# Patient Record
Sex: Female | Born: 1955 | Race: White | Hispanic: No | Smoking: Former smoker
Health system: Southern US, Community
[De-identification: ages and names within clinical notes are randomized; demographics above are authoritative.]

## PROBLEM LIST (undated history)

## (undated) ENCOUNTER — Ambulatory Visit: Admission: EM | Payer: Medicare PPO

## (undated) DIAGNOSIS — T8859XA Other complications of anesthesia, initial encounter: Secondary | ICD-10-CM

## (undated) DIAGNOSIS — Z9889 Other specified postprocedural states: Secondary | ICD-10-CM

## (undated) DIAGNOSIS — E785 Hyperlipidemia, unspecified: Secondary | ICD-10-CM

## (undated) DIAGNOSIS — I10 Essential (primary) hypertension: Secondary | ICD-10-CM

## (undated) DIAGNOSIS — R519 Headache, unspecified: Secondary | ICD-10-CM

## (undated) DIAGNOSIS — K219 Gastro-esophageal reflux disease without esophagitis: Secondary | ICD-10-CM

## (undated) DIAGNOSIS — K589 Irritable bowel syndrome without diarrhea: Secondary | ICD-10-CM

## (undated) DIAGNOSIS — C439 Malignant melanoma of skin, unspecified: Secondary | ICD-10-CM

## (undated) DIAGNOSIS — T4145XA Adverse effect of unspecified anesthetic, initial encounter: Secondary | ICD-10-CM

## (undated) DIAGNOSIS — F419 Anxiety disorder, unspecified: Secondary | ICD-10-CM

## (undated) DIAGNOSIS — C50919 Malignant neoplasm of unspecified site of unspecified female breast: Secondary | ICD-10-CM

## (undated) DIAGNOSIS — E559 Vitamin D deficiency, unspecified: Secondary | ICD-10-CM

## (undated) DIAGNOSIS — R112 Nausea with vomiting, unspecified: Secondary | ICD-10-CM

## (undated) DIAGNOSIS — R51 Headache: Secondary | ICD-10-CM

## (undated) HISTORY — PX: COLONOSCOPY: SHX174

## (undated) HISTORY — DX: Hyperlipidemia, unspecified: E78.5

## (undated) HISTORY — PX: WISDOM TOOTH EXTRACTION: SHX21

## (undated) HISTORY — PX: POLYPECTOMY: SHX149

## (undated) HISTORY — DX: Gastro-esophageal reflux disease without esophagitis: K21.9

## (undated) HISTORY — DX: Malignant melanoma of skin, unspecified: C43.9

## (undated) HISTORY — DX: Irritable bowel syndrome, unspecified: K58.9

## (undated) HISTORY — PX: BREAST SURGERY: SHX581

## (undated) HISTORY — DX: Vitamin D deficiency, unspecified: E55.9

## (undated) HISTORY — DX: Essential (primary) hypertension: I10

## (undated) HISTORY — DX: Anxiety disorder, unspecified: F41.9

---

## 1983-02-10 HISTORY — PX: TUBAL LIGATION: SHX77

## 1989-02-09 HISTORY — PX: OVARIAN CYST REMOVAL: SHX89

## 1990-02-09 HISTORY — PX: ABDOMINAL HYSTERECTOMY: SHX81

## 1993-02-09 HISTORY — PX: OTHER SURGICAL HISTORY: SHX169

## 2004-04-28 ENCOUNTER — Ambulatory Visit: Payer: Self-pay | Admitting: Internal Medicine

## 2007-02-10 HISTORY — PX: SEPTOPLASTY: SHX2393

## 2010-02-09 HISTORY — PX: MOHS SURGERY: SUR867

## 2010-07-31 ENCOUNTER — Ambulatory Visit: Payer: Self-pay

## 2011-08-04 ENCOUNTER — Ambulatory Visit: Payer: Self-pay

## 2012-06-08 ENCOUNTER — Ambulatory Visit (INDEPENDENT_AMBULATORY_CARE_PROVIDER_SITE_OTHER): Payer: BC Managed Care – PPO | Admitting: Internal Medicine

## 2012-06-08 ENCOUNTER — Encounter: Payer: Self-pay | Admitting: Internal Medicine

## 2012-06-08 VITALS — BP 120/80 | HR 76 | Temp 98.3°F | Ht 68.0 in | Wt 199.5 lb

## 2012-06-08 DIAGNOSIS — F411 Generalized anxiety disorder: Secondary | ICD-10-CM

## 2012-06-08 DIAGNOSIS — E7800 Pure hypercholesterolemia, unspecified: Secondary | ICD-10-CM

## 2012-06-08 DIAGNOSIS — E559 Vitamin D deficiency, unspecified: Secondary | ICD-10-CM

## 2012-06-08 DIAGNOSIS — F419 Anxiety disorder, unspecified: Secondary | ICD-10-CM

## 2012-06-08 DIAGNOSIS — I10 Essential (primary) hypertension: Secondary | ICD-10-CM

## 2012-06-08 DIAGNOSIS — C439 Malignant melanoma of skin, unspecified: Secondary | ICD-10-CM

## 2012-06-08 DIAGNOSIS — K219 Gastro-esophageal reflux disease without esophagitis: Secondary | ICD-10-CM

## 2012-06-08 DIAGNOSIS — Z1239 Encounter for other screening for malignant neoplasm of breast: Secondary | ICD-10-CM

## 2012-06-08 DIAGNOSIS — K589 Irritable bowel syndrome without diarrhea: Secondary | ICD-10-CM

## 2012-06-08 DIAGNOSIS — G4733 Obstructive sleep apnea (adult) (pediatric): Secondary | ICD-10-CM

## 2012-06-09 ENCOUNTER — Encounter: Payer: Self-pay | Admitting: Internal Medicine

## 2012-06-09 DIAGNOSIS — G4733 Obstructive sleep apnea (adult) (pediatric): Secondary | ICD-10-CM | POA: Insufficient documentation

## 2012-06-09 DIAGNOSIS — F419 Anxiety disorder, unspecified: Secondary | ICD-10-CM | POA: Insufficient documentation

## 2012-06-09 DIAGNOSIS — E559 Vitamin D deficiency, unspecified: Secondary | ICD-10-CM | POA: Insufficient documentation

## 2012-06-09 DIAGNOSIS — C439 Malignant melanoma of skin, unspecified: Secondary | ICD-10-CM | POA: Insufficient documentation

## 2012-06-09 DIAGNOSIS — K219 Gastro-esophageal reflux disease without esophagitis: Secondary | ICD-10-CM | POA: Insufficient documentation

## 2012-06-09 DIAGNOSIS — K589 Irritable bowel syndrome without diarrhea: Secondary | ICD-10-CM | POA: Insufficient documentation

## 2012-06-09 DIAGNOSIS — I10 Essential (primary) hypertension: Secondary | ICD-10-CM | POA: Insufficient documentation

## 2012-06-09 DIAGNOSIS — E7800 Pure hypercholesterolemia, unspecified: Secondary | ICD-10-CM | POA: Insufficient documentation

## 2012-06-09 NOTE — Assessment & Plan Note (Signed)
Unable to tolerate the mask.  Has tried multiple masks.  Discussed the need to avoid sleeping supine and avoid sedating medication.

## 2012-06-09 NOTE — Assessment & Plan Note (Signed)
Intermittent flares.  Colonoscopy due.  Follow. Can try align daily.

## 2012-06-09 NOTE — Assessment & Plan Note (Signed)
Low cholesterol diet and exercise.  Follow.   

## 2012-06-09 NOTE — Progress Notes (Signed)
Subjective:    Patient ID: Jordan Benson, female    DOB: 04-Sep-1955, 57 y.o.   MRN: [id number]  HPI 57 year old female with past history of hypertension, GERD, anxiety and sleep apnea.  She comes in today to follow up on these issues as well as to establish care.  Works at [employer].  Overall she feel she is doing well.  Tries to stay active.  No cardiac symptoms with increased activity or exertion.  Goes to water aerobics 2-3x/week.  Breathing stable.  Has IBS.  Intermittent flares.  No change.  Her flares - loose stool.  No problems with constipation.  Has sleep apnea.  Not able to tolerate the mask.  Has tried multiple mask.  States has been on Prozac for a long time.  Has done well on this medication.     Past Medical History  Diagnosis Date  . Melanoma     followed by Dr Adolphus Birchwood  . Hypertension   . Sleep apnea   . GERD (gastroesophageal reflux disease)   . IBS (irritable bowel syndrome)   . Anxiety     Outpatient Encounter Prescriptions as of 06/08/2012  Medication Sig Dispense Refill  . beta carotene w/minerals (OCUVITE) tablet Take 1 tablet by mouth daily.      . Calcium Carb-Cholecalciferol (CALCIUM + D3 PO) Take by mouth daily.      . enalapril (VASOTEC) 5 MG tablet Take 5 mg by mouth daily.      Marland Kitchen FLUoxetine (PROZAC) 20 MG capsule Take 20 mg by mouth daily.      Marland Kitchen triamterene-hydrochlorothiazide (MAXZIDE-25) 37.5-25 MG per tablet Take 1 tablet by mouth daily.      . valACYclovir (VALTREX) 1000 MG tablet Take 1,000 mg by mouth as needed.       No facility-administered encounter medications on file as of 06/08/2012.    Review of Systems Patient denies any headache, lightheadedness or dizziness.  No sinus or allergy symptoms.  No chest pain, tightness or palpitations.  No increased shortness of breath, cough or congestion.  Breathing stable.  No nausea or vomiting.  Acid reflux controlled.  No abdominal pain or cramping.  No bowel change.  Has intermittent flares of IBS as outlined.   No BRBPR or melana.  No urine change. Anxiety controlled with Prozac.  Has sleep apnea.  Not able to tolerate the mask.  Has tried multiple masks.  Has had surgery for deviated septum.         Objective:   Physical Exam Filed Vitals:   06/08/12 1024  BP: 120/80  Pulse: 76  Temp: 98.3 F (66.86 C)   57 year old female in no acute distress.   HEENT:  Nares- clear.  Oropharynx - without lesions. NECK:  Supple.  Nontender.  No audible bruit.  HEART:  Appears to be regular. LUNGS:  No crackles or wheezing audible.  Respirations even and unlabored.  RADIAL PULSE:  Equal bilaterally.    ABDOMEN:  Soft, nontender.  Bowel sounds present and normal.  No audible abdominal bruit.   EXTREMITIES:  No increased edema present.  DP pulses palpable and equal bilaterally.      SKIN:  No rash.       Assessment & Plan:  HEALTH MAINTENANCE.  Schedule a physical for next visit.  Had a colonoscopy at age 33.  Discussed need for f/u colonoscopy since her sister had pre cancerous polyps.  Should get a colonoscopy every five years.  Last mammogram 07/2011.  Schedule  follow up mammogram.    I spent 45 minutes with this patient and more than 50% of the time was spent in consultation regarding the above.

## 2012-06-09 NOTE — Assessment & Plan Note (Signed)
Blood pressure under good control.  On Enalapril.  Check metabolic panel.

## 2012-06-09 NOTE — Assessment & Plan Note (Signed)
Reflux controlled.  Follow.   

## 2012-06-09 NOTE — Assessment & Plan Note (Signed)
Continue vitamin D supplements.  Follow.   

## 2012-06-09 NOTE — Assessment & Plan Note (Signed)
Followed by Dr Dasher.  Doing well.  Follow.   

## 2012-06-09 NOTE — Assessment & Plan Note (Signed)
On prozac.  Doing well.  Follow.   

## 2012-07-15 ENCOUNTER — Telehealth: Payer: Self-pay | Admitting: Internal Medicine

## 2012-07-15 MED ORDER — FLUOXETINE HCL 20 MG PO CAPS: 20.0000 mg | ORAL_CAPSULE | Freq: Every day | ORAL | Status: DC

## 2012-07-15 MED ORDER — TRIAMTERENE-HCTZ 37.5-25 MG PO TABS: 1.0000 | ORAL_TABLET | Freq: Every day | ORAL | Status: DC

## 2012-07-15 MED ORDER — ENALAPRIL MALEATE 5 MG PO TABS: 5.0000 mg | ORAL_TABLET | Freq: Every day | ORAL | Status: DC

## 2012-07-15 NOTE — Telephone Encounter (Signed)
Pt aware that meds have been refilled & to call the pharmacy in the future to request refills in advance

## 2012-07-15 NOTE — Telephone Encounter (Signed)
Patient wants all 3 of her prescriptions sent to [facility] pharmacy . She stated that the Dr. Ronnald Nian which prescriptions needed to be refilled.

## 2012-09-07 ENCOUNTER — Ambulatory Visit: Payer: Self-pay | Admitting: Internal Medicine

## 2012-09-09 ENCOUNTER — Encounter: Payer: BC Managed Care – PPO | Admitting: Internal Medicine

## 2012-09-11 ENCOUNTER — Encounter: Payer: Self-pay | Admitting: Internal Medicine

## 2012-09-19 ENCOUNTER — Encounter: Payer: Self-pay | Admitting: Internal Medicine

## 2012-09-20 ENCOUNTER — Encounter: Payer: BC Managed Care – PPO | Admitting: Internal Medicine

## 2012-09-27 ENCOUNTER — Encounter: Payer: Self-pay | Admitting: Internal Medicine

## 2012-09-27 ENCOUNTER — Ambulatory Visit (INDEPENDENT_AMBULATORY_CARE_PROVIDER_SITE_OTHER): Payer: BC Managed Care – PPO | Admitting: Internal Medicine

## 2012-09-27 VITALS — BP 118/80 | HR 75 | Temp 98.0°F | Ht 68.1 in | Wt 197.2 lb

## 2012-09-27 DIAGNOSIS — E559 Vitamin D deficiency, unspecified: Secondary | ICD-10-CM

## 2012-09-27 DIAGNOSIS — Z8371 Family history of colonic polyps: Secondary | ICD-10-CM

## 2012-09-27 DIAGNOSIS — Z1211 Encounter for screening for malignant neoplasm of colon: Secondary | ICD-10-CM

## 2012-09-27 DIAGNOSIS — K219 Gastro-esophageal reflux disease without esophagitis: Secondary | ICD-10-CM

## 2012-09-27 DIAGNOSIS — F411 Generalized anxiety disorder: Secondary | ICD-10-CM

## 2012-09-27 DIAGNOSIS — K589 Irritable bowel syndrome without diarrhea: Secondary | ICD-10-CM

## 2012-09-27 DIAGNOSIS — F419 Anxiety disorder, unspecified: Secondary | ICD-10-CM

## 2012-09-27 DIAGNOSIS — C439 Malignant melanoma of skin, unspecified: Secondary | ICD-10-CM

## 2012-09-27 DIAGNOSIS — I10 Essential (primary) hypertension: Secondary | ICD-10-CM

## 2012-09-27 DIAGNOSIS — G4733 Obstructive sleep apnea (adult) (pediatric): Secondary | ICD-10-CM

## 2012-09-27 DIAGNOSIS — E7800 Pure hypercholesterolemia, unspecified: Secondary | ICD-10-CM

## 2012-09-27 NOTE — Assessment & Plan Note (Signed)
Low cholesterol diet and exercise.  Follow.   

## 2012-09-27 NOTE — Assessment & Plan Note (Signed)
Followed by Dr Dasher.  Doing well.  Follow.   

## 2012-09-27 NOTE — Assessment & Plan Note (Signed)
Unable to tolerate the mask.  Has tried multiple masks.  Discussed the need to avoid sleeping supine and avoid sedating medication.

## 2012-09-27 NOTE — Progress Notes (Signed)
Subjective:    Patient ID: Jordan Benson, female    DOB: Oct 06, 1955, 57 y.o.   MRN: [id number]  HPI 57 year old female with past history of hypertension, GERD, anxiety and sleep apnea.  She comes in today to follow up on these issues as well as for a complete physical exam.  Works at [employer].  Overall she feel she is doing well.  Tries to stay active.  No cardiac symptoms with increased activity or exertion.  Goes to water aerobics 2-3x/week.  Breathing stable.  Has IBS.  Intermittent flares.  No change.  Her flares - loose stool.  No problems with constipation.  Has sleep apnea.  Not able to tolerate the mask.  Has tried multiple masks.  States has been on Prozac for a long time.  Has done well on this medication.  Overall she feels she is doing well.     Past Medical History  Diagnosis Date  . Melanoma     followed by Dr Adolphus Birchwood  . Hypertension   . Sleep apnea   . GERD (gastroesophageal reflux disease)   . IBS (irritable bowel syndrome)   . Anxiety   . Vitamin D deficiency     Outpatient Encounter Prescriptions as of 09/27/2012  Medication Sig Dispense Refill  . beta carotene w/minerals (OCUVITE) tablet Take 1 tablet by mouth daily.      . Calcium Carb-Cholecalciferol (CALCIUM + D3 PO) Take by mouth daily.      . enalapril (VASOTEC) 5 MG tablet Take 1 tablet (5 mg total) by mouth daily.  30 tablet  5  . FLUoxetine (PROZAC) 20 MG capsule Take 1 capsule (20 mg total) by mouth daily.  30 capsule  2  . triamterene-hydrochlorothiazide (MAXZIDE-25) 37.5-25 MG per tablet Take 1 tablet by mouth daily.  30 tablet  5  . valACYclovir (VALTREX) 1000 MG tablet Take 1,000 mg by mouth as needed.       No facility-administered encounter medications on file as of 09/27/2012.    Review of Systems Patient denies any headache, lightheadedness or dizziness.  No sinus or allergy symptoms.  No chest pain, tightness or palpitations.  No increased shortness of breath, cough or congestion.  Breathing stable.   No nausea or vomiting.  Acid reflux controlled.  No abdominal pain or cramping.  No bowel change.  Has intermittent flares of IBS as outlined.  Overall feels bowels stable.  Discussed the need for colonoscopy.  No BRBPR or melana.  No urine change. Anxiety controlled with Prozac.  Has sleep apnea.  Not able to tolerate the mask.  Has tried multiple masks.  Has had surgery for deviated septum.         Objective:   Physical Exam  Filed Vitals:   09/27/12 0805  BP: 118/80  Pulse: 75  Temp: 98 F (36.7 C)   Blood pressure recheck:  60/38  57 year old female in no acute distress.   HEENT:  Nares- clear.  Oropharynx - without lesions. NECK:  Supple.  Nontender.  No audible bruit.  HEART:  Appears to be regular. LUNGS:  No crackles or wheezing audible.  Respirations even and unlabored.  RADIAL PULSE:  Equal bilaterally.    BREASTS:  No nipple discharge or nipple retraction present.  Could not appreciate any distinct nodules or axillary adenopathy.  ABDOMEN:  Soft, nontender.  Bowel sounds present and normal.  No audible abdominal bruit.  GU:  Is s/p hysterectomy.  Not performed.   EXTREMITIES:  No  increased edema present.  DP pulses palpable and equal bilaterally.          Assessment & Plan:  HEALTH MAINTENANCE.  Physical today.   Had a colonoscopy at age 19.  Discussed need for f/u colonoscopy since her sister had pre cancerous polyps.  Should get a colonoscopy every five years.  Discussed with her today.  She will notify me when agreeable.  Last mammogram 09/07/12 - Birads II.

## 2012-09-27 NOTE — Assessment & Plan Note (Signed)
Blood pressure under good control.  On Enalapril.  Check metabolic panel.

## 2012-09-27 NOTE — Assessment & Plan Note (Signed)
Reflux controlled.  Follow.   

## 2012-09-27 NOTE — Assessment & Plan Note (Signed)
Continue vitamin D supplements.  Follow.   

## 2012-09-29 ENCOUNTER — Encounter: Payer: Self-pay | Admitting: Internal Medicine

## 2012-09-29 DIAGNOSIS — Z8371 Family history of colonic polyps: Secondary | ICD-10-CM | POA: Insufficient documentation

## 2012-09-29 MED ORDER — TRIAMTERENE-HCTZ 37.5-25 MG PO TABS: 1.0000 | ORAL_TABLET | Freq: Every day | ORAL | Status: DC

## 2012-09-29 MED ORDER — FLUOXETINE HCL 20 MG PO CAPS: 20.0000 mg | ORAL_CAPSULE | Freq: Every day | ORAL | Status: DC

## 2012-09-29 MED ORDER — ENALAPRIL MALEATE 5 MG PO TABS: 5.0000 mg | ORAL_TABLET | Freq: Every day | ORAL | Status: DC

## 2012-09-29 NOTE — Assessment & Plan Note (Addendum)
Had a colonoscopy at age 57.  Discussed the need for colonoscopy q five years - given family history.  Will notify me when agreeable.  IFOB given today.

## 2012-09-29 NOTE — Assessment & Plan Note (Signed)
Controlled.  Stable.  On Prozac.

## 2012-09-29 NOTE — Assessment & Plan Note (Signed)
Bowels stable.  

## 2012-10-06 ENCOUNTER — Other Ambulatory Visit (INDEPENDENT_AMBULATORY_CARE_PROVIDER_SITE_OTHER): Payer: BC Managed Care – PPO

## 2012-10-06 DIAGNOSIS — Z1211 Encounter for screening for malignant neoplasm of colon: Secondary | ICD-10-CM

## 2012-12-15 ENCOUNTER — Other Ambulatory Visit: Payer: Self-pay

## 2013-03-31 ENCOUNTER — Ambulatory Visit: Payer: BC Managed Care – PPO | Admitting: Internal Medicine

## 2013-04-05 ENCOUNTER — Ambulatory Visit: Payer: BC Managed Care – PPO | Admitting: Internal Medicine

## 2013-04-26 ENCOUNTER — Encounter: Payer: Self-pay | Admitting: Internal Medicine

## 2013-04-26 ENCOUNTER — Ambulatory Visit (INDEPENDENT_AMBULATORY_CARE_PROVIDER_SITE_OTHER): Payer: BC Managed Care – PPO | Admitting: Internal Medicine

## 2013-04-26 VITALS — BP 120/80 | HR 75 | Temp 98.3°F | Ht 68.1 in | Wt 198.2 lb

## 2013-04-26 DIAGNOSIS — F411 Generalized anxiety disorder: Secondary | ICD-10-CM

## 2013-04-26 DIAGNOSIS — E559 Vitamin D deficiency, unspecified: Secondary | ICD-10-CM

## 2013-04-26 DIAGNOSIS — K219 Gastro-esophageal reflux disease without esophagitis: Secondary | ICD-10-CM

## 2013-04-26 DIAGNOSIS — F419 Anxiety disorder, unspecified: Secondary | ICD-10-CM

## 2013-04-26 DIAGNOSIS — C439 Malignant melanoma of skin, unspecified: Secondary | ICD-10-CM

## 2013-04-26 DIAGNOSIS — I10 Essential (primary) hypertension: Secondary | ICD-10-CM

## 2013-04-26 DIAGNOSIS — E7800 Pure hypercholesterolemia, unspecified: Secondary | ICD-10-CM

## 2013-04-26 DIAGNOSIS — Z8371 Family history of colonic polyps: Secondary | ICD-10-CM

## 2013-04-26 DIAGNOSIS — G4733 Obstructive sleep apnea (adult) (pediatric): Secondary | ICD-10-CM

## 2013-04-26 MED ORDER — ALPRAZOLAM 0.25 MG PO TABS: 0.2500 mg | ORAL_TABLET | Freq: Every day | ORAL | Status: DC | PRN

## 2013-04-26 NOTE — Assessment & Plan Note (Addendum)
Blood pressure under good control.  On Enalapril.  Follow metabolic panel.   

## 2013-04-26 NOTE — Progress Notes (Signed)
Subjective:    Patient ID: Jordan Benson, female    DOB: [birth year], 58 y.o.   MRN: [id number]  HPI 58 year old female with past history of hypertension, GERD, anxiety and sleep apnea.  She comes in today for a scheduled follow up.  Works at [employer].  Overall she feel she is doing well.  Tries to stay active.  No cardiac symptoms with increased activity or exertion.  Goes to water aerobics 2-3x/week.  Breathing stable.  Has IBS.  Intermittent flares.  No change.  No problems with constipation.  Has sleep apnea.  Not able to tolerate the mask previously.   Has tried multiple masks.  Is planning for another sleep study next month.  She is more motivated to try the mask again.  Under increased stress with her husbands health issues.  States has been on Prozac for a long time.  Has done well on this medication.  Overall she feels she is handling things relatively well.  Feels she needs something to help intermittently calm herself down.   Has taken alprazolam in the past.     Past Medical History  Diagnosis Date  . Melanoma     followed by Dr Evorn Gong  . Hypertension   . Sleep apnea   . GERD (gastroesophageal reflux disease)   . IBS (irritable bowel syndrome)   . Anxiety   . Vitamin D deficiency     Outpatient Encounter Prescriptions as of 04/26/2013  Medication Sig  . beta carotene w/minerals (OCUVITE) tablet Take 1 tablet by mouth daily.  . Calcium Carb-Cholecalciferol (CALCIUM + D3 PO) Take by mouth daily.  . enalapril (VASOTEC) 5 MG tablet Take 1 tablet (5 mg total) by mouth daily.  Marland Kitchen FLUoxetine (PROZAC) 20 MG capsule Take 1 capsule (20 mg total) by mouth daily.  Marland Kitchen triamterene-hydrochlorothiazide (MAXZIDE-25) 37.5-25 MG per tablet Take 1 tablet by mouth daily.  . valACYclovir (VALTREX) 1000 MG tablet Take 1,000 mg by mouth as needed.    Review of Systems Patient denies any headache, lightheadedness or dizziness.  No sinus or allergy symptoms.  No chest pain, tightness or palpitations.  No  increased shortness of breath, cough or congestion.  Breathing stable.  No nausea or vomiting.  Acid reflux controlled.  No abdominal pain or cramping.  No bowel change.  Has intermittent flares of IBS as outlined.  Overall feels bowels stable.  Discussed the need for colonoscopy.  No BRBPR or melana.  No urine change.  Has sleep apnea.  Not able to tolerate the mask.  Has tried multiple masks.  Has had surgery for deviated septum.  Planning for another sleep study next month.  States she is more motivated to try the mask again.  Increased stress as outlined.  Doing relatively well with the prozac.  Feels she needs something to take prn.        Objective:   Physical Exam  Filed Vitals:   04/26/13 0821  BP: 120/80  Pulse: 75  Temp: 98.3 F (36.8 C)   Blood pressure recheck:  47/47  58 year old female in no acute distress.   HEENT:  Nares- clear.  Oropharynx - without lesions. NECK:  Supple.  Nontender.  No audible bruit.  HEART:  Appears to be regular. LUNGS:  No crackles or wheezing audible.  Respirations even and unlabored.  RADIAL PULSE:  Equal bilaterally.  ABDOMEN:  Soft, nontender.  Bowel sounds present and normal.  No audible abdominal bruit.  EXTREMITIES:  No increased edema  present.  DP pulses palpable and equal bilaterally.          Assessment & Plan:  HEALTH MAINTENANCE.  Physical 09/27/12.   Had a colonoscopy at age 49.  Discussed need for f/u colonoscopy since her sister had pre cancerous polyps.  Should get a colonoscopy every five years.  Have discussed with her regarding the need for f/u colonoscopy.   She will notify me when agreeable.  IFOB negative 8/14.  Last mammogram 09/07/12 - Birads II.

## 2013-04-26 NOTE — Progress Notes (Signed)
Pre-visit discussion using our clinic review tool. No additional management support is needed unless otherwise documented below in the visit note.  

## 2013-04-26 NOTE — Assessment & Plan Note (Addendum)
Low cholesterol diet and exercise.  Follow.   

## 2013-04-26 NOTE — Assessment & Plan Note (Signed)
Had a colonoscopy at age 58.  Discussed the need for colonoscopy q five years - given family history.  Will notify me when agreeable.  IFOB 8/14 negative.

## 2013-04-26 NOTE — Assessment & Plan Note (Signed)
Reflux controlled.  Follow.   

## 2013-04-26 NOTE — Assessment & Plan Note (Signed)
Followed by Dr Evorn Gong.  Doing well.  Follow.

## 2013-04-26 NOTE — Assessment & Plan Note (Addendum)
Unable to tolerate the mask.  Has tried multiple masks.  Discussed the need to avoid sleeping supine and avoid sedating medication.  She is going to have a f/u sleep study next month.

## 2013-04-26 NOTE — Assessment & Plan Note (Addendum)
Continue vitamin D supplements.  Follow.   

## 2013-04-26 NOTE — Assessment & Plan Note (Addendum)
Controlled.  Stable.  On Prozac.  Feels needs something intermittently to help control.  Will prescribe xanax .25mg  prn.  Instructed not to take these just prior to bed - until she gets treatment for her sleep apnea.

## 2013-04-28 ENCOUNTER — Other Ambulatory Visit: Payer: Self-pay | Admitting: Internal Medicine

## 2013-04-30 ENCOUNTER — Encounter: Payer: Self-pay | Admitting: Internal Medicine

## 2013-06-06 ENCOUNTER — Encounter: Payer: Self-pay | Admitting: Internal Medicine

## 2013-06-20 NOTE — Telephone Encounter (Signed)
Unread mychart message mailed to patient 

## 2013-07-18 ENCOUNTER — Other Ambulatory Visit: Payer: Self-pay | Admitting: Internal Medicine

## 2013-07-18 MED ORDER — FLUOXETINE HCL 20 MG PO CAPS: ORAL_CAPSULE | ORAL | Status: DC

## 2013-07-18 MED ORDER — TRIAMTERENE-HCTZ 37.5-25 MG PO TABS: 1.0000 | ORAL_TABLET | Freq: Every day | ORAL | Status: DC

## 2013-07-18 MED ORDER — ALPRAZOLAM 0.25 MG PO TABS: 0.2500 mg | ORAL_TABLET | Freq: Every day | ORAL | Status: DC | PRN

## 2013-07-18 NOTE — Telephone Encounter (Signed)
Refilled xanax #30 with no refills.  Signed and placed on your desk.

## 2013-07-18 NOTE — Telephone Encounter (Signed)
Ok refill? 

## 2013-07-20 ENCOUNTER — Other Ambulatory Visit: Payer: Self-pay | Admitting: Internal Medicine

## 2013-09-27 ENCOUNTER — Ambulatory Visit: Payer: Self-pay | Admitting: Internal Medicine

## 2013-09-29 ENCOUNTER — Encounter: Payer: BC Managed Care – PPO | Admitting: Internal Medicine

## 2013-09-29 ENCOUNTER — Encounter: Payer: Self-pay | Admitting: Internal Medicine

## 2013-10-01 ENCOUNTER — Encounter: Payer: Self-pay | Admitting: Internal Medicine

## 2013-10-03 ENCOUNTER — Encounter: Payer: Self-pay | Admitting: Internal Medicine

## 2013-10-03 ENCOUNTER — Ambulatory Visit (INDEPENDENT_AMBULATORY_CARE_PROVIDER_SITE_OTHER): Payer: BC Managed Care – PPO | Admitting: Internal Medicine

## 2013-10-03 VITALS — BP 130/80 | HR 68 | Temp 98.4°F | Ht 68.0 in | Wt 196.8 lb

## 2013-10-03 DIAGNOSIS — C439 Malignant melanoma of skin, unspecified: Secondary | ICD-10-CM

## 2013-10-03 DIAGNOSIS — E7800 Pure hypercholesterolemia, unspecified: Secondary | ICD-10-CM

## 2013-10-03 DIAGNOSIS — K219 Gastro-esophageal reflux disease without esophagitis: Secondary | ICD-10-CM

## 2013-10-03 DIAGNOSIS — Z83719 Family history of colon polyps, unspecified: Secondary | ICD-10-CM

## 2013-10-03 DIAGNOSIS — Z8371 Family history of colonic polyps: Secondary | ICD-10-CM

## 2013-10-03 DIAGNOSIS — G4733 Obstructive sleep apnea (adult) (pediatric): Secondary | ICD-10-CM

## 2013-10-03 DIAGNOSIS — F419 Anxiety disorder, unspecified: Secondary | ICD-10-CM

## 2013-10-03 DIAGNOSIS — K589 Irritable bowel syndrome without diarrhea: Secondary | ICD-10-CM

## 2013-10-03 DIAGNOSIS — I10 Essential (primary) hypertension: Secondary | ICD-10-CM

## 2013-10-03 DIAGNOSIS — F411 Generalized anxiety disorder: Secondary | ICD-10-CM

## 2013-10-03 DIAGNOSIS — E559 Vitamin D deficiency, unspecified: Secondary | ICD-10-CM

## 2013-10-03 MED ORDER — TRIAMTERENE-HCTZ 37.5-25 MG PO TABS
1.0000 | ORAL_TABLET | Freq: Every day | ORAL | Status: DC
Start: 2013-10-03 — End: 2014-04-05

## 2013-10-03 MED ORDER — VALACYCLOVIR HCL 1 G PO TABS: 1000.0000 mg | ORAL_TABLET | ORAL | Status: DC | PRN

## 2013-10-03 MED ORDER — FLUOXETINE HCL 20 MG PO CAPS: ORAL_CAPSULE | ORAL | Status: DC

## 2013-10-03 MED ORDER — ALPRAZOLAM 0.25 MG PO TABS: 0.2500 mg | ORAL_TABLET | Freq: Every day | ORAL | Status: DC | PRN

## 2013-10-03 MED ORDER — ENALAPRIL MALEATE 5 MG PO TABS: 5.0000 mg | ORAL_TABLET | Freq: Every day | ORAL | Status: DC

## 2013-10-03 NOTE — Progress Notes (Signed)
Pre visit review using our clinic review tool, if applicable. No additional management support is needed unless otherwise documented below in the visit note. 

## 2013-10-03 NOTE — Progress Notes (Signed)
Subjective:    Patient ID: Jordan Benson, female    DOB: September 30, 1955, 58 y.o.   MRN: [id number]  HPI 58 year old female with past history of hypertension, GERD, anxiety and sleep apnea.  She comes in today to follow up on these issues as well as for a complete physical exam.   Works at [employer].  Overall she feel she is doing well.  Tries to stay active.  No cardiac symptoms with increased activity or exertion.  Goes to water aerobics 2-3x/week. Exercises regularly.  Watching her diet.  Has lost a couple of more pounds.  Breathing stable.  Has IBS.  Intermittent flares.  No change.  No problems with constipation.  Overall feels stable.  Had been diagnosed with sleep apnea and initially was not able to tolerate the mask.  Had a f/u sleep study and this did not reveal sleep apnea.   She states she feels rested when she awakens.   Under increased stress with her father's health issues.  States has been on Prozac for a long time.  Has done well on this medication.  Overall she feels she is handling things relatively well.  Planning to go to family counseling with her sister.  This is already scheduled.      Past Medical History  Diagnosis Date  . Melanoma     followed by Dr Evorn Gong  . Hypertension   . Sleep apnea   . GERD (gastroesophageal reflux disease)   . IBS (irritable bowel syndrome)   . Anxiety   . Vitamin D deficiency     Outpatient Encounter Prescriptions as of 10/03/2013  Medication Sig  . ALPRAZolam (XANAX) 0.25 MG tablet Take 1 tablet (0.25 mg total) by mouth daily as needed for anxiety.  . beta carotene w/minerals (OCUVITE) tablet Take 1 tablet by mouth daily.  . Calcium Carb-Cholecalciferol (CALCIUM + D3 PO) Take by mouth daily.  . enalapril (VASOTEC) 5 MG tablet Take 1 tablet (5 mg total) by mouth daily.  Marland Kitchen FLUoxetine (PROZAC) 20 MG capsule TAKE 1 CAPSULE (20 MG TOTAL) BY MOUTH DAILY.  Marland Kitchen triamterene-hydrochlorothiazide (MAXZIDE-25) 37.5-25 MG per tablet Take 1 tablet by mouth  daily.  . valACYclovir (VALTREX) 1000 MG tablet Take 1,000 mg by mouth as needed.  . [DISCONTINUED] enalapril (VASOTEC) 5 MG tablet TAKE 1 TABLET (5 MG TOTAL) BY MOUTH DAILY.    Review of Systems Patient denies any headache, lightheadedness or dizziness.  No sinus or allergy symptoms.  No chest pain, tightness or palpitations.  No increased shortness of breath, cough or congestion.  Breathing stable.  No nausea or vomiting.  Acid reflux controlled.  No abdominal pain or cramping.  No bowel change.  Has intermittent flares of IBS as outlined.  Overall feels bowels stable.  No BRBPR or melana.  She is scheduled for colonoscopy in 11/15.   Increased stress as outlined.  Doing relatively well with the prozac.  Planning to attend family counseling.  F/u sleep study - no sleep apnea.         Objective:   Physical Exam  Filed Vitals:   10/03/13 0835  BP: 130/80  Pulse: 68  Temp: 98.4 F (36.9 C)   Blood pressure recheck:  21/85  58 year old female in no acute distress.   HEENT:  Nares- clear.  Oropharynx - without lesions. NECK:  Supple.  Nontender.  No audible bruit.  HEART:  Appears to be regular. LUNGS:  No crackles or wheezing audible.  Respirations even  and unlabored.  RADIAL PULSE:  Equal bilaterally.    BREASTS:  No nipple discharge or nipple retraction present.  Could not appreciate any distinct nodules or axillary adenopathy.  ABDOMEN:  Soft, nontender.  Bowel sounds present and normal.  No audible abdominal bruit.  GU:  Not performed.     EXTREMITIES:  No increased edema present.  DP pulses palpable and equal bilaterally.         Assessment & Plan:  HEALTH MAINTENANCE.  Physical today.   Is s/p hysterectomy and does not require pap smear.  Mammogram 09/27/13 - Birads I.  Planning for colonoscopy in 11/15.    I spent 25 minutes with the patient and more than 50% of the time was spent in consultation regarding the above.

## 2013-10-06 ENCOUNTER — Encounter: Payer: Self-pay | Admitting: Internal Medicine

## 2013-10-06 NOTE — Assessment & Plan Note (Signed)
Followed by Dr Evorn Gong.  Doing well.  Follow.  Have her discuss with him regarding question of need for cxr's.

## 2013-10-06 NOTE — Assessment & Plan Note (Signed)
Low cholesterol diet and exercise.  Follow.   

## 2013-10-06 NOTE — Assessment & Plan Note (Signed)
Blood pressure under good control.  On Enalapril.  Follow metabolic panel.

## 2013-10-06 NOTE — Assessment & Plan Note (Signed)
Bowels stable.  

## 2013-10-06 NOTE — Assessment & Plan Note (Signed)
Follow up sleep study recently revealed no sleep apnea.  Follow.

## 2013-10-06 NOTE — Assessment & Plan Note (Signed)
Increased stress with her father's issues.  On Prozac.  Planning to attend family counseling with her sister.  Follow.  Does not feel she needs anything more at this time.

## 2013-10-06 NOTE — Assessment & Plan Note (Signed)
Continue vitamin D supplements.  Follow.   

## 2013-10-06 NOTE — Assessment & Plan Note (Signed)
Reflux controlled.  Follow.

## 2013-10-06 NOTE — Assessment & Plan Note (Signed)
Had a colonoscopy at age 58.  Has f/u colonoscopy scheduled for 11/15.

## 2013-12-29 LAB — BASIC METABOLIC PANEL
BUN: 22 mg/dL — AB (ref 4–21)
Creatinine: 0.9 mg/dL (ref 0.5–1.1)
Glucose: 103 mg/dL
Potassium: 4.6 mmol/L (ref 3.4–5.3)
Sodium: 139 mmol/L (ref 137–147)

## 2013-12-29 LAB — HEPATIC FUNCTION PANEL
ALK PHOS: 66 U/L (ref 25–125)
ALT: 24 U/L (ref 7–35)
AST: 22 U/L (ref 13–35)
Bilirubin, Total: 1.1 mg/dL

## 2013-12-29 LAB — LIPID PANEL
CHOLESTEROL: 217 mg/dL — AB (ref 0–200)
HDL: 57 mg/dL (ref 35–70)
LDL Cholesterol: 132 mg/dL
Triglycerides: 140 mg/dL (ref 40–160)

## 2013-12-29 LAB — CBC AND DIFFERENTIAL
HEMATOCRIT: 41 % (ref 36–46)
HEMOGLOBIN: 14.1 g/dL (ref 12.0–16.0)
Neutrophils Absolute: 3 /uL
PLATELETS: 334 10*3/uL (ref 150–399)
WBC: 5.5 10*3/mL

## 2013-12-29 LAB — TSH: TSH: 2.36 u[IU]/mL (ref 0.41–5.90)

## 2014-01-02 ENCOUNTER — Encounter: Payer: Self-pay | Admitting: Internal Medicine

## 2014-01-03 ENCOUNTER — Telehealth: Payer: Self-pay | Admitting: Internal Medicine

## 2014-01-03 NOTE — Telephone Encounter (Signed)
Pt notified of lab results via mychart. 

## 2014-01-09 ENCOUNTER — Other Ambulatory Visit: Payer: Self-pay | Admitting: Internal Medicine

## 2014-01-09 NOTE — Telephone Encounter (Signed)
Refilled xanax #30 with no refills.

## 2014-01-09 NOTE — Telephone Encounter (Signed)
Electronic Rx request for Xanax r

## 2014-01-09 NOTE — Telephone Encounter (Signed)
Electronic Rx request for Xanax received. Medication last filled 10/03/13 and patient's last office visit was 10/03/13. Please advise.

## 2014-01-09 NOTE — Telephone Encounter (Signed)
Unread mychart message mailed to patient 

## 2014-01-10 NOTE — Telephone Encounter (Signed)
Rx faxed to pharmacy per triage.

## 2014-01-17 ENCOUNTER — Encounter: Payer: Self-pay | Admitting: Internal Medicine

## 2014-03-17 LAB — HM COLONOSCOPY

## 2014-04-05 ENCOUNTER — Ambulatory Visit (INDEPENDENT_AMBULATORY_CARE_PROVIDER_SITE_OTHER): Payer: BC Managed Care – PPO | Admitting: Internal Medicine

## 2014-04-05 ENCOUNTER — Encounter: Payer: Self-pay | Admitting: Internal Medicine

## 2014-04-05 VITALS — BP 120/80 | HR 72 | Temp 98.1°F | Ht 68.0 in | Wt 197.5 lb

## 2014-04-05 DIAGNOSIS — Z0000 Encounter for general adult medical examination without abnormal findings: Secondary | ICD-10-CM

## 2014-04-05 DIAGNOSIS — Z8371 Family history of colonic polyps: Secondary | ICD-10-CM

## 2014-04-05 DIAGNOSIS — C439 Malignant melanoma of skin, unspecified: Secondary | ICD-10-CM

## 2014-04-05 DIAGNOSIS — K219 Gastro-esophageal reflux disease without esophagitis: Secondary | ICD-10-CM

## 2014-04-05 DIAGNOSIS — I10 Essential (primary) hypertension: Secondary | ICD-10-CM

## 2014-04-05 DIAGNOSIS — E7800 Pure hypercholesterolemia, unspecified: Secondary | ICD-10-CM

## 2014-04-05 DIAGNOSIS — F419 Anxiety disorder, unspecified: Secondary | ICD-10-CM

## 2014-04-05 DIAGNOSIS — K589 Irritable bowel syndrome without diarrhea: Secondary | ICD-10-CM

## 2014-04-05 DIAGNOSIS — E559 Vitamin D deficiency, unspecified: Secondary | ICD-10-CM

## 2014-04-05 MED ORDER — FLUOXETINE HCL 20 MG PO CAPS: ORAL_CAPSULE | ORAL | Status: DC

## 2014-04-05 MED ORDER — ENALAPRIL MALEATE 5 MG PO TABS: 5.0000 mg | ORAL_TABLET | Freq: Every day | ORAL | Status: DC

## 2014-04-05 MED ORDER — ALPRAZOLAM 0.25 MG PO TABS: 0.2500 mg | ORAL_TABLET | Freq: Every day | ORAL | Status: DC | PRN

## 2014-04-05 MED ORDER — TRIAMTERENE-HCTZ 37.5-25 MG PO TABS
1.0000 | ORAL_TABLET | Freq: Every day | ORAL | Status: DC
Start: 2014-04-05 — End: 2015-04-22

## 2014-04-05 NOTE — Assessment & Plan Note (Signed)
Vitamin D level 12/28/13 - wnl (39).  Follow.

## 2014-04-05 NOTE — Assessment & Plan Note (Signed)
Increased stress with her father's medical issues and behavior toward her.  On prozac and doing well.  Takes xanax prn.  Follow.  Does not feel she needs anything more at this point.  Has good support.

## 2014-04-05 NOTE — Assessment & Plan Note (Signed)
Low cholesterol diet and exercise.  Check lipid panel with next fasting labs.    

## 2014-04-05 NOTE — Assessment & Plan Note (Signed)
Blood pressure doing well.  Continue same medication regimen.  Follow met b.

## 2014-04-05 NOTE — Progress Notes (Signed)
Pre visit review using our clinic review tool, if applicable. No additional management support is needed unless otherwise documented below in the visit note. 

## 2014-04-05 NOTE — Progress Notes (Signed)
Patient ID: Jordan Benson, female   DOB: [birth year], 59 y.o.   MRN: [id number]   Subjective:    Patient ID: Jordan Benson, female    DOB: 19-Jan-1956, 59 y.o.   MRN: [id number]  HPI  Patient here for a scheduled follow up.  She has a history of hypertension, OSA, GERD and hypercholesterolemia.  Still under increased stress with her father's medical issues and his behavior to her.  She feels she is handling things relatively well.  Stays active. No cardiac symptoms with increased activity and exertion.  Breathing stable.  Went to [place] recently.  Developed a cold.  Better now. Minimal residual cough that she attributes to allergies.     Past Medical History  Diagnosis Date  . Melanoma     followed by Dr Jordan Benson  . Hypertension   . Sleep apnea   . GERD (gastroesophageal reflux disease)   . IBS (irritable bowel syndrome)   . Anxiety   . Vitamin D deficiency     Current Outpatient Prescriptions on File Prior to Visit  Medication Sig Dispense Refill  . beta carotene w/minerals (OCUVITE) tablet Take 1 tablet by mouth daily.    . Calcium Carb-Cholecalciferol (CALCIUM + D3 PO) Take by mouth daily.    . valACYclovir (VALTREX) 1000 MG tablet Take 1 tablet (1,000 mg total) by mouth as needed. 30 tablet 1   No current facility-administered medications on file prior to visit.    Review of Systems  Constitutional: Negative for appetite change and unexpected weight change.  HENT: Negative for congestion, sinus pressure and sore throat.   Respiratory: Positive for cough (minimal residual cough.  ). Negative for chest tightness and shortness of breath.   Cardiovascular: Negative for chest pain, palpitations and leg swelling.  Gastrointestinal: Negative for nausea, vomiting, abdominal pain and diarrhea.  Neurological: Negative for dizziness, light-headedness and headaches.       Objective:     Blood pressure recheck:  124/82  Physical Exam  Constitutional: She appears  well-developed and well-nourished. No distress.  HENT:  Nose: Nose normal.  Mouth/Throat: Oropharynx is clear and moist.  Neck: Neck supple. No thyromegaly present.  Cardiovascular: Normal rate and regular rhythm.   Pulmonary/Chest: Breath sounds normal. No respiratory distress. She has no wheezes.  Abdominal: Soft. Bowel sounds are normal. There is no tenderness.  Musculoskeletal: She exhibits no edema or tenderness.  Lymphadenopathy:    She has no cervical adenopathy.    BP 120/80 mmHg  Pulse 72  Temp(Src) 98.1 F (36.7 C) (Oral)  Ht _0  (1.727 m)  Wt 197 lb 8 oz (89.585 kg)  BMI 30.04 kg/m2  SpO2 96% Wt Readings from Last 3 Encounters:  04/05/14 197 lb 8 oz (89.585 kg)  10/03/13 196 lb 12 oz (89.245 kg)  04/26/13 198 lb 4 oz (89.926 kg)     Lab Results  Component Value Date   WBC 5.5 12/29/2013   HGB 14.1 12/29/2013   HCT 41 12/29/2013   PLT 334 12/29/2013   CHOL 217* 12/29/2013   TRIG 140 12/29/2013   HDL 57 12/29/2013   LDLCALC 132 12/29/2013   ALT 24 12/29/2013   AST 22 12/29/2013   NA 139 12/29/2013   K 4.6 12/29/2013   CREATININE 0.9 12/29/2013   BUN 22* 12/29/2013   TSH 2.36 12/29/2013       Assessment & Plan:   Problem List Items Addressed This Visit    Anxiety    Increased stress with her  father's medical issues and behavior toward her.  On prozac and doing well.  Takes xanax prn.  Follow.  Does not feel she needs anything more at this point.  Has good support.        Relevant Medications   ALPRAZolam  (XANAX) tablet   FLUoxetine (PROZAC) capsule   Essential hypertension, benign - Primary    Blood pressure doing well.  Continue same medication regimen.  Follow met b.        Relevant Medications   triamterene-hydrochlorothiazide (MAXZIDE-25) 37.5-25 MG per tablet   enalapril (VASOTEC) tablet   Other Relevant Orders   Comprehensive metabolic panel   Family history of colonic polyps    Had a f/u colonoscopy in 12/2013.  She signed form for  [place] to get a copy of results.  Had one polyp.  Recommended f/u in five years.        GERD (gastroesophageal reflux disease)   Health care maintenance    Physical after 10/04/14.  S/p hysterectomy.  Mammogram 09/27/13 - Birads I.  Colonoscopy in 12/2013.  One polyp.  Recommended f/u colonoscopy in five years.        Hypercholesterolemia    Low cholesterol diet and exercise.  Check lipid panel with next fasting labs.        Relevant Medications   triamterene-hydrochlorothiazide (MAXZIDE-25) 37.5-25 MG per tablet   enalapril (VASOTEC) tablet   Other Relevant Orders   Lipid panel   IBS (irritable bowel syndrome)    Bowels stable.        Melanoma    Sees Dr Jordan Benson.        Relevant Medications   ALPRAZolam  Duanne Moron) tablet   Vitamin D deficiency    Vitamin D level 12/28/13 - wnl (39).  Follow.            Einar Pheasant, MD

## 2014-04-05 NOTE — Assessment & Plan Note (Signed)
Had a f/u colonoscopy in 12/2013.  She signed form for [place] to get a copy of results.  Had one polyp.  Recommended f/u in five years.

## 2014-04-05 NOTE — Assessment & Plan Note (Signed)
Sees Dr Evorn Gong.

## 2014-04-05 NOTE — Assessment & Plan Note (Signed)
Physical after 10/04/14.  S/p hysterectomy.  Mammogram 09/27/13 - Birads I.  Colonoscopy in 12/2013.  One polyp.  Recommended f/u colonoscopy in five years.

## 2014-04-05 NOTE — Assessment & Plan Note (Signed)
Bowels stable.  

## 2014-04-10 ENCOUNTER — Encounter: Payer: Self-pay | Admitting: Internal Medicine

## 2014-05-18 ENCOUNTER — Other Ambulatory Visit: Payer: BC Managed Care – PPO

## 2014-06-20 ENCOUNTER — Telehealth: Payer: Self-pay | Admitting: Internal Medicine

## 2014-06-20 MED ORDER — ALPRAZOLAM 0.25 MG PO TABS: 0.2500 mg | ORAL_TABLET | Freq: Every day | ORAL | Status: DC | PRN

## 2014-06-20 NOTE — Telephone Encounter (Signed)
Last OV and refill 2.25.16.  Please advise refill.

## 2014-06-20 NOTE — Telephone Encounter (Signed)
rx faxed

## 2014-06-20 NOTE — Telephone Encounter (Signed)
Refilled xanax #30 with no refills.

## 2014-07-10 ENCOUNTER — Other Ambulatory Visit: Payer: Self-pay | Admitting: Internal Medicine

## 2014-07-18 ENCOUNTER — Telehealth: Payer: Self-pay | Admitting: Internal Medicine

## 2014-07-18 DIAGNOSIS — S82899D Other fracture of unspecified lower leg, subsequent encounter for closed fracture with routine healing: Secondary | ICD-10-CM

## 2014-07-18 NOTE — Telephone Encounter (Signed)
Pt called and stated that she fall and needs a referral to get an xray done. Pt is currently in [place].  Pt is requesting xray at the end of the month (6/29, 6/30, 7/1) when yes comes home. Pt is currently in a boot that she received from the ER. Please advise pt/msn

## 2014-07-18 NOTE — Telephone Encounter (Signed)
Please advise 

## 2014-07-18 NOTE — Telephone Encounter (Signed)
I don't fully understand this message.  It sounds like she fell and was evaluated - if in a boot.  Did she have xray?  Did she see someone there and does she have a fracture.  If so, then will need to f/u with ortho here.

## 2014-07-19 ENCOUNTER — Encounter: Payer: Self-pay | Admitting: *Deleted

## 2014-07-19 ENCOUNTER — Encounter: Payer: Self-pay | Admitting: Internal Medicine

## 2014-07-19 NOTE — Telephone Encounter (Signed)
Pt was seen & dx with a fracture in [place] (pt still in [place])-she is currently in a boot & will need to see Ortho the end of this month. She needs a referral to Ortho on 6/29 or 6/30 if possible before heading to [facility]. Pt was also advised to stop by the office in [facility] and get a copy of her records, reports, & images to bring home.

## 2014-07-19 NOTE — Telephone Encounter (Signed)
LMTCB & sent mychart message 

## 2014-07-19 NOTE — Telephone Encounter (Signed)
Sorry.  Please clarify one more thing with her.  If this is an actual foot fracture and not an ankle or higher fracture, she will probably need to see podiatry.  Ortho does not see if is foot.  Please clarify what exactly she fractured.

## 2014-07-19 NOTE — Telephone Encounter (Signed)
Pt responded via my chart

## 2014-07-20 NOTE — Telephone Encounter (Signed)
Order placed for ortho referral.   

## 2014-08-15 ENCOUNTER — Encounter: Payer: Self-pay | Admitting: *Deleted

## 2014-08-15 LAB — HEMOGLOBIN A1C: HEMOGLOBIN A1C: 5.6 % (ref 4.0–6.0)

## 2014-08-15 LAB — HEPATIC FUNCTION PANEL
ALT: 38 U/L — AB (ref 7–35)
AST: 25 U/L (ref 13–35)
Alkaline Phosphatase: 63 U/L (ref 25–125)
Bilirubin, Total: 1.1 mg/dL

## 2014-08-15 LAB — BASIC METABOLIC PANEL
BUN: 22 mg/dL — AB (ref 4–21)
CREATININE: 0.8 mg/dL (ref 0.5–1.1)
GLUCOSE: 105 mg/dL
Potassium: 4.3 mmol/L (ref 3.4–5.3)
SODIUM: 140 mmol/L (ref 137–147)

## 2014-08-15 LAB — CBC AND DIFFERENTIAL
HEMATOCRIT: 42 % (ref 36–46)
HEMOGLOBIN: 14.2 g/dL (ref 12.0–16.0)
Neutrophils Absolute: 3 /uL
Platelets: 309 10*3/uL (ref 150–399)
WBC: 5.4 10*3/mL

## 2014-08-15 LAB — LIPID PANEL
Cholesterol: 244 mg/dL — AB (ref 0–200)
HDL: 65 mg/dL (ref 35–70)
LDL CALC: 156 mg/dL
TRIGLYCERIDES: 113 mg/dL (ref 40–160)

## 2014-08-15 LAB — TSH: TSH: 2.57 u[IU]/mL (ref 0.41–5.90)

## 2014-08-17 ENCOUNTER — Telehealth: Payer: Self-pay | Admitting: Internal Medicine

## 2014-08-17 NOTE — Telephone Encounter (Signed)
Pt notified of [facility] labs via my chart.  Start cholesterol medication.  Recheck liver panel.

## 2014-09-19 ENCOUNTER — Encounter: Payer: Self-pay | Admitting: Internal Medicine

## 2014-09-19 ENCOUNTER — Other Ambulatory Visit: Payer: Self-pay | Admitting: Internal Medicine

## 2014-09-19 MED ORDER — ALPRAZOLAM 0.25 MG PO TABS
0.2500 mg | ORAL_TABLET | Freq: Every day | ORAL | Status: DC | PRN
Start: 2014-09-19 — End: 2015-01-17

## 2014-09-19 NOTE — Telephone Encounter (Signed)
rx ok'd for xanax #30 with no refills.   

## 2014-09-19 NOTE — Addendum Note (Signed)
Addended by: Alisa Graff on: 09/19/2014 04:19 PM   Modules accepted: Orders

## 2014-10-06 ENCOUNTER — Other Ambulatory Visit: Payer: Self-pay | Admitting: Internal Medicine

## 2014-10-08 ENCOUNTER — Encounter: Payer: BC Managed Care – PPO | Admitting: Internal Medicine

## 2014-10-08 NOTE — Telephone Encounter (Signed)
Last OV 2.25.16.  Please advise refill

## 2014-10-08 NOTE — Telephone Encounter (Signed)
Refilled fluoxetine #90 with no refills.

## 2014-10-17 ENCOUNTER — Encounter: Payer: Self-pay | Admitting: Internal Medicine

## 2014-10-17 ENCOUNTER — Ambulatory Visit (INDEPENDENT_AMBULATORY_CARE_PROVIDER_SITE_OTHER): Payer: BC Managed Care – PPO | Admitting: Internal Medicine

## 2014-10-17 VITALS — BP 120/80 | HR 73 | Temp 98.3°F | Ht 68.0 in | Wt 203.1 lb

## 2014-10-17 DIAGNOSIS — R002 Palpitations: Secondary | ICD-10-CM

## 2014-10-17 DIAGNOSIS — R7989 Other specified abnormal findings of blood chemistry: Secondary | ICD-10-CM | POA: Diagnosis not present

## 2014-10-17 DIAGNOSIS — E7800 Pure hypercholesterolemia, unspecified: Secondary | ICD-10-CM

## 2014-10-17 DIAGNOSIS — Z8371 Family history of colonic polyps: Secondary | ICD-10-CM

## 2014-10-17 DIAGNOSIS — I10 Essential (primary) hypertension: Secondary | ICD-10-CM | POA: Diagnosis not present

## 2014-10-17 DIAGNOSIS — Z23 Encounter for immunization: Secondary | ICD-10-CM | POA: Diagnosis not present

## 2014-10-17 DIAGNOSIS — R945 Abnormal results of liver function studies: Secondary | ICD-10-CM

## 2014-10-17 DIAGNOSIS — C439 Malignant melanoma of skin, unspecified: Secondary | ICD-10-CM

## 2014-10-17 DIAGNOSIS — K589 Irritable bowel syndrome without diarrhea: Secondary | ICD-10-CM

## 2014-10-17 DIAGNOSIS — Z1239 Encounter for other screening for malignant neoplasm of breast: Secondary | ICD-10-CM

## 2014-10-17 DIAGNOSIS — F419 Anxiety disorder, unspecified: Secondary | ICD-10-CM

## 2014-10-17 DIAGNOSIS — Z0000 Encounter for general adult medical examination without abnormal findings: Secondary | ICD-10-CM

## 2014-10-17 DIAGNOSIS — Z83719 Family history of colon polyps, unspecified: Secondary | ICD-10-CM

## 2014-10-17 LAB — HEPATIC FUNCTION PANEL
ALT: 47 U/L — AB (ref 0–35)
AST: 35 U/L (ref 0–37)
Albumin: 4.4 g/dL (ref 3.5–5.2)
Alkaline Phosphatase: 71 U/L (ref 39–117)
Bilirubin, Direct: 0.2 mg/dL (ref 0.0–0.3)
Total Bilirubin: 1.5 mg/dL — ABNORMAL HIGH (ref 0.2–1.2)
Total Protein: 7.1 g/dL (ref 6.0–8.3)

## 2014-10-17 NOTE — Progress Notes (Signed)
Pre-visit discussion using our clinic review tool. No additional management support is needed unless otherwise documented below in the visit note.  

## 2014-10-17 NOTE — Progress Notes (Signed)
Patient ID: Jordan Benson, female   DOB: 1955/03/12, 59 y.o.   MRN: [id number]   Subjective:    Patient ID: Jordan Benson, female    DOB: 01-29-1956, 59 y.o.   MRN: [id number]  HPI  Patient here to follow up on her current medical issues as well as for a complete physical exam.   She reports that she has had a good summer.  Her father is in assisted living.  Fixed up his house this summer.  Some traveling.  Has increased her alcohol intake.  We discussed the need to decrease/stop her increased alcohol intake.  One liver test slightly increased.  Cholesterol increased.  Discussed treatment.  Discussed diet and exercise.  Tries to stay active.  No cardiac symptoms with increased activity or exertion.  No sob.  Bowels stable.  Does report some palpitations.  She has been drinking an increased amount of caffeine and alcohol.  Discussed cutting back on both.     Past Medical History  Diagnosis Date  . Melanoma     followed by Dr Evorn Gong  . Hypertension   . Sleep apnea   . GERD (gastroesophageal reflux disease)   . IBS (irritable bowel syndrome)   . Anxiety   . Vitamin D deficiency    Past Surgical History  Procedure Laterality Date  . Abdominal hysterectomy  1992  . Tubal ligation  1985  . Tummy tuck  1995  . Ovarian cyst removal  1991  . Septoplasty  2009  . Mohs surgery  2012  . Wisdom tooth extraction     Family History  Problem Relation Age of Onset  . Cancer Mother 29    breast cancer  . Heart disease Mother   . Hypertension Mother   . Diabetes Mother   . Heart disease Father    Social History   Social History  . Marital Status: Divorced    Spouse Name: N/A  . Number of Children: 2  . Years of Education: N/A   Occupational History  .     Social History Main Topics  . Smoking status: Former Research scientist (life sciences)  . Smokeless tobacco: Never Used  . Alcohol Use: 0.0 oz/week    0 Standard drinks or equivalent per week  . Drug Use: No  . Sexual Activity: Not Asked    Other Topics Concern  . None   Social History Narrative    Outpatient Encounter Prescriptions as of 10/17/2014  Medication Sig  . ALPRAZolam (XANAX) 0.25 MG tablet Take 1 tablet (0.25 mg total) by mouth daily as needed for anxiety.  . beta carotene w/minerals (OCUVITE) tablet Take 1 tablet by mouth daily.  . Calcium Carb-Cholecalciferol (CALCIUM + D3 PO) Take by mouth daily.  . enalapril (VASOTEC) 5 MG tablet Take 1 tablet (5 mg total) by mouth daily.  Marland Kitchen FLUoxetine (PROZAC) 20 MG capsule TAKE 1 CAPSULE (20 MG TOTAL) BY MOUTH DAILY.  Marland Kitchen triamterene-hydrochlorothiazide (MAXZIDE-25) 37.5-25 MG per tablet Take 1 tablet by mouth daily.  . valACYclovir (VALTREX) 1000 MG tablet Take 1 tablet (1,000 mg total) by mouth as needed.   No facility-administered encounter medications on file as of 10/17/2014.    Review of Systems  Constitutional: Negative for appetite change and unexpected weight change.  HENT: Negative for congestion and sinus pressure.   Eyes: Negative for pain and visual disturbance.  Respiratory: Negative for cough, chest tightness and shortness of breath.   Cardiovascular: Positive for palpitations. Negative for chest pain and leg swelling.  Gastrointestinal: Negative for nausea, vomiting, abdominal pain and diarrhea.  Genitourinary: Negative for dysuria and difficulty urinating.  Musculoskeletal: Negative for back pain and joint swelling.  Skin: Negative for color change and rash.  Neurological: Negative for dizziness, light-headedness and headaches.  Hematological: Negative for adenopathy. Does not bruise/bleed easily.  Psychiatric/Behavioral: Negative for dysphoric mood and agitation.       Objective:     Blood pressure rechecked by me:  126/82  Physical Exam  Constitutional: She is oriented to person, place, and time. She appears well-developed and well-nourished. No distress.  HENT:  Nose: Nose normal.  Mouth/Throat: Oropharynx is clear and moist.  Eyes: Right  eye exhibits no discharge. Left eye exhibits no discharge. No scleral icterus.  Neck: Neck supple. No thyromegaly present.  Cardiovascular: Normal rate and regular rhythm.   Pulmonary/Chest: Breath sounds normal. No accessory muscle usage. No tachypnea. No respiratory distress. She has no decreased breath sounds. She has no wheezes. She has no rhonchi. Right breast exhibits no inverted nipple, no mass, no nipple discharge and no tenderness (no axillary adenopathy). Left breast exhibits no inverted nipple, no mass, no nipple discharge and no tenderness (no axilarry adenopathy).  Abdominal: Soft. Bowel sounds are normal. There is no tenderness.  Musculoskeletal: She exhibits no edema or tenderness.  Lymphadenopathy:    She has no cervical adenopathy.  Neurological: She is alert and oriented to person, place, and time.  Skin: Skin is warm. No rash noted. No erythema.  Psychiatric: She has a normal mood and affect. Her behavior is normal.    BP 120/80 mmHg  Pulse 73  Temp(Src) 98.3 F (36.8 C) (Oral)  Ht 5\' 8"  (1.727 m)  Wt 203 lb 2 oz (92.137 kg)  BMI 30.89 kg/m2  SpO2 97% Wt Readings from Last 3 Encounters:  10/17/14 203 lb 2 oz (92.137 kg)  04/05/14 197 lb 8 oz (89.585 kg)  10/03/13 196 lb 12 oz (89.245 kg)     Lab Results  Component Value Date   WBC 5.4 08/15/2014   HGB 14.2 08/15/2014   HCT 42 08/15/2014   PLT 309 08/15/2014   CHOL 244* 08/15/2014   TRIG 113 08/15/2014   HDL 65 08/15/2014   LDLCALC 156 08/15/2014   ALT 47* 10/17/2014   AST 35 10/17/2014   NA 140 08/15/2014   K 4.3 08/15/2014   CREATININE 0.8 08/15/2014   BUN 22* 08/15/2014   TSH 2.57 08/15/2014   HGBA1C 5.6 08/15/2014       Assessment & Plan:   Problem List Items Addressed This Visit    Abnormal liver function test - Primary    Found on recent lab tests.  Decrease/stop alcohol.  Recheck liver panel today.  Further w/up pending results.       Relevant Orders   Hepatic function panel  (Completed)   Anxiety    Doing better.  On prozac.  Follow.        Essential hypertension, benign    Blood pressure under good control.  Continue same medication regimen.  Follow pressures.  Follow metabolic panel.        Family history of colonic polyps    Colonoscopy 03/16/14 - one 45mm polyp in the rectum, otherwise normal.  Pathology - hyperplastic polyp.  Recommended f/u colonoscopy in five years.        Health care maintenance    Physical today 10/17/14.  S/p hysterectomy.  Scheduled mammogram.        Hypercholesterolemia  Low cholesterol diet and exercise.  Recent LDL elevated.  Will f/u regarding the abnormal liver function tests before adding statin.  Follow lipid panel.       IBS (irritable bowel syndrome)    Bowels stable.       Melanoma    Followed by Dr Evorn Gong.        Palpitations    Increased caffeine intake and alcohol intake. Discussed need to decrease/stop.  Discussed further w/up.  She declines.  Declines EKG, etc.  See if improves with decreased caffeine and alcohol intake.  Follow.         Other Visit Diagnoses    Screening breast examination        Relevant Orders    MM DIGITAL SCREENING BILATERAL    Encounter for immunization            Einar Pheasant, MD

## 2014-10-18 ENCOUNTER — Encounter: Payer: Self-pay | Admitting: Internal Medicine

## 2014-10-18 DIAGNOSIS — R7989 Other specified abnormal findings of blood chemistry: Secondary | ICD-10-CM

## 2014-10-18 DIAGNOSIS — R945 Abnormal results of liver function studies: Principal | ICD-10-CM

## 2014-10-18 NOTE — Telephone Encounter (Signed)
Order placed for abdominal ultrasound.   Pt notified via my chart.

## 2014-10-22 ENCOUNTER — Ambulatory Visit: Payer: BC Managed Care – PPO

## 2014-10-22 ENCOUNTER — Encounter: Payer: Self-pay | Admitting: Internal Medicine

## 2014-10-22 DIAGNOSIS — R945 Abnormal results of liver function studies: Secondary | ICD-10-CM | POA: Insufficient documentation

## 2014-10-22 DIAGNOSIS — R002 Palpitations: Secondary | ICD-10-CM | POA: Insufficient documentation

## 2014-10-22 DIAGNOSIS — R7989 Other specified abnormal findings of blood chemistry: Secondary | ICD-10-CM | POA: Insufficient documentation

## 2014-10-22 NOTE — Assessment & Plan Note (Signed)
Physical today 10/17/14.  S/p hysterectomy.  Scheduled mammogram.

## 2014-10-22 NOTE — Assessment & Plan Note (Signed)
Colonoscopy 03/16/14 - one 3mm polyp in the rectum, otherwise normal.  Pathology - hyperplastic polyp.  Recommended f/u colonoscopy in five years.

## 2014-10-22 NOTE — Assessment & Plan Note (Signed)
Followed by Dr Dasher.   

## 2014-10-22 NOTE — Assessment & Plan Note (Signed)
Increased caffeine intake and alcohol intake. Discussed need to decrease/stop.  Discussed further w/up.  She declines.  Declines EKG, etc.  See if improves with decreased caffeine and alcohol intake.  Follow.

## 2014-10-22 NOTE — Assessment & Plan Note (Signed)
Found on recent lab tests.  Decrease/stop alcohol.  Recheck liver panel today.  Further w/up pending results.

## 2014-10-22 NOTE — Assessment & Plan Note (Signed)
Blood pressure under good control.  Continue same medication regimen.  Follow pressures.  Follow metabolic panel.   

## 2014-10-22 NOTE — Assessment & Plan Note (Addendum)
Low cholesterol diet and exercise.  Recent LDL elevated.  Will f/u regarding the abnormal liver function tests before adding statin.  Follow lipid panel.

## 2014-10-22 NOTE — Assessment & Plan Note (Signed)
Doing better.  On prozac.  Follow.   

## 2014-10-22 NOTE — Assessment & Plan Note (Signed)
Bowels stable.  

## 2014-10-23 ENCOUNTER — Ambulatory Visit: Payer: BC Managed Care – PPO

## 2014-10-25 ENCOUNTER — Encounter: Payer: Self-pay | Admitting: Internal Medicine

## 2014-10-25 ENCOUNTER — Ambulatory Visit
Admission: RE | Admit: 2014-10-25 | Discharge: 2014-10-25 | Disposition: A | Discharge status: Home / Self Care | Payer: BC Managed Care – PPO | Source: Ambulatory Visit | Attending: Internal Medicine | Admitting: Internal Medicine

## 2014-10-25 DIAGNOSIS — R945 Abnormal results of liver function studies: Principal | ICD-10-CM

## 2014-10-25 DIAGNOSIS — R7989 Other specified abnormal findings of blood chemistry: Secondary | ICD-10-CM

## 2014-10-26 ENCOUNTER — Ambulatory Visit
Admission: RE | Admit: 2014-10-26 | Discharge: 2014-10-26 | Disposition: A | Discharge status: Home / Self Care | Payer: BC Managed Care – PPO | Source: Ambulatory Visit | Attending: Internal Medicine | Admitting: Internal Medicine

## 2014-10-26 ENCOUNTER — Ambulatory Visit: Payer: BC Managed Care – PPO

## 2014-10-26 DIAGNOSIS — Z1231 Encounter for screening mammogram for malignant neoplasm of breast: Secondary | ICD-10-CM | POA: Insufficient documentation

## 2014-10-26 DIAGNOSIS — Z1239 Encounter for other screening for malignant neoplasm of breast: Secondary | ICD-10-CM

## 2014-11-01 ENCOUNTER — Other Ambulatory Visit (INDEPENDENT_AMBULATORY_CARE_PROVIDER_SITE_OTHER): Payer: BC Managed Care – PPO

## 2014-11-01 ENCOUNTER — Telehealth: Payer: Self-pay | Admitting: *Deleted

## 2014-11-01 DIAGNOSIS — R945 Abnormal results of liver function studies: Secondary | ICD-10-CM

## 2014-11-01 DIAGNOSIS — R7989 Other specified abnormal findings of blood chemistry: Secondary | ICD-10-CM | POA: Diagnosis not present

## 2014-11-01 LAB — HEPATIC FUNCTION PANEL
ALT: 41 U/L — AB (ref 0–35)
AST: 31 U/L (ref 0–37)
Albumin: 4.4 g/dL (ref 3.5–5.2)
Alkaline Phosphatase: 63 U/L (ref 39–117)
BILIRUBIN DIRECT: 0.2 mg/dL (ref 0.0–0.3)
BILIRUBIN TOTAL: 1.4 mg/dL — AB (ref 0.2–1.2)
Total Protein: 7.6 g/dL (ref 6.0–8.3)

## 2014-11-01 NOTE — Telephone Encounter (Signed)
Labs and dx?  

## 2014-11-01 NOTE — Telephone Encounter (Signed)
Order placed for liver panel.  

## 2014-11-02 ENCOUNTER — Encounter: Payer: Self-pay | Admitting: Internal Medicine

## 2014-12-25 ENCOUNTER — Encounter: Payer: Self-pay | Admitting: Family Medicine

## 2014-12-25 ENCOUNTER — Ambulatory Visit (INDEPENDENT_AMBULATORY_CARE_PROVIDER_SITE_OTHER): Payer: BC Managed Care – PPO | Admitting: Family Medicine

## 2014-12-25 VITALS — BP 136/74 | HR 70 | Temp 98.0°F | Ht 68.0 in | Wt 202.2 lb

## 2014-12-25 DIAGNOSIS — R002 Palpitations: Secondary | ICD-10-CM

## 2014-12-25 DIAGNOSIS — I10 Essential (primary) hypertension: Secondary | ICD-10-CM

## 2014-12-25 LAB — CBC
HCT: 41.3 % (ref 36.0–46.0)
Hemoglobin: 13.9 g/dL (ref 12.0–15.0)
MCHC: 33.8 g/dL (ref 30.0–36.0)
MCV: 92.1 fl (ref 78.0–100.0)
PLATELETS: 274 10*3/uL (ref 150.0–400.0)
RBC: 4.48 Mil/uL (ref 3.87–5.11)
RDW: 11.9 % (ref 11.5–15.5)
WBC: 6 10*3/uL (ref 4.0–10.5)

## 2014-12-25 LAB — COMPREHENSIVE METABOLIC PANEL
ALBUMIN: 4.2 g/dL (ref 3.5–5.2)
ALT: 37 U/L — AB (ref 0–35)
AST: 25 U/L (ref 0–37)
Alkaline Phosphatase: 61 U/L (ref 39–117)
BILIRUBIN TOTAL: 1.4 mg/dL — AB (ref 0.2–1.2)
BUN: 17 mg/dL (ref 6–23)
CALCIUM: 9.5 mg/dL (ref 8.4–10.5)
CO2: 29 meq/L (ref 19–32)
CREATININE: 0.81 mg/dL (ref 0.40–1.20)
Chloride: 104 mEq/L (ref 96–112)
GFR: 76.41 mL/min (ref >60.00)
Glucose, Bld: 94 mg/dL (ref 70–99)
Potassium: 4.1 mEq/L (ref 3.5–5.1)
Sodium: 140 mEq/L (ref 135–145)
TOTAL PROTEIN: 7 g/dL (ref 6.0–8.3)

## 2014-12-25 LAB — TSH: TSH: 1.8 u[IU]/mL (ref 0.35–4.50)

## 2014-12-25 NOTE — Progress Notes (Signed)
Pre visit review using our clinic review tool, if applicable. No additional management support is needed unless otherwise documented below in the visit note. 

## 2014-12-25 NOTE — Assessment & Plan Note (Signed)
These it persisted despite decreasing caffeine and alcohol intake. EKG today is normal sinus rhythm and reassuring. We will check a CBC, TSH, and CMP to evaluate for further causes. If these do not reveal a cause we will refer her to cardiology for Holter monitor evaluation. She is given return precautions.

## 2014-12-25 NOTE — Patient Instructions (Signed)
Nice to meet you. We will check lab work to look for a cause of your palpitations. If these do not reveal a cause we will refer you to cardiology. Please monitor your blood pressure at home it becomes greater than 140/90 please call the office to let us know. If you develop chest pain, shortness of breath, headache, vision changes, numbness, weakness, palpitations, or any new or changing symptoms please seek medical attention immediately.

## 2014-12-25 NOTE — Assessment & Plan Note (Signed)
Blood pressure is at goal today. She does report having elevated blood pressure yesterday at the dentist on a wrist blood pressure cuff. She has been asymptomatic with this. She is asymptomatic today. EKG is reassuring. We will check kidney function today. She will continue on her current medications. She will check her blood pressure at home and let us know if it is greater than 140/90. Given return precautions

## 2014-12-25 NOTE — Progress Notes (Addendum)
Patient ID: Jordan Benson, female   DOB: Apr 24, 1955, 59 y.o.   MRN: [id number]  Tommi Rumps, MD Phone: [phone]  Jordan Benson is a 59 y.o. female who presents today for same-day visit.  Reports she was at the dentist yesterday and then noted her blood pressure to be 178/110. She was asymptomatic at that time. She's not had any chest pain, shortness of breath, vision changes, headaches, numbness, or weakness. She does note for about the past month she's been having intermittent palpitations. It feels like a shudder sensation with her heartbeat. She notes that they last very briefly and resolves on its own. She does not have any chest pain or shortness of breath or lightheadedness with this. She notes she had been seen previously for this and advised to decrease her caffeine intake and decrease her alcohol intake. She has decreased caffeine to one cup of coffee a day. She now only drinks 1 tablespoon of wine in 7-Up nightly. She's not had any palpitations today. She does not have any chest pain or shortness of breath at this time.  PMH: nonsmoker.   ROS see history of present illness  Objective  Physical Exam Filed Vitals:   12/25/14 0926  BP: 136/74  Pulse: 70  Temp: 98 F (36.7 C)   Physical Exam  Constitutional: She is well-developed, well-nourished, and in no distress.  HENT:  Head: Normocephalic and atraumatic.  Cardiovascular: Normal rate, regular rhythm and normal heart sounds.  Exam reveals no gallop and no friction rub.   No murmur heard. 2+ radial pulses  Pulmonary/Chest: Effort normal and breath sounds normal. No respiratory distress. She has no wheezes. She has no rales.  Neurological: She is alert. Gait normal.  CN 2-12 intact, 5/5 strength in bilateral biceps, triceps, grip, quads, hamstrings, plantar and dorsiflexion, sensation to light touch intact in bilateral UE and LE, normal gait, 2+ patellar reflexes  Skin: Skin is warm and dry. She is not  diaphoretic.   EKG: Normal sinus rhythm, rate 67, no ST or T-wave changes  Assessment/Plan: Please see individual problem list.  Essential hypertension, benign Blood pressure is at goal today. She does report having elevated blood pressure yesterday at the dentist on a wrist blood pressure cuff. She has been asymptomatic with this. She is asymptomatic today. EKG is reassuring. We will check kidney function today. She will continue on her current medications. She will check her blood pressure at home and let us know if it is greater than 140/90. Given return precautions  Palpitations These it persisted despite decreasing caffeine and alcohol intake. EKG today is normal sinus rhythm and reassuring. We will check a CBC, TSH, and CMP to evaluate for further causes. If these do not reveal a cause we will refer her to cardiology for Holter monitor evaluation. She is given return precautions.    Orders Placed This Encounter  Procedures  . CBC  . Comp Met (CMET)  . TSH  . EKG 12-Lead     Tommi Rumps

## 2015-01-13 ENCOUNTER — Other Ambulatory Visit: Payer: Self-pay | Admitting: Internal Medicine

## 2015-01-15 ENCOUNTER — Other Ambulatory Visit: Payer: Self-pay | Admitting: Internal Medicine

## 2015-01-17 ENCOUNTER — Other Ambulatory Visit: Payer: Self-pay

## 2015-01-17 ENCOUNTER — Other Ambulatory Visit: Payer: Self-pay | Admitting: Internal Medicine

## 2015-01-17 NOTE — Telephone Encounter (Signed)
Please advise refill? 

## 2015-01-18 ENCOUNTER — Other Ambulatory Visit: Payer: Self-pay | Admitting: Internal Medicine

## 2015-01-18 ENCOUNTER — Encounter: Payer: Self-pay | Admitting: Internal Medicine

## 2015-01-18 MED ORDER — ALPRAZOLAM 0.25 MG PO TABS: 0.2500 mg | ORAL_TABLET | Freq: Every day | ORAL | Status: DC | PRN

## 2015-01-18 NOTE — Telephone Encounter (Signed)
ok'd refill for xanax #30 with no refills.   

## 2015-01-18 NOTE — Telephone Encounter (Signed)
Please call pt and confirm no other symptoms.  I can see her on 01/28/15 at 12:00 for work in for blood pressure.  If any acute symptoms or problems, let me know.  Thanks

## 2015-01-21 ENCOUNTER — Encounter: Payer: Self-pay | Admitting: Family Medicine

## 2015-01-21 ENCOUNTER — Ambulatory Visit (INDEPENDENT_AMBULATORY_CARE_PROVIDER_SITE_OTHER): Payer: BC Managed Care – PPO | Admitting: Family Medicine

## 2015-01-21 VITALS — BP 126/82 | HR 81 | Temp 98.0°F | Ht 68.0 in | Wt 198.5 lb

## 2015-01-21 DIAGNOSIS — I10 Essential (primary) hypertension: Secondary | ICD-10-CM | POA: Diagnosis not present

## 2015-01-21 NOTE — Progress Notes (Signed)
Pre visit review using our clinic review tool, if applicable. No additional management support is needed unless otherwise documented below in the visit note. 

## 2015-01-21 NOTE — Progress Notes (Signed)
Subjective:  Patient ID: Jordan Benson, female    DOB: 1955-10-08  Age: 59 y.o. MRN: [id number]  CC: Elevated BP  HPI:  59 year old female with a past medical history of hypertension, anxiety, OSA presents with complaints of elevated BP.  HTN  Patient has long-standing history of hypertension.  She states that her blood pressures have been elevated as of recent.  She was recently seen for this on 11/15.  She states that on Friday her blood pressure was elevated after she took it at a local [employer].  She states that it was probably 150/95.  She reports that she's had some associated dizziness and slight headache. She reports that this may be "in my head" given her worry about the elevated blood pressure.  No associated chest pain or shortness of breath.  Patient states that she feels fine at this time.  He states that she is under some stress and thinks that this might be the culprit of her elevated blood pressure.  No known relieving factors (other than her medication).  She endorses compliance with her home blood pressure medications of enalapril and Maxzide.  Social Hx   Social History   Social History  . Marital Status: Divorced    Spouse Name: N/A  . Number of Children: 2  . Years of Education: N/A   Occupational History  .     Social History Main Topics  . Smoking status: Former Research scientist (life sciences)  . Smokeless tobacco: Never Used  . Alcohol Use: 0.0 oz/week    0 Standard drinks or equivalent per week  . Drug Use: No  . Sexual Activity: Not Asked   Other Topics Concern  . None   Social History Narrative   Review of Systems  Constitutional: Negative.   Respiratory: Negative for shortness of breath.   Cardiovascular: Negative for chest pain.  Neurological: Positive for dizziness and headaches.   Objective:  BP 126/82 mmHg  Pulse 81  Temp(Src) 98 F (36.7 C) (Oral)  Ht 5\' 8"  (1.727 m)  Wt 198 lb 8 oz (90.039 kg)  BMI 30.19 kg/m2  SpO2  97%  BP/Weight 01/21/2015 [id number]  Systolic BP [id number]  Diastolic BP 82 74 80  Wt. (Lbs) 198.5 202.2 203.13  BMI 30.19 30.75 30.89   Physical Exam  Constitutional: She is oriented to person, place, and time. She appears well-developed. No distress.  HENT:  Head: Normocephalic and atraumatic.  Eyes: No scleral icterus.  Cardiovascular: Normal rate and regular rhythm.   No murmur heard. Pulmonary/Chest: Effort normal and breath sounds normal. No respiratory distress. She has no wheezes. She has no rales.  Neurological: She is alert and oriented to person, place, and time.  Psychiatric:  Anxious.  Vitals reviewed.  Lab Results  Component Value Date   WBC 6.0 12/25/2014   HGB 13.9 12/25/2014   HCT 41.3 12/25/2014   PLT 274.0 12/25/2014   GLUCOSE 94 12/25/2014   CHOL 244* 08/15/2014   TRIG 113 08/15/2014   HDL 65 08/15/2014   LDLCALC 156 08/15/2014   ALT 37* 12/25/2014   AST 25 12/25/2014   NA 140 12/25/2014   K 4.1 12/25/2014   CL 104 12/25/2014   CREATININE 0.81 12/25/2014   BUN 17 12/25/2014   CO2 29 12/25/2014   TSH 1.80 12/25/2014   HGBA1C 5.6 08/15/2014   Assessment & Plan:   Problem List Items Addressed This Visit    Essential hypertension, benign - Primary  Well controlled at this time.  We discussed her elevated BP readings and treatment/management options: No changes, Change medication, Home BP monitoring, 24 hour ambulatory BP monitoring.  Given the fact that her blood pressure was well controlled today, we elected to not make any medication changes. Patient would like to proceed with regular home blood pressure monitoring (patient to go pick up a home cuff and monitor daily). Patient to continue her current meds of enalapril and Maxzide.        Follow-up: Return if symptoms worsen or fail to improve.  [place]

## 2015-01-21 NOTE — Assessment & Plan Note (Addendum)
Well controlled at this time.  We discussed her elevated BP readings and treatment/management options: No changes, Change medication, Home BP monitoring, 24 hour ambulatory BP monitoring.  Given the fact that her blood pressure was well controlled today, we elected to not make any medication changes. Patient would like to proceed with regular home blood pressure monitoring (patient to go pick up a home cuff and monitor daily). Patient to continue her current meds of enalapril and Maxzide.

## 2015-01-28 ENCOUNTER — Ambulatory Visit: Payer: BC Managed Care – PPO | Admitting: Internal Medicine

## 2015-02-19 ENCOUNTER — Encounter: Payer: Self-pay | Admitting: Internal Medicine

## 2015-02-19 DIAGNOSIS — R002 Palpitations: Secondary | ICD-10-CM

## 2015-02-19 DIAGNOSIS — E7800 Pure hypercholesterolemia, unspecified: Secondary | ICD-10-CM

## 2015-02-19 DIAGNOSIS — R739 Hyperglycemia, unspecified: Secondary | ICD-10-CM

## 2015-02-19 DIAGNOSIS — I499 Cardiac arrhythmia, unspecified: Secondary | ICD-10-CM

## 2015-02-19 NOTE — Telephone Encounter (Signed)
Please advise 

## 2015-02-20 NOTE — Telephone Encounter (Signed)
Order placed for cardiology referral.   

## 2015-02-26 NOTE — Addendum Note (Signed)
Addended by: Alisa Graff on: 02/26/2015 01:25 PM   Modules accepted: Orders

## 2015-02-26 NOTE — Telephone Encounter (Signed)
Orders placed for labs.  Please schedule a lab appt - per pts my chart message (prior to her appt)  - thanks

## 2015-03-01 ENCOUNTER — Other Ambulatory Visit (INDEPENDENT_AMBULATORY_CARE_PROVIDER_SITE_OTHER): Payer: BC Managed Care – PPO

## 2015-03-01 DIAGNOSIS — E7800 Pure hypercholesterolemia, unspecified: Secondary | ICD-10-CM

## 2015-03-01 DIAGNOSIS — R739 Hyperglycemia, unspecified: Secondary | ICD-10-CM | POA: Diagnosis not present

## 2015-03-01 LAB — BASIC METABOLIC PANEL
BUN: 20 mg/dL (ref 6–23)
CHLORIDE: 103 meq/L (ref 96–112)
CO2: 29 meq/L (ref 19–32)
CREATININE: 0.82 mg/dL (ref 0.40–1.20)
Calcium: 9.2 mg/dL (ref 8.4–10.5)
GFR: 75.29 mL/min (ref >60.00)
GLUCOSE: 101 mg/dL — AB (ref 70–99)
Potassium: 4.4 mEq/L (ref 3.5–5.1)
Sodium: 140 mEq/L (ref 135–145)

## 2015-03-01 LAB — HEPATIC FUNCTION PANEL
ALK PHOS: 61 U/L (ref 39–117)
ALT: 25 U/L (ref 0–35)
AST: 19 U/L (ref 0–37)
Albumin: 4 g/dL (ref 3.5–5.2)
BILIRUBIN DIRECT: 0.2 mg/dL (ref 0.0–0.3)
Total Bilirubin: 1.4 mg/dL — ABNORMAL HIGH (ref 0.2–1.2)
Total Protein: 6.9 g/dL (ref 6.0–8.3)

## 2015-03-01 LAB — LIPID PANEL
CHOLESTEROL: 215 mg/dL — AB (ref 0–200)
HDL: 52.6 mg/dL (ref >39.00)
LDL CALC: 133 mg/dL — AB (ref 0–99)
NonHDL: 162.72
TRIGLYCERIDES: 150 mg/dL — AB (ref 0.0–149.0)
Total CHOL/HDL Ratio: 4
VLDL: 30 mg/dL (ref 0.0–40.0)

## 2015-03-01 LAB — HEMOGLOBIN A1C: HEMOGLOBIN A1C: 5.3 % (ref 4.6–6.5)

## 2015-03-02 ENCOUNTER — Encounter: Payer: Self-pay | Admitting: Internal Medicine

## 2015-03-05 ENCOUNTER — Encounter: Payer: Self-pay | Admitting: Internal Medicine

## 2015-03-05 ENCOUNTER — Ambulatory Visit (INDEPENDENT_AMBULATORY_CARE_PROVIDER_SITE_OTHER): Payer: BC Managed Care – PPO | Admitting: Internal Medicine

## 2015-03-05 VITALS — BP 120/70 | HR 84 | Temp 98.1°F | Resp 18 | Ht 68.0 in | Wt 200.5 lb

## 2015-03-05 DIAGNOSIS — C439 Malignant melanoma of skin, unspecified: Secondary | ICD-10-CM

## 2015-03-05 DIAGNOSIS — E7800 Pure hypercholesterolemia, unspecified: Secondary | ICD-10-CM | POA: Diagnosis not present

## 2015-03-05 DIAGNOSIS — R002 Palpitations: Secondary | ICD-10-CM | POA: Diagnosis not present

## 2015-03-05 DIAGNOSIS — F419 Anxiety disorder, unspecified: Secondary | ICD-10-CM

## 2015-03-05 DIAGNOSIS — R7989 Other specified abnormal findings of blood chemistry: Secondary | ICD-10-CM

## 2015-03-05 DIAGNOSIS — R945 Abnormal results of liver function studies: Secondary | ICD-10-CM

## 2015-03-05 DIAGNOSIS — Z8371 Family history of colonic polyps: Secondary | ICD-10-CM

## 2015-03-05 DIAGNOSIS — I10 Essential (primary) hypertension: Secondary | ICD-10-CM | POA: Diagnosis not present

## 2015-03-05 NOTE — Progress Notes (Signed)
Patient ID: Jordan Benson, female   DOB: [birth year], 60 y.o.   MRN: [id number]   Subjective:    Patient ID: Jordan Benson, female    DOB: 09/06/1955, 60 y.o.   MRN: [id number]  HPI  Patient with past history of hypertension, GERD, sleep apnea and hypercholesterolemia.  She comes in today to follow up on these issues.  She has had problems recently with increased heart rate and palpitations.  Saw cardiology.  Had holter placed.  Had stress testing.  Has decreased her coffee and decreased stimulants.  Increased stress with her husband's health issues and her father's issues.  Overall she feels she is handling things relatively well.  Uses xanax prn.     Past Medical History  Diagnosis Date  . Hypertension   . Sleep apnea   . GERD (gastroesophageal reflux disease)   . IBS (irritable bowel syndrome)   . Anxiety   . Vitamin D deficiency   . Melanoma ([place])     followed by Dr Evorn Gong   Past Surgical History  Procedure Laterality Date  . Abdominal hysterectomy  1992  . Tubal ligation  1985  . Tummy tuck  1995  . Ovarian cyst removal  1991  . Septoplasty  2009  . Mohs surgery  2012  . Wisdom tooth extraction     Family History  Problem Relation Age of Onset  . Cancer Mother 92    breast cancer  . Heart disease Mother   . Hypertension Mother   . Diabetes Mother   . Breast cancer Mother 62  . Heart disease Father    Social History   Social History  . Marital Status: Divorced    Spouse Name: N/A  . Number of Children: 2  . Years of Education: N/A   Occupational History  .     Social History Main Topics  . Smoking status: Former Research scientist (life sciences)  . Smokeless tobacco: Never Used  . Alcohol Use: 0.0 oz/week    0 Standard drinks or equivalent per week  . Drug Use: No  . Sexual Activity: Not Asked   Other Topics Concern  . None   Social History Narrative    Outpatient Encounter Prescriptions as of 03/05/2015  Medication Sig  . ALPRAZolam (XANAX) 0.25 MG tablet Take 1  tablet (0.25 mg total) by mouth daily as needed for anxiety.  . beta carotene w/minerals (OCUVITE) tablet Take 1 tablet by mouth daily.  . Calcium Carb-Cholecalciferol (CALCIUM + D3 PO) Take by mouth daily.  . enalapril (VASOTEC) 5 MG tablet Take 1 tablet (5 mg total) by mouth daily.  Marland Kitchen FLUoxetine (PROZAC) 20 MG capsule TAKE 1 CAPSULE (20 MG TOTAL) BY MOUTH DAILY.  Marland Kitchen triamterene-hydrochlorothiazide (MAXZIDE-25) 37.5-25 MG per tablet Take 1 tablet by mouth daily.  . valACYclovir (VALTREX) 1000 MG tablet Take 1 tablet (1,000 mg total) by mouth as needed.  . [DISCONTINUED] FLUoxetine (PROZAC) 20 MG capsule TAKE 1 CAPSULE (20 MG TOTAL) BY MOUTH DAILY.   No facility-administered encounter medications on file as of 03/05/2015.    Review of Systems  Constitutional: Negative for appetite change and unexpected weight change.  HENT: Negative for congestion and sinus pressure.   Respiratory: Negative for cough, chest tightness and shortness of breath.   Cardiovascular: Positive for palpitations. Negative for chest pain and leg swelling.  Gastrointestinal: Negative for nausea, vomiting, abdominal pain and diarrhea.  Genitourinary: Negative for dysuria and difficulty urinating.  Musculoskeletal: Negative for back pain and joint swelling.  Skin:  Negative for color change and rash.  Neurological: Negative for dizziness, light-headedness and headaches.  Psychiatric/Behavioral: Negative for dysphoric mood and agitation.       Increased stress as outlined.         Objective:    Physical Exam  Constitutional: She appears well-developed and well-nourished. No distress.  HENT:  Nose: Nose normal.  Mouth/Throat: Oropharynx is clear and moist.  Eyes: Conjunctivae are normal. Right eye exhibits no discharge. Left eye exhibits no discharge.  Neck: Neck supple. No thyromegaly present.  Cardiovascular: Normal rate and regular rhythm.   Pulmonary/Chest: Breath sounds normal. No respiratory distress. She has  no wheezes.  Abdominal: Soft. Bowel sounds are normal. There is no tenderness.  Musculoskeletal: She exhibits no edema or tenderness.  Lymphadenopathy:    She has no cervical adenopathy.  Skin: No rash noted. No erythema.  Psychiatric: She has a normal mood and affect. Her behavior is normal.    BP 120/70 mmHg  Pulse 84  Temp(Src) 98.1 F (36.7 C) (Oral)  Resp 18  Ht 5\' 8"  (1.727 m)  Wt 200 lb 8 oz (90.946 kg)  BMI 30.49 kg/m2  SpO2 97% Wt Readings from Last 3 Encounters:  03/05/15 200 lb 8 oz (90.946 kg)  01/21/15 198 lb 8 oz (90.039 kg)  12/25/14 202 lb 3.2 oz (91.717 kg)     Lab Results  Component Value Date   WBC 6.0 12/25/2014   HGB 13.9 12/25/2014   HCT 41.3 12/25/2014   PLT 274.0 12/25/2014   GLUCOSE 101* 03/01/2015   CHOL 215* 03/01/2015   TRIG 150.0* 03/01/2015   HDL 52.60 03/01/2015   LDLCALC 133* 03/01/2015   ALT 25 03/01/2015   AST 19 03/01/2015   NA 140 03/01/2015   K 4.4 03/01/2015   CL 103 03/01/2015   CREATININE 0.82 03/01/2015   BUN 20 03/01/2015   CO2 29 03/01/2015   TSH 1.80 12/25/2014   HGBA1C 5.3 03/01/2015    Mm Digital Screening Bilateral  10/26/2014  CLINICAL DATA:  Screening. EXAM: DIGITAL SCREENING BILATERAL MAMMOGRAM WITH CAD COMPARISON:  Previous exam(s). ACR Breast Density Category b: There are scattered areas of fibroglandular density. FINDINGS: There are no findings suspicious for malignancy. Images were processed with CAD. IMPRESSION: No mammographic evidence of malignancy. A result letter of this screening mammogram will be mailed directly to the patient. RECOMMENDATION: Screening mammogram in one year. (Code:SM-B-01Y) BI-RADS CATEGORY  1: Negative. Electronically Signed   By: Conchita Paris M.D.   On: 10/26/2014 12:55       Assessment & Plan:   Problem List Items Addressed This Visit    Abnormal liver function test    Has significantly decreased her alcohol intake.  Follow liver panel.        Relevant Orders   Hepatic  function panel   Anxiety    Increased stress.  On prozac.  Takes xanax prn.  Does not feel needs any further intervention.  Follow.        Essential hypertension, benign - Primary    Blood pressure under good control.  Continue same medication regimen.  Follow pressures.  Follow metabolic panel.        Relevant Orders   Hemoglobin [id number]   Basic metabolic panel   Family history of colonic polyps    Colonoscopy 03/16/14 - one 36mm polyp in the rectum, otherwise normal.  Pathology - hyperplastic polyp.  Recommended f/u colonoscopy in five years.       Hyperbilirubinemia  Stable.  Follow.        Hypercholesterolemia    Low cholesterol diet and exercise.  Follow lipid panel.        Relevant Orders   Lipid panel   Melanoma ([place])    Followed by Dr Evorn Gong.        Palpitations    Has decreased alcohol intake, caffeine intake and stimulants.  Saw cardiology.   W/up in progress.  Awaiting results.  Stable.  Follow.            Einar Pheasant, MD

## 2015-03-05 NOTE — Progress Notes (Signed)
Pre-visit discussion using our clinic review tool. No additional management support is needed unless otherwise documented below in the visit note.  

## 2015-03-06 ENCOUNTER — Ambulatory Visit: Payer: BC Managed Care – PPO | Admitting: Internal Medicine

## 2015-03-10 ENCOUNTER — Encounter: Payer: Self-pay | Admitting: Internal Medicine

## 2015-03-10 NOTE — Assessment & Plan Note (Signed)
Followed by Dr Dasher.   

## 2015-03-10 NOTE — Assessment & Plan Note (Signed)
Has significantly decreased her alcohol intake.  Follow liver panel.

## 2015-03-10 NOTE — Assessment & Plan Note (Signed)
Low cholesterol diet and exercise.  Follow lipid panel.   

## 2015-03-10 NOTE — Assessment & Plan Note (Signed)
Stable.  Follow.   

## 2015-03-10 NOTE — Assessment & Plan Note (Signed)
Colonoscopy 03/16/14 - one 3mm polyp in the rectum, otherwise normal.  Pathology - hyperplastic polyp.  Recommended f/u colonoscopy in five years.   

## 2015-03-10 NOTE — Assessment & Plan Note (Signed)
Increased stress.  On prozac.  Takes xanax prn.  Does not feel needs any further intervention.  Follow.

## 2015-03-10 NOTE — Assessment & Plan Note (Signed)
Has decreased alcohol intake, caffeine intake and stimulants.  Saw cardiology.   W/up in progress.  Awaiting results.  Stable.  Follow.

## 2015-03-10 NOTE — Assessment & Plan Note (Signed)
Blood pressure under good control.  Continue same medication regimen.  Follow pressures.  Follow metabolic panel.   

## 2015-04-22 ENCOUNTER — Other Ambulatory Visit: Payer: Self-pay | Admitting: Internal Medicine

## 2015-05-31 ENCOUNTER — Other Ambulatory Visit: Payer: BC Managed Care – PPO

## 2015-06-06 ENCOUNTER — Ambulatory Visit: Payer: BC Managed Care – PPO | Admitting: Internal Medicine

## 2015-06-12 ENCOUNTER — Other Ambulatory Visit: Payer: Self-pay | Admitting: Internal Medicine

## 2015-06-14 MED ORDER — ALPRAZOLAM 0.25 MG PO TABS: 0.2500 mg | ORAL_TABLET | Freq: Every day | ORAL | Status: DC | PRN

## 2015-06-14 NOTE — Telephone Encounter (Signed)
ok'd refill for xanax #30 with no refills.   

## 2015-06-14 NOTE — Addendum Note (Signed)
Addended by: Alisa Graff on: 06/14/2015 01:59 PM   Modules accepted: Orders

## 2015-07-16 ENCOUNTER — Telehealth: Payer: Self-pay | Admitting: Internal Medicine

## 2015-07-17 ENCOUNTER — Other Ambulatory Visit: Payer: Self-pay | Admitting: Internal Medicine

## 2015-07-17 MED ORDER — TRIAMTERENE-HCTZ 37.5-25 MG PO TABS: 1.0000 | ORAL_TABLET | Freq: Every day | ORAL | Status: DC

## 2015-07-17 MED ORDER — FLUOXETINE HCL 20 MG PO CAPS: ORAL_CAPSULE | ORAL | Status: DC

## 2015-07-17 MED ORDER — ENALAPRIL MALEATE 5 MG PO TABS: ORAL_TABLET | ORAL | Status: DC

## 2015-07-17 NOTE — Addendum Note (Signed)
Addended by: Alisa Graff on: 07/17/2015 05:05 AM   Modules accepted: Orders

## 2015-07-17 NOTE — Telephone Encounter (Signed)
I have refilled the "usual three rx" she requested.  Xanax just refilled 06/14/15.  Does she need this one refilled now?  Is she doing ok?  I am ok to refill x 1, just need a little more information.  Thanks

## 2015-07-17 NOTE — Telephone Encounter (Signed)
Left a VM to return my call. 

## 2015-09-09 ENCOUNTER — Encounter: Payer: Self-pay | Admitting: Internal Medicine

## 2015-09-09 ENCOUNTER — Ambulatory Visit (INDEPENDENT_AMBULATORY_CARE_PROVIDER_SITE_OTHER): Payer: BC Managed Care – PPO | Admitting: Internal Medicine

## 2015-09-09 DIAGNOSIS — E7800 Pure hypercholesterolemia, unspecified: Secondary | ICD-10-CM

## 2015-09-09 DIAGNOSIS — E559 Vitamin D deficiency, unspecified: Secondary | ICD-10-CM

## 2015-09-09 DIAGNOSIS — R7989 Other specified abnormal findings of blood chemistry: Secondary | ICD-10-CM

## 2015-09-09 DIAGNOSIS — C439 Malignant melanoma of skin, unspecified: Secondary | ICD-10-CM

## 2015-09-09 DIAGNOSIS — R002 Palpitations: Secondary | ICD-10-CM

## 2015-09-09 DIAGNOSIS — R945 Abnormal results of liver function studies: Secondary | ICD-10-CM

## 2015-09-09 DIAGNOSIS — I10 Essential (primary) hypertension: Secondary | ICD-10-CM

## 2015-09-09 DIAGNOSIS — F419 Anxiety disorder, unspecified: Secondary | ICD-10-CM

## 2015-09-09 NOTE — Progress Notes (Signed)
Pre-visit discussion using our clinic review tool. No additional management support is needed unless otherwise documented below in the visit note.  

## 2015-09-09 NOTE — Progress Notes (Signed)
Patient ID: Jordan Benson, female   DOB: 07/14/1955, 60 y.o.   MRN: [id number]   Subjective:    Patient ID: Jordan Benson, female    DOB: [birth year], 60 y.o.   MRN: [id number]  HPI  Patient here for a scheduled follow up.  She has been under increased stress with family medical issues.  Her husband just had bone marrow biopsy.  Waiting for results.  Her sister is having issues as well.  Discussed with her today.  She feels she is handling things relatively well.  Does not feel she needs anything more.  Has been out of work for the last two months.  Summer off.  She spent the summer at the beach.  Visited with friends.  Not watching her diet.  Is exercising.  Drank increased amount of alcohol.  Has cut back now.  Only drinks now when goes out to dinner - 1-2 times per week.  Has martini with dinner.  Still some palpitations.  No worsening.  No chest pain.  No sob.  No abdominal pain or cramping.  Bowels stable.  No urine change.     Past Medical History:  Diagnosis Date  . Anxiety   . GERD (gastroesophageal reflux disease)   . Hypertension   . IBS (irritable bowel syndrome)   . Melanoma ([place])    followed by Dr Evorn Gong  . Sleep apnea   . Vitamin D deficiency    Past Surgical History:  Procedure Laterality Date  . ABDOMINAL HYSTERECTOMY  1992  . MOHS SURGERY  2012  . OVARIAN CYST REMOVAL  1991  . SEPTOPLASTY  2009  . TUBAL LIGATION  1985  . tummy tuck  1995  . WISDOM TOOTH EXTRACTION     Family History  Problem Relation Age of Onset  . Cancer Mother 36    breast cancer  . Heart disease Mother   . Hypertension Mother   . Diabetes Mother   . Breast cancer Mother 34  . Heart disease Father    Social History   Social History  . Marital status: Divorced    Spouse name: N/A  . Number of children: 2  . Years of education: N/A   Occupational History  .  [employer]   Social History Main Topics  . Smoking status: Former Research scientist (life sciences)  . Smokeless tobacco: Never Used  . Alcohol use  0.0 oz/week  . Drug use: No  . Sexual activity: Not Asked   Other Topics Concern  . None   Social History Narrative  . None    Outpatient Encounter Prescriptions as of 09/09/2015  Medication Sig  . ALPRAZolam (XANAX) 0.25 MG tablet Take 1 tablet (0.25 mg total) by mouth daily as needed for anxiety.  . beta carotene w/minerals (OCUVITE) tablet Take 1 tablet by mouth daily.  . Calcium Carb-Cholecalciferol (CALCIUM + D3 PO) Take by mouth daily.  . enalapril (VASOTEC) 5 MG tablet TAKE 1 TABLET (5 MG TOTAL) BY MOUTH DAILY.  Marland Kitchen FLUoxetine (PROZAC) 20 MG capsule TAKE 1 CAPSULE (20 MG TOTAL) BY MOUTH DAILY.  Marland Kitchen triamterene-hydrochlorothiazide (MAXZIDE-25) 37.5-25 MG tablet Take 1 tablet by mouth daily.  . valACYclovir (VALTREX) 1000 MG tablet Take 1 tablet (1,000 mg total) by mouth as needed.   No facility-administered encounter medications on file as of 09/09/2015.     Review of Systems  Constitutional: Negative for appetite change and unexpected weight change.       Not watching her diet.   HENT: Negative for  congestion and sinus pressure.   Respiratory: Negative for cough, chest tightness and shortness of breath.   Cardiovascular: Positive for palpitations. Negative for chest pain and leg swelling.  Gastrointestinal: Negative for abdominal pain, diarrhea, nausea and vomiting.  Genitourinary: Negative for difficulty urinating and dysuria.  Musculoskeletal: Negative for back pain and joint swelling.  Skin: Negative for color change and rash.  Neurological: Negative for dizziness, light-headedness and headaches.  Psychiatric/Behavioral: Negative for agitation and dysphoric mood.       Increased stress as outlined.         Objective:    Physical Exam  Constitutional: She appears well-developed and well-nourished. No distress.  HENT:  Nose: Nose normal.  Mouth/Throat: Oropharynx is clear and moist.  Neck: Neck supple. No thyromegaly present.  Cardiovascular: Normal rate and regular  rhythm.   Pulmonary/Chest: Breath sounds normal. No respiratory distress. She has no wheezes.  Abdominal: Soft. Bowel sounds are normal. There is no tenderness.  Musculoskeletal: She exhibits no edema or tenderness.  Lymphadenopathy:    She has no cervical adenopathy.  Skin: No rash noted. No erythema.  Psychiatric: She has a normal mood and affect. Her behavior is normal.    BP 120/80 (BP Location: Right Arm, Patient Position: Sitting, Cuff Size: Large)   Pulse 76   Temp 98 F (36.7 C) (Oral)   Resp 18   Ht '5\' 8"'$  (1.727 m)   Wt 202 lb 12 oz (92 kg)   SpO2 96%   BMI 30.83 kg/m  Wt Readings from Last 3 Encounters:  09/09/15 202 lb 12 oz (92 kg)  03/05/15 200 lb 8 oz (90.9 kg)  01/21/15 198 lb 8 oz (90 kg)     Lab Results  Component Value Date   WBC 6.0 12/25/2014   HGB 13.9 12/25/2014   HCT 41.3 12/25/2014   PLT 274.0 12/25/2014   GLUCOSE 101 (H) 03/01/2015   CHOL 215 (H) 03/01/2015   TRIG 150.0 (H) 03/01/2015   HDL 52.60 03/01/2015   LDLCALC 133 (H) 03/01/2015   ALT 25 03/01/2015   AST 19 03/01/2015   NA 140 03/01/2015   K 4.4 03/01/2015   CL 103 03/01/2015   CREATININE 0.82 03/01/2015   BUN 20 03/01/2015   CO2 29 03/01/2015   TSH 1.80 12/25/2014   HGBA1C 5.3 03/01/2015    Mm Digital Screening Bilateral  Result Date: 10/26/2014 CLINICAL DATA:  Screening. EXAM: DIGITAL SCREENING BILATERAL MAMMOGRAM WITH CAD COMPARISON:  Previous exam(s). ACR Breast Density Category b: There are scattered areas of fibroglandular density. FINDINGS: There are no findings suspicious for malignancy. Images were processed with CAD. IMPRESSION: No mammographic evidence of malignancy. A result letter of this screening mammogram will be mailed directly to the patient. RECOMMENDATION: Screening mammogram in one year. (Code:SM-B-01Y) BI-RADS CATEGORY  1: Negative. Electronically Signed   By: Conchita Paris M.D.   On: 10/26/2014 12:55       Assessment & Plan:   Problem List Items  Addressed This Visit    Abnormal liver function test    Has decreased alcohol intake.  Recheck liver panel.  Diet and exercise.        Anxiety    Increased stress.  On prozac.  Has xanax if needed.  Rarely takes.  Discussed adjusting the dose of prozac.  She wants to hold on changes at this time.  Follow closely.  Notify me if any change or problems.  Does not feel needs any further intervention at this time.  Seeing a Social worker.        Essential hypertension, benign    Blood pressure under good control.  Continue same medication regimen.  Follow pressures.  Follow metabolic panel.        Hypercholesterolemia    Low cholesterol diet and exercise.  She has decreased her alcohol intake now.  Follow lipid panel.        Melanoma ([place])    Followed by Dr Evorn Gong.       Palpitations    Persistent.  Saw cardiology.  Had holter.  Does not feel has changed.  Desires no further w/up or evaluation.  Follow.        Vitamin D deficiency    Continue vitamin D supplements.         Other Visit Diagnoses   None.      Einar Pheasant, MD

## 2015-09-10 ENCOUNTER — Encounter: Payer: Self-pay | Admitting: Internal Medicine

## 2015-09-10 NOTE — Assessment & Plan Note (Addendum)
Blood pressure under good control.  Continue same medication regimen.  Follow pressures.  Follow metabolic panel.   

## 2015-09-10 NOTE — Assessment & Plan Note (Signed)
Followed by Dr Dasher.   

## 2015-09-10 NOTE — Assessment & Plan Note (Signed)
Increased stress.  On prozac.  Has xanax if needed.  Rarely takes.  Discussed adjusting the dose of prozac.  She wants to hold on changes at this time.  Follow closely.  Notify me if any change or problems.  Does not feel needs any further intervention at this time.  Seeing a Social worker.

## 2015-09-10 NOTE — Assessment & Plan Note (Signed)
Has decreased alcohol intake.  Recheck liver panel.  Diet and exercise.

## 2015-09-10 NOTE — Assessment & Plan Note (Signed)
Continue vitamin D supplements.  

## 2015-09-10 NOTE — Assessment & Plan Note (Signed)
Low cholesterol diet and exercise.  She has decreased her alcohol intake now.  Follow lipid panel.

## 2015-09-10 NOTE — Assessment & Plan Note (Signed)
Persistent.  Saw cardiology.  Had holter.  Does not feel has changed.  Desires no further w/up or evaluation.  Follow.

## 2015-10-12 ENCOUNTER — Other Ambulatory Visit: Payer: Self-pay | Admitting: Internal Medicine

## 2015-10-17 ENCOUNTER — Other Ambulatory Visit: Payer: Self-pay | Admitting: Internal Medicine

## 2015-10-18 ENCOUNTER — Other Ambulatory Visit: Payer: Self-pay | Admitting: Internal Medicine

## 2015-10-18 MED ORDER — ENALAPRIL MALEATE 5 MG PO TABS: 5.0000 mg | ORAL_TABLET | Freq: Every day | ORAL | 0 refills | Status: DC

## 2015-10-18 MED ORDER — ALPRAZOLAM 0.25 MG PO TABS: 0.2500 mg | ORAL_TABLET | Freq: Every day | ORAL | 0 refills | Status: DC | PRN

## 2015-10-18 MED ORDER — TRIAMTERENE-HCTZ 37.5-25 MG PO TABS: 1.0000 | ORAL_TABLET | Freq: Every day | ORAL | 0 refills | Status: DC

## 2015-10-18 NOTE — Telephone Encounter (Signed)
I am ok to refill her alprazolam x 1 and her enalapril and triam/hctz x 1 - but she needs labs. Overdue.  Labs are ordered.  Please schedule and then can refill above medication.  Also, she is questioning increasing the prozac dose.  I can increased this to 40mg  q day, if she is agreeable.  Just let me know.

## 2015-10-18 NOTE — Telephone Encounter (Signed)
Patient scheduled labs for 11/01/15, she would like to increase Prozac to 40mg 

## 2015-10-20 MED ORDER — FLUOXETINE HCL 40 MG PO CAPS: 40.0000 mg | ORAL_CAPSULE | Freq: Every day | ORAL | 1 refills | Status: DC

## 2015-10-20 NOTE — Addendum Note (Signed)
Addended by: Alisa Graff on: 10/20/2015 11:27 PM   Modules accepted: Orders

## 2015-10-20 NOTE — Telephone Encounter (Signed)
I have increased the prozac to 40mg  q day and sent in rx for prozac #30 with one refill.  Please notify pt.

## 2015-11-01 ENCOUNTER — Other Ambulatory Visit: Payer: BC Managed Care – PPO

## 2015-11-11 ENCOUNTER — Other Ambulatory Visit: Payer: Self-pay | Admitting: Internal Medicine

## 2015-11-11 DIAGNOSIS — Z1231 Encounter for screening mammogram for malignant neoplasm of breast: Secondary | ICD-10-CM

## 2015-11-13 ENCOUNTER — Other Ambulatory Visit (INDEPENDENT_AMBULATORY_CARE_PROVIDER_SITE_OTHER): Payer: BC Managed Care – PPO

## 2015-11-13 DIAGNOSIS — I10 Essential (primary) hypertension: Secondary | ICD-10-CM | POA: Diagnosis not present

## 2015-11-13 DIAGNOSIS — R7989 Other specified abnormal findings of blood chemistry: Secondary | ICD-10-CM | POA: Diagnosis not present

## 2015-11-13 DIAGNOSIS — E7800 Pure hypercholesterolemia, unspecified: Secondary | ICD-10-CM

## 2015-11-13 DIAGNOSIS — R945 Abnormal results of liver function studies: Secondary | ICD-10-CM

## 2015-11-13 LAB — HEPATIC FUNCTION PANEL
ALT: 33 U/L (ref 0–35)
AST: 24 U/L (ref 0–37)
Albumin: 4.1 g/dL (ref 3.5–5.2)
Alkaline Phosphatase: 54 U/L (ref 39–117)
BILIRUBIN DIRECT: 0.2 mg/dL (ref 0.0–0.3)
BILIRUBIN TOTAL: 1.6 mg/dL — AB (ref 0.2–1.2)
TOTAL PROTEIN: 7.2 g/dL (ref 6.0–8.3)

## 2015-11-13 LAB — BASIC METABOLIC PANEL
BUN: 15 mg/dL (ref 6–23)
CHLORIDE: 101 meq/L (ref 96–112)
CO2: 30 mEq/L (ref 19–32)
Calcium: 9.4 mg/dL (ref 8.4–10.5)
Creatinine, Ser: 0.75 mg/dL (ref 0.40–1.20)
GFR: 83.26 mL/min (ref >60.00)
GLUCOSE: 99 mg/dL (ref 70–99)
POTASSIUM: 4.1 meq/L (ref 3.5–5.1)
SODIUM: 139 meq/L (ref 135–145)

## 2015-11-13 LAB — LIPID PANEL
Cholesterol: 217 mg/dL — ABNORMAL HIGH (ref 0–200)
HDL: 58.2 mg/dL (ref >39.00)
LDL CALC: 125 mg/dL — AB (ref 0–99)
NONHDL: 158.5
Total CHOL/HDL Ratio: 4
Triglycerides: 167 mg/dL — ABNORMAL HIGH (ref 0.0–149.0)
VLDL: 33.4 mg/dL (ref 0.0–40.0)

## 2015-11-13 LAB — HEMOGLOBIN A1C: Hgb A1c MFr Bld: 5.3 % (ref 4.6–6.5)

## 2015-11-14 ENCOUNTER — Encounter: Payer: Self-pay | Admitting: Internal Medicine

## 2015-11-26 ENCOUNTER — Other Ambulatory Visit: Payer: Self-pay

## 2015-11-26 MED ORDER — FLUOXETINE HCL 40 MG PO CAPS: 40.0000 mg | ORAL_CAPSULE | Freq: Every day | ORAL | 5 refills | Status: DC

## 2015-11-26 MED ORDER — FLUOXETINE HCL 40 MG PO CAPS: 40.0000 mg | ORAL_CAPSULE | Freq: Every day | ORAL | 1 refills | Status: DC

## 2015-12-03 ENCOUNTER — Ambulatory Visit
Admission: RE | Admit: 2015-12-03 | Discharge: 2015-12-03 | Disposition: A | Discharge status: Home / Self Care | Payer: BC Managed Care – PPO | Source: Ambulatory Visit | Attending: Internal Medicine | Admitting: Internal Medicine

## 2015-12-03 DIAGNOSIS — Z1231 Encounter for screening mammogram for malignant neoplasm of breast: Secondary | ICD-10-CM | POA: Insufficient documentation

## 2015-12-13 ENCOUNTER — Encounter: Payer: BC Managed Care – PPO | Admitting: Internal Medicine

## 2015-12-23 ENCOUNTER — Other Ambulatory Visit: Payer: Self-pay | Admitting: Internal Medicine

## 2015-12-23 NOTE — Telephone Encounter (Signed)
Waiting for signature

## 2015-12-23 NOTE — Telephone Encounter (Signed)
Last filled 10/18/15 30 0 rf

## 2016-03-10 ENCOUNTER — Ambulatory Visit (INDEPENDENT_AMBULATORY_CARE_PROVIDER_SITE_OTHER): Payer: BC Managed Care – PPO | Admitting: Internal Medicine

## 2016-03-10 ENCOUNTER — Encounter: Payer: Self-pay | Admitting: Internal Medicine

## 2016-03-10 ENCOUNTER — Ambulatory Visit (INDEPENDENT_AMBULATORY_CARE_PROVIDER_SITE_OTHER): Payer: BC Managed Care – PPO

## 2016-03-10 VITALS — BP 120/62 | HR 83 | Temp 98.6°F | Resp 16 | Ht 68.0 in | Wt 204.0 lb

## 2016-03-10 DIAGNOSIS — R945 Abnormal results of liver function studies: Secondary | ICD-10-CM

## 2016-03-10 DIAGNOSIS — E7800 Pure hypercholesterolemia, unspecified: Secondary | ICD-10-CM

## 2016-03-10 DIAGNOSIS — R002 Palpitations: Secondary | ICD-10-CM

## 2016-03-10 DIAGNOSIS — M545 Low back pain, unspecified: Secondary | ICD-10-CM

## 2016-03-10 DIAGNOSIS — Z0000 Encounter for general adult medical examination without abnormal findings: Secondary | ICD-10-CM

## 2016-03-10 DIAGNOSIS — R7989 Other specified abnormal findings of blood chemistry: Secondary | ICD-10-CM

## 2016-03-10 DIAGNOSIS — C439 Malignant melanoma of skin, unspecified: Secondary | ICD-10-CM | POA: Diagnosis not present

## 2016-03-10 DIAGNOSIS — R739 Hyperglycemia, unspecified: Secondary | ICD-10-CM

## 2016-03-10 DIAGNOSIS — F419 Anxiety disorder, unspecified: Secondary | ICD-10-CM

## 2016-03-10 DIAGNOSIS — I10 Essential (primary) hypertension: Secondary | ICD-10-CM

## 2016-03-10 MED ORDER — TRIAMTERENE-HCTZ 37.5-25 MG PO TABS: 1.0000 | ORAL_TABLET | Freq: Every day | ORAL | 1 refills | Status: DC

## 2016-03-10 MED ORDER — ENALAPRIL MALEATE 5 MG PO TABS: 5.0000 mg | ORAL_TABLET | Freq: Every day | ORAL | 1 refills | Status: DC

## 2016-03-10 NOTE — Progress Notes (Signed)
Pre-visit discussion using our clinic review tool. No additional management support is needed unless otherwise documented below in the visit note.  

## 2016-03-10 NOTE — Progress Notes (Addendum)
Patient ID: Jordan Benson, female   DOB: 19-Jul-1955, 61 y.o.   MRN: [id number]   Subjective:    Patient ID: Jordan Benson, female    DOB: [birth year], 61 y.o.   MRN: [id number]  HPI  Patient here for her physical exam.  She reports she is doing better.  Feels better.  Has cut out increased alcohol.  Palpitations have resolved.  No chest pain.  Tries to stay active.  Fell down the stairs this am.  Pain in her low back.  No head injury.  No other injury.  Increased pain with certain movements or when sitting for a while and then standing.  No pain radiating down her leg.  No numbness and tingling.  No bowel or bladder issues.  Overall handling stress better.    Past Medical History:  Diagnosis Date  . Anxiety   . GERD (gastroesophageal reflux disease)   . Hypertension   . IBS (irritable bowel syndrome)   . Melanoma ([place])    followed by Dr Evorn Gong  . Sleep apnea   . Vitamin D deficiency    Past Surgical History:  Procedure Laterality Date  . ABDOMINAL HYSTERECTOMY  1992  . MOHS SURGERY  2012  . OVARIAN CYST REMOVAL  1991  . SEPTOPLASTY  2009  . TUBAL LIGATION  1985  . tummy tuck  1995  . WISDOM TOOTH EXTRACTION     Family History  Problem Relation Age of Onset  . Cancer Mother 25    breast cancer  . Heart disease Mother   . Hypertension Mother   . Diabetes Mother   . Breast cancer Mother 101  . Heart disease Father    Social History   Social History  . Marital status: Divorced    Spouse name: N/A  . Number of children: 2  . Years of education: N/A   Occupational History  .  [employer]   Social History Main Topics  . Smoking status: Former Research scientist (life sciences)  . Smokeless tobacco: Never Used  . Alcohol use 0.0 oz/week  . Drug use: No  . Sexual activity: Not Asked   Other Topics Concern  . None   Social History Narrative  . None    Outpatient Encounter Prescriptions as of 03/10/2016  Medication Sig  . ALPRAZolam (XANAX) 0.25 MG tablet TAKE 1 TABLET BY MOUTH DAILY AS  NEEDED FOR ANXIETY  . beta carotene w/minerals (OCUVITE) tablet Take 1 tablet by mouth daily.  . Calcium Carb-Cholecalciferol (CALCIUM + D3 PO) Take by mouth daily.  . enalapril (VASOTEC) 5 MG tablet TAKE 1 TABLET (5 MG TOTAL) BY MOUTH DAILY.  Marland Kitchen enalapril (VASOTEC) 5 MG tablet Take 1 tablet (5 mg total) by mouth daily.  Marland Kitchen FLUoxetine (PROZAC) 40 MG capsule Take 1 capsule (40 mg total) by mouth daily.  Marland Kitchen triamterene-hydrochlorothiazide (MAXZIDE-25) 37.5-25 MG tablet Take 1 tablet by mouth daily.  Marland Kitchen triamterene-hydrochlorothiazide (MAXZIDE-25) 37.5-25 MG tablet Take 1 tablet by mouth daily.  . valACYclovir (VALTREX) 1000 MG tablet Take 1 tablet (1,000 mg total) by mouth as needed.  . [DISCONTINUED] enalapril (VASOTEC) 5 MG tablet Take 1 tablet (5 mg total) by mouth daily.  . [DISCONTINUED] triamterene-hydrochlorothiazide (MAXZIDE-25) 37.5-25 MG tablet Take 1 tablet by mouth daily.   No facility-administered encounter medications on file as of 03/10/2016.     Review of Systems  Constitutional: Negative for appetite change and unexpected weight change.  HENT: Negative for congestion and sinus pressure.   Respiratory: Negative for cough, chest tightness  and shortness of breath.   Cardiovascular: Negative for chest pain, palpitations and leg swelling.  Gastrointestinal: Negative for abdominal pain, diarrhea, nausea and vomiting.  Genitourinary: Negative for difficulty urinating and dysuria.  Musculoskeletal: Positive for back pain. Negative for joint swelling and myalgias.  Skin: Negative for color change and rash.  Neurological: Negative for dizziness, light-headedness and headaches.  Psychiatric/Behavioral: Negative for agitation and dysphoric mood.       Objective:     Blood pressure rechecked by me:  128/78  Physical Exam  Constitutional: She appears well-developed and well-nourished. No distress.  HENT:  Nose: Nose normal.  Mouth/Throat: Oropharynx is clear and moist.  Neck: Neck  supple. No thyromegaly present.  Cardiovascular: Normal rate and regular rhythm.   Pulmonary/Chest: Breath sounds normal. No respiratory distress. She has no wheezes.  Breast exam - no nipple discharge or nipple retraction present.  Could not appreciate any axillary adenopathy or distinct nodules.    Abdominal: Soft. Bowel sounds are normal. There is no tenderness.  Musculoskeletal: She exhibits no edema or tenderness.  Increased pain lower back with going from sitting to standing position.  Able to walk without significant difficulty.  Increased pain with sitting to lying position and raising up.    Lymphadenopathy:    She has no cervical adenopathy.  Skin: No rash noted. No erythema.  Psychiatric: She has a normal mood and affect. Her behavior is normal.    BP 120/62 (BP Location: Left Arm, Patient Position: Sitting, Cuff Size: Large)   Pulse 83   Temp 98.6 F (37 C) (Oral)   Resp 16   Ht 5\' 8"  (1.727 m)   Wt 204 lb (92.5 kg)   SpO2 96%   BMI 31.02 kg/m  Wt Readings from Last 3 Encounters:  03/10/16 204 lb (92.5 kg)  09/09/15 202 lb 12 oz (92 kg)  03/05/15 200 lb 8 oz (90.9 kg)     Lab Results  Component Value Date   WBC 6.0 12/25/2014   HGB 13.9 12/25/2014   HCT 41.3 12/25/2014   PLT 274.0 12/25/2014   GLUCOSE 99 11/13/2015   CHOL 217 (H) 11/13/2015   TRIG 167.0 (H) 11/13/2015   HDL 58.20 11/13/2015   LDLCALC 125 (H) 11/13/2015   ALT 33 11/13/2015   AST 24 11/13/2015   NA 139 11/13/2015   K 4.1 11/13/2015   CL 101 11/13/2015   CREATININE 0.75 11/13/2015   BUN 15 11/13/2015   CO2 30 11/13/2015   TSH 1.80 12/25/2014   HGBA1C 5.3 11/13/2015    Mm Screening Breast Tomo Bilateral  Result Date: 12/03/2015 CLINICAL DATA:  Screening. EXAM: 2D DIGITAL SCREENING BILATERAL MAMMOGRAM WITH CAD AND ADJUNCT TOMO COMPARISON:  Previous exam(s). ACR Breast Density Category b: There are scattered areas of fibroglandular density. FINDINGS: There are no findings suspicious for  malignancy. Images were processed with CAD. IMPRESSION: No mammographic evidence of malignancy. A result letter of this screening mammogram will be mailed directly to the patient. RECOMMENDATION: Screening mammogram in one year. (Code:SM-B-01Y) BI-RADS CATEGORY  1: Negative. Electronically Signed   By: Franki Cabot M.D.   On: 12/03/2015 14:48       Assessment & Plan:   Problem List Items Addressed This Visit    Abnormal liver function test    Follow liver panel.        Anxiety    On prozac.  Has xanax if needed.  Doing better.  Follow.        Essential hypertension,  benign    Blood pressure doing well.  Follow pressures.  Same medication regimen.  Follow metabolic panel.        Relevant Medications   enalapril (VASOTEC) 5 MG tablet   triamterene-hydrochlorothiazide (MAXZIDE-25) 37.5-25 MG tablet   Other Relevant Orders   CBC with Differential/Platelet   TSH   Basic metabolic panel   Health care maintenance    Physical today 03/10/16.  S/p hysterectomy.  Mammogram 12/03/15 - Birads I.        Hypercholesterolemia    Low cholesterol diet and exercise.  Follow lipid panel.        Relevant Medications   enalapril (VASOTEC) 5 MG tablet   triamterene-hydrochlorothiazide (MAXZIDE-25) 37.5-25 MG tablet   Other Relevant Orders   Lipid panel   Melanoma ([place])    Followed by Dr Evorn Gong.        Relevant Orders   Hepatic function panel   Palpitations    Resolved after cutting down on alcohol.         Other Visit Diagnoses    Acute midline low back pain without sciatica    -  Primary   pain s/p fall.  exam as outlined. check xray.  continue ice.     Relevant Orders   DG Lumbar Spine 2-3 Views (Completed)   DG Pelvis 1-2 Views (Completed)   Hyperglycemia       Relevant Orders   Hemoglobin A1c       Einar Pheasant, MD

## 2016-03-11 ENCOUNTER — Encounter: Payer: Self-pay | Admitting: Internal Medicine

## 2016-03-15 ENCOUNTER — Encounter: Payer: Self-pay | Admitting: Internal Medicine

## 2016-03-15 NOTE — Assessment & Plan Note (Signed)
Follow liver panel.  

## 2016-03-15 NOTE — Assessment & Plan Note (Signed)
Blood pressure doing well.  Follow pressures.  Same medication regimen.  Follow metabolic panel.   

## 2016-03-15 NOTE — Assessment & Plan Note (Signed)
Physical today 03/10/16.  S/p hysterectomy.  Mammogram 12/03/15 - Birads I.

## 2016-03-15 NOTE — Assessment & Plan Note (Signed)
Resolved after cutting down on alcohol.

## 2016-03-15 NOTE — Assessment & Plan Note (Signed)
Followed by Dr Dasher.   

## 2016-03-15 NOTE — Assessment & Plan Note (Signed)
On prozac.  Has xanax if needed.  Doing better.  Follow.

## 2016-03-15 NOTE — Assessment & Plan Note (Signed)
Low cholesterol diet and exercise.  Follow lipid panel.   

## 2016-05-08 ENCOUNTER — Other Ambulatory Visit: Payer: Self-pay | Admitting: Internal Medicine

## 2016-05-14 ENCOUNTER — Telehealth: Payer: Self-pay | Admitting: Internal Medicine

## 2016-05-14 ENCOUNTER — Other Ambulatory Visit: Payer: Self-pay | Admitting: Internal Medicine

## 2016-05-14 ENCOUNTER — Other Ambulatory Visit: Payer: Self-pay

## 2016-05-14 MED ORDER — FLUOXETINE HCL 40 MG PO CAPS: 40.0000 mg | ORAL_CAPSULE | Freq: Every day | ORAL | 1 refills | Status: DC

## 2016-05-14 NOTE — Telephone Encounter (Signed)
Patient Name: Jordan Benson DOB: Nov 18, 1955 Initial Comment Caller's knee is sore, has been for 5-6 weeks, brace helps a little but not much Nurse Assessment Nurse: Vallery Sa, RN, [place] Date/Time (Eastern Time): 05/14/2016 9:05:21 AM Confirm and document reason for call. If symptomatic, describe symptoms. ---Caller states she had right knee pain for the past 5 weeks. She rates her pain as a 5 on the 1 to 10 scale. She feel on stairs about 5 weeks ago and hasn't had medical attention. No swelling or redness. Alert and responsive. Does the patient have any new or worsening symptoms? ---Yes Will a triage be completed? ---Yes Related visit to physician within the last 2 weeks? ---No Does the PT have any chronic conditions? (i.e. diabetes, asthma, etc.) ---Yes List chronic conditions. ---High Blood Pressure, Anxiety Is this a behavioral health or substance abuse call? ---No Guidelines Guideline Title Affirmed Question Affirmed Notes Knee Injury Knee giving way (or buckling) when walking Final Disposition User See Physician within 24 Hours [place], RN, [place] states she has an appointment scheduled with MD tomorrow at 8:30am. Referrals REFERRED TO PCP OFFICE Disagree/Comply: Comply

## 2016-05-14 NOTE — Telephone Encounter (Signed)
fyi

## 2016-05-14 NOTE — Telephone Encounter (Signed)
Pt called about having right knee pain and it is hard for her to walk. Pt is scheduled to come in tomorrow. Pt was also transferred to team health. Thank you!

## 2016-05-15 ENCOUNTER — Other Ambulatory Visit: Payer: Self-pay | Admitting: Internal Medicine

## 2016-05-15 ENCOUNTER — Ambulatory Visit (INDEPENDENT_AMBULATORY_CARE_PROVIDER_SITE_OTHER): Payer: BC Managed Care – PPO | Admitting: Internal Medicine

## 2016-05-15 ENCOUNTER — Encounter: Payer: Self-pay | Admitting: Internal Medicine

## 2016-05-15 ENCOUNTER — Ambulatory Visit (INDEPENDENT_AMBULATORY_CARE_PROVIDER_SITE_OTHER): Payer: BC Managed Care – PPO

## 2016-05-15 DIAGNOSIS — E7800 Pure hypercholesterolemia, unspecified: Secondary | ICD-10-CM | POA: Diagnosis not present

## 2016-05-15 DIAGNOSIS — M25561 Pain in right knee: Secondary | ICD-10-CM | POA: Diagnosis not present

## 2016-05-15 DIAGNOSIS — I10 Essential (primary) hypertension: Secondary | ICD-10-CM

## 2016-05-15 DIAGNOSIS — R739 Hyperglycemia, unspecified: Secondary | ICD-10-CM | POA: Diagnosis not present

## 2016-05-15 DIAGNOSIS — R945 Abnormal results of liver function studies: Secondary | ICD-10-CM

## 2016-05-15 DIAGNOSIS — R7989 Other specified abnormal findings of blood chemistry: Secondary | ICD-10-CM

## 2016-05-15 DIAGNOSIS — C439 Malignant melanoma of skin, unspecified: Secondary | ICD-10-CM

## 2016-05-15 LAB — HEPATIC FUNCTION PANEL
ALK PHOS: 59 U/L (ref 39–117)
ALT: 40 U/L — AB (ref 0–35)
AST: 25 U/L (ref 0–37)
Albumin: 4.4 g/dL (ref 3.5–5.2)
BILIRUBIN TOTAL: 1.5 mg/dL — AB (ref 0.2–1.2)
Bilirubin, Direct: 0.2 mg/dL (ref 0.0–0.3)
Total Protein: 7 g/dL (ref 6.0–8.3)

## 2016-05-15 LAB — CBC WITH DIFFERENTIAL/PLATELET
BASOS PCT: 1.3 % (ref 0.0–3.0)
Basophils Absolute: 0.1 10*3/uL (ref 0.0–0.1)
EOS PCT: 3.3 % (ref 0.0–5.0)
Eosinophils Absolute: 0.2 10*3/uL (ref 0.0–0.7)
HCT: 42.1 % (ref 36.0–46.0)
Hemoglobin: 14.5 g/dL (ref 12.0–15.0)
LYMPHS ABS: 2 10*3/uL (ref 0.7–4.0)
LYMPHS PCT: 37.6 % (ref 12.0–46.0)
MCHC: 34.5 g/dL (ref 30.0–36.0)
MCV: 92.3 fl (ref 78.0–100.0)
MONOS PCT: 6.4 % (ref 3.0–12.0)
Monocytes Absolute: 0.3 10*3/uL (ref 0.1–1.0)
NEUTROS ABS: 2.7 10*3/uL (ref 1.4–7.7)
NEUTROS PCT: 51.4 % (ref 43.0–77.0)
Platelets: 281 10*3/uL (ref 150.0–400.0)
RBC: 4.57 Mil/uL (ref 3.87–5.11)
RDW: 12.1 % (ref 11.5–15.5)
WBC: 5.3 10*3/uL (ref 4.0–10.5)

## 2016-05-15 LAB — BASIC METABOLIC PANEL
BUN: 17 mg/dL (ref 6–23)
CALCIUM: 9.7 mg/dL (ref 8.4–10.5)
CO2: 28 mEq/L (ref 19–32)
Chloride: 101 mEq/L (ref 96–112)
Creatinine, Ser: 0.78 mg/dL (ref 0.40–1.20)
GFR: 79.45 mL/min (ref >60.00)
Glucose, Bld: 107 mg/dL — ABNORMAL HIGH (ref 70–99)
Potassium: 4 mEq/L (ref 3.5–5.1)
Sodium: 137 mEq/L (ref 135–145)

## 2016-05-15 LAB — LIPID PANEL
CHOL/HDL RATIO: 4
Cholesterol: 247 mg/dL — ABNORMAL HIGH (ref 0–200)
HDL: 59 mg/dL (ref >39.00)
LDL Cholesterol: 155 mg/dL — ABNORMAL HIGH (ref 0–99)
NONHDL: 187.76
Triglycerides: 162 mg/dL — ABNORMAL HIGH (ref 0.0–149.0)
VLDL: 32.4 mg/dL (ref 0.0–40.0)

## 2016-05-15 LAB — TSH: TSH: 1.78 u[IU]/mL (ref 0.35–4.50)

## 2016-05-15 LAB — HEMOGLOBIN A1C: Hgb A1c MFr Bld: 5.4 % (ref 4.6–6.5)

## 2016-05-15 NOTE — Progress Notes (Signed)
Pre-visit discussion using our clinic review tool. No additional management support is needed unless otherwise documented below in the visit note.  

## 2016-05-15 NOTE — Progress Notes (Signed)
Patient ID: Jordan Benson, female   DOB: [birth year], 61 y.o.   MRN: [id number]   Subjective:    Patient ID: Jordan Benson, female    DOB: 02/05/1956, 61 y.o.   MRN: [id number]  HPI  Patient here as work in with concerns regarding right knee pain s/p fall 02/10/16.  She did not have pain in her knee until one week after the fall.  Pain has been persistent.  Localized to the right lateral knee.  Increased pain with ambulation.  Not exercising.  Not watching her diet.  She has been wearing a sleeve on her knee.  Takes alleve prn.  Helps when takes.  No acid reflux.  No abdominal pain.  Bowels moving.  Has been dring 2-3 martinis a night.  Discussed decreasing alcohol intake.     Past Medical History:  Diagnosis Date  . Anxiety   . GERD (gastroesophageal reflux disease)   . Hypertension   . IBS (irritable bowel syndrome)   . Melanoma ([place])    followed by Dr Evorn Gong  . Sleep apnea   . Vitamin D deficiency    Past Surgical History:  Procedure Laterality Date  . ABDOMINAL HYSTERECTOMY  1992  . MOHS SURGERY  2012  . OVARIAN CYST REMOVAL  1991  . SEPTOPLASTY  2009  . TUBAL LIGATION  1985  . tummy tuck  1995  . WISDOM TOOTH EXTRACTION     Family History  Problem Relation Age of Onset  . Cancer Mother 42    breast cancer  . Heart disease Mother   . Hypertension Mother   . Diabetes Mother   . Breast cancer Mother 48  . Heart disease Father    Social History   Social History  . Marital status: Divorced    Spouse name: N/A  . Number of children: 2  . Years of education: N/A   Occupational History  .  [employer]   Social History Main Topics  . Smoking status: Former Research scientist (life sciences)  . Smokeless tobacco: Never Used  . Alcohol use 0.0 oz/week  . Drug use: No  . Sexual activity: Not Asked   Other Topics Concern  . None   Social History Narrative  . None    Outpatient Encounter Prescriptions as of 05/15/2016  Medication Sig  . ALPRAZolam (XANAX) 0.25 MG tablet TAKE 1 TABLET BY  MOUTH DAILY AS NEEDED FOR ANXIETY  . beta carotene w/minerals (OCUVITE) tablet Take 1 tablet by mouth daily.  . Calcium Carb-Cholecalciferol (CALCIUM + D3 PO) Take by mouth daily.  . enalapril (VASOTEC) 5 MG tablet TAKE 1 TABLET (5 MG TOTAL) BY MOUTH DAILY.  Marland Kitchen FLUoxetine (PROZAC) 40 MG capsule Take 1 capsule (40 mg total) by mouth daily.  Marland Kitchen triamterene-hydrochlorothiazide (MAXZIDE-25) 37.5-25 MG tablet TAKE 1 TABLET BY MOUTH DAILY.  . valACYclovir (VALTREX) 1000 MG tablet Take 1 tablet (1,000 mg total) by mouth as needed.   No facility-administered encounter medications on file as of 05/15/2016.     Review of Systems  Constitutional: Negative for appetite change and unexpected weight change.  HENT: Negative for congestion and sinus pressure.   Respiratory: Negative for cough, chest tightness and shortness of breath.   Cardiovascular: Negative for chest pain, palpitations and leg swelling.  Gastrointestinal: Negative for abdominal pain, diarrhea, nausea and vomiting.  Genitourinary: Negative for difficulty urinating and dysuria.  Musculoskeletal: Negative for back pain.       Right knee pain as outlined.    Skin: Negative for color  change and rash.  Neurological: Negative for dizziness, light-headedness and headaches.  Psychiatric/Behavioral: Negative for agitation and dysphoric mood.       Objective:    Physical Exam  Constitutional: She appears well-developed and well-nourished. No distress.  HENT:  Nose: Nose normal.  Mouth/Throat: Oropharynx is clear and moist.  Neck: Neck supple. No thyromegaly present.  Cardiovascular: Normal rate and regular rhythm.   Pulmonary/Chest: Breath sounds normal. No respiratory distress. She has no wheezes.  Abdominal: Soft. Bowel sounds are normal. There is no tenderness.  Musculoskeletal:  Increased pain to palpation right lateral knee.  Some pain with resistance against flexion.  No increased swelling or redness.    Lymphadenopathy:    She has  no cervical adenopathy.  Skin: No rash noted. No erythema.  Psychiatric: She has a normal mood and affect. Her behavior is normal.    BP 140/68 (BP Location: Left Arm, Patient Position: Sitting, Cuff Size: Large)   Pulse 70   Temp 98.6 F (37 C) (Oral)   Resp 14   Ht 5\' 9"  (1.753 m) Comment: per patient  SpO2 97%  Wt Readings from Last 3 Encounters:  03/10/16 204 lb (92.5 kg)  09/09/15 202 lb 12 oz (92 kg)  03/05/15 200 lb 8 oz (90.9 kg)     Lab Results  Component Value Date   WBC 5.3 05/15/2016   HGB 14.5 05/15/2016   HCT 42.1 05/15/2016   PLT 281.0 05/15/2016   GLUCOSE 107 (H) 05/15/2016   CHOL 247 (H) 05/15/2016   TRIG 162.0 (H) 05/15/2016   HDL 59.00 05/15/2016   LDLCALC 155 (H) 05/15/2016   ALT 40 (H) 05/15/2016   AST 25 05/15/2016   NA 137 05/15/2016   K 4.0 05/15/2016   CL 101 05/15/2016   CREATININE 0.78 05/15/2016   BUN 17 05/15/2016   CO2 28 05/15/2016   TSH 1.78 05/15/2016   HGBA1C 5.4 05/15/2016    Mm Screening Breast Tomo Bilateral  Result Date: 12/03/2015 CLINICAL DATA:  Screening. EXAM: 2D DIGITAL SCREENING BILATERAL MAMMOGRAM WITH CAD AND ADJUNCT TOMO COMPARISON:  Previous exam(s). ACR Breast Density Category b: There are scattered areas of fibroglandular density. FINDINGS: There are no findings suspicious for malignancy. Images were processed with CAD. IMPRESSION: No mammographic evidence of malignancy. A result letter of this screening mammogram will be mailed directly to the patient. RECOMMENDATION: Screening mammogram in one year. (Code:SM-B-01Y) BI-RADS CATEGORY  1: Negative. Electronically Signed   By: Franki Cabot M.D.   On: 12/03/2015 14:48       Assessment & Plan:   Problem List Items Addressed This Visit    Abnormal liver function test    Discussed diet and exercise.  Discussed decreasing alcohol intake.  Follow.  Recheck liver panel.       Essential hypertension, benign    Blood pressure elevated initially.  Recheck improved.  Have  her spot check her pressure.  Follow.       Hypercholesterolemia    Low cholesterol diet and exercise.  Follow lipid panel.       Melanoma ([place])   Right knee pain    Persistent increased pain.  Tylenol and gentle use of antiinflammatories.  Check xray.  Further w/up pending results.        Relevant Orders   DG Knee 1-2 Views Right (Completed)    Other Visit Diagnoses    Hyperglycemia           Einar Pheasant, MD

## 2016-05-16 ENCOUNTER — Encounter: Payer: Self-pay | Admitting: Internal Medicine

## 2016-05-16 DIAGNOSIS — R7989 Other specified abnormal findings of blood chemistry: Secondary | ICD-10-CM

## 2016-05-16 DIAGNOSIS — R945 Abnormal results of liver function studies: Secondary | ICD-10-CM

## 2016-05-16 DIAGNOSIS — M25561 Pain in right knee: Secondary | ICD-10-CM

## 2016-05-17 ENCOUNTER — Encounter: Payer: Self-pay | Admitting: Internal Medicine

## 2016-05-17 NOTE — Assessment & Plan Note (Signed)
Blood pressure elevated initially.  Recheck improved.  Have her spot check her pressure.  Follow.

## 2016-05-17 NOTE — Assessment & Plan Note (Signed)
Persistent increased pain.  Tylenol and gentle use of antiinflammatories.  Check xray.  Further w/up pending results.

## 2016-05-17 NOTE — Assessment & Plan Note (Signed)
Low cholesterol diet and exercise.  Follow lipid panel.   

## 2016-05-17 NOTE — Assessment & Plan Note (Signed)
Discussed diet and exercise.  Discussed decreasing alcohol intake.  Follow.  Recheck liver panel.

## 2016-05-18 NOTE — Telephone Encounter (Signed)
Order placed for ortho referral.   

## 2016-05-18 NOTE — Telephone Encounter (Signed)
Order placed for abdominal ultrasound.   

## 2016-05-25 DIAGNOSIS — K760 Fatty (change of) liver, not elsewhere classified: Secondary | ICD-10-CM

## 2016-05-25 DIAGNOSIS — R7989 Other specified abnormal findings of blood chemistry: Secondary | ICD-10-CM

## 2016-05-25 DIAGNOSIS — R945 Abnormal results of liver function studies: Secondary | ICD-10-CM

## 2016-06-02 NOTE — Telephone Encounter (Signed)
She had it done at [place] [facility]. I have called and they are faxing it over.

## 2016-06-02 NOTE — Telephone Encounter (Signed)
I do not have the results of the ultrasound.  Was this scheduled at [facility]?  If so, please forward to [facility] and will have her get results.  Thanks

## 2016-06-04 NOTE — Telephone Encounter (Signed)
Order placed for GI referral.   

## 2016-06-16 ENCOUNTER — Encounter: Payer: Self-pay | Admitting: Internal Medicine

## 2016-06-16 NOTE — Telephone Encounter (Signed)
Please call pt and confirm otherwise doing ok.  She is currently taking enalapril 5mg  q day.  If this is correct, then have her increase to 5mg  bid.  Continue to record pressures.  Check and call in over the next 2 weeks.  If feels needs appt, let me know and can work in.  Thanks

## 2016-06-17 ENCOUNTER — Other Ambulatory Visit: Payer: Self-pay | Admitting: Internal Medicine

## 2016-06-17 MED ORDER — ENALAPRIL MALEATE 5 MG PO TABS: 5.0000 mg | ORAL_TABLET | Freq: Two times a day (BID) | ORAL | 0 refills | Status: DC

## 2016-06-17 NOTE — Telephone Encounter (Signed)
Patient notified and voiced understanding Patient to increase enalapril to 5 mg bid, will record BP over next 2 weeks and send readings to office. Advised patient if trending any higher to call office. Updated chart with new dose and sent script with correct dosage as requested.

## 2016-07-01 ENCOUNTER — Ambulatory Visit (INDEPENDENT_AMBULATORY_CARE_PROVIDER_SITE_OTHER): Payer: BC Managed Care – PPO | Admitting: Gastroenterology

## 2016-07-01 ENCOUNTER — Encounter: Payer: Self-pay | Admitting: Gastroenterology

## 2016-07-01 ENCOUNTER — Other Ambulatory Visit: Payer: Self-pay

## 2016-07-01 VITALS — BP 143/71 | HR 76 | Temp 98.3°F | Ht 68.0 in | Wt 202.0 lb

## 2016-07-01 DIAGNOSIS — K760 Fatty (change of) liver, not elsewhere classified: Secondary | ICD-10-CM

## 2016-07-01 DIAGNOSIS — K7689 Other specified diseases of liver: Secondary | ICD-10-CM

## 2016-07-01 DIAGNOSIS — R945 Abnormal results of liver function studies: Secondary | ICD-10-CM

## 2016-07-02 NOTE — Progress Notes (Signed)
Gastroenterology Consultation  Referring Provider:     Einar Pheasant, MD Primary Care Physician:  Einar Pheasant, MD Primary Gastroenterologist:  Dr. Allen Norris     Reason for Consultation:     Fatty liver        HPI:   Jordan Benson is a 61 y.o. y/o female referred for consultation & management of Fatty liver by Dr. Nicki Reaper, Jordan Patient, MD.  This Benson comes in today with a history of fatty liver found on ultrasound. The Benson states that she loves her martinis and although she does not believe she drinks too much she does have the occasional drink. The Benson reports she can have a few drinks a night on the weekend. The Benson was found to have an elevated ALT with a history of abnormal liver enzymes back before 2017. The Benson's labs in 2017 were totally normal 2. The Benson's most recent labs showed the bilirubin to be 1.5 but most of it was indirect in the direct was normal. The Benson's AST was 25 at the last blood test and ALT was 40. The Benson states she has been under a lot of stress with a sick spouse. There is no report of any unexplained weight loss fevers chills nausea or vomiting. The Benson also denies any new medications or herbal remedies.  Past Medical History:  Diagnosis Date  . Anxiety   . GERD (gastroesophageal reflux disease)   . Hypertension   . IBS (irritable bowel syndrome)   . Melanoma ([place])    followed by Dr Evorn Gong  . Sleep apnea   . Vitamin D deficiency     Past Surgical History:  Procedure Laterality Date  . ABDOMINAL HYSTERECTOMY  1992  . MOHS SURGERY  2012  . OVARIAN CYST REMOVAL  1991  . SEPTOPLASTY  2009  . TUBAL LIGATION  1985  . tummy tuck  1995  . WISDOM TOOTH EXTRACTION      Prior to Admission medications   Medication Sig Start Date End Date Taking? Authorizing Provider  ALPRAZolam (XANAX) 0.25 MG tablet TAKE 1 TABLET BY MOUTH DAILY AS NEEDED FOR ANXIETY 06/18/16  Yes Einar Pheasant, MD  aspirin EC 81 MG tablet Take 81 mg by  mouth daily.   Yes [provider]  beta carotene w/minerals (OCUVITE) tablet Take 1 tablet by mouth daily.   Yes [provider]  Calcium Carb-Cholecalciferol (CALCIUM + D3 PO) Take by mouth daily.   Yes [provider]  enalapril (VASOTEC) 5 MG tablet Take 1 tablet (5 mg total) by mouth 2 (two) times daily. 06/17/16  Yes Einar Pheasant, MD  FLUoxetine (PROZAC) 40 MG capsule Take 1 capsule (40 mg total) by mouth daily. 05/14/16  Yes Einar Pheasant, MD  triamterene-hydrochlorothiazide (MAXZIDE-25) 37.5-25 MG tablet TAKE 1 TABLET BY MOUTH DAILY. 05/11/16  Yes Einar Pheasant, MD  valACYclovir (VALTREX) 1000 MG tablet Take 1 tablet (1,000 mg total) by mouth as needed. 10/03/13  Yes Einar Pheasant, MD    Family History  Problem Relation Age of Onset  . Cancer Mother 68       breast cancer  . Heart disease Mother   . Hypertension Mother   . Diabetes Mother   . Breast cancer Mother 10  . Heart disease Father      Social History  Substance Use Topics  . Smoking status: Former Research scientist (life sciences)  . Smokeless tobacco: Never Used  . Alcohol use 0.0 oz/week    Allergies as of 07/01/2016  . (No  Known Allergies)    Review of Systems:    All systems reviewed and negative except where noted in HPI.   Physical Exam:  BP (!) 143/71   Pulse 76   Temp 98.3 F (36.8 C) (Oral)   Ht 5\' 8"  (1.727 m)   Wt 202 lb (91.6 kg)   BMI 30.71 kg/m  No LMP recorded. Benson has had a hysterectomy. Psych:  Alert and cooperative. Normal mood and affect. General:   Alert,  Well-developed, well-nourished, pleasant and cooperative in NAD Head:  Normocephalic and atraumatic. Eyes:  Sclera clear, no icterus.   Conjunctiva pink. Ears:  Normal auditory acuity. Nose:  No deformity, discharge, or lesions. Mouth:  No deformity or lesions,oropharynx pink & moist. Neck:  Supple; no masses or thyromegaly. Lungs:  Respirations even and unlabored.  Clear throughout to auscultation.   No wheezes,  crackles, or rhonchi. No acute distress. Heart:  Regular rate and rhythm; no murmurs, clicks, rubs, or gallops. Abdomen:  Normal bowel sounds.  No bruits.  Soft, non-tender and non-distended without masses, hepatosplenomegaly or hernias noted.  No guarding or rebound tenderness.  Negative Carnett sign.   Rectal:  Deferred.  Msk:  Symmetrical without gross deformities.  Good, equal movement & strength bilaterally. Pulses:  Normal pulses noted. Extremities:  No clubbing or edema.  No cyanosis. Neurologic:  Alert and oriented x3;  grossly normal neurologically. Skin:  Intact without significant lesions or rashes.  No jaundice. Lymph Nodes:  No significant cervical adenopathy. Psych:  Alert and cooperative. Normal mood and affect.  Imaging Studies: No results found.  Assessment and Plan:   Jordan Benson is a 61 y.o. y/o female who comes in today with a elevated ALT. The Benson may have the elevated ALT from drinking although alcoholic hepatitis is usually with an increased AST. 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 The Benson will have her blood sent off for other possible causes of abnormal liver enzymes although the fatty liver is likely the cause of her abnormal liver enzyme. The Benson will be contacted with results of her labs when they are back. Until then the Benson has been told to try to lose weight and avoid alcohol that her liver enzymes can come back to normal. The Benson has been explained the plan and agrees with it.  Jordan Lame, MD. Marval Regal   Note: This dictation was prepared with Dragon dictation along with smaller phrase technology. Any transcriptional errors that result from this process are unintentional.

## 2016-08-21 ENCOUNTER — Encounter: Payer: Self-pay | Admitting: Internal Medicine

## 2016-08-21 NOTE — Telephone Encounter (Signed)
Does she have any other blood pressure readings - other than just today.  Would need to look at average blood pressure.  I do agree with need for evaluation.  I am not here for part of next week.  If acute symptoms, evaluation today just to make sure nothing acute going on and then can f/u after.

## 2016-08-26 ENCOUNTER — Ambulatory Visit: Payer: BC Managed Care – PPO | Admitting: Family

## 2016-08-27 ENCOUNTER — Encounter: Payer: Self-pay | Admitting: Family

## 2016-08-27 ENCOUNTER — Ambulatory Visit (INDEPENDENT_AMBULATORY_CARE_PROVIDER_SITE_OTHER): Payer: BC Managed Care – PPO | Admitting: Family

## 2016-08-27 VITALS — BP 146/84 | HR 77 | Temp 97.7°F | Ht 68.0 in | Wt 202.2 lb

## 2016-08-27 DIAGNOSIS — I10 Essential (primary) hypertension: Secondary | ICD-10-CM | POA: Diagnosis not present

## 2016-08-27 DIAGNOSIS — R002 Palpitations: Secondary | ICD-10-CM | POA: Diagnosis not present

## 2016-08-27 NOTE — Progress Notes (Signed)
Pre visit review using our clinic review tool, if applicable. No additional management support is needed unless otherwise documented below in the visit note. 

## 2016-08-27 NOTE — Assessment & Plan Note (Addendum)
Elevated. No signs or symptoms of hypertensive urgency or emergency at this time. Patient is orthostatic (see flow sheet). Discussed the importance of hydration.  Notably, she is asymptomatic and does not complain of dizziness, syncopal episodes. We discussed the importance of treating her blood pressure as she is not a goal of 130/80. At this time we jointly agreed to increase in the enlapril 10 mg twice a day from 5 mg BID which she is currently on. Repeat BMP next week. We also discussed an alternative treatment and using metoprolol to control blood pressure as opposed to increase in enalapril due to history of palpitations/anxiety. Patient is pending trip this week like to follow up with our office after since she is interested in the metoprolol and will discuss further with her pcp.

## 2016-08-27 NOTE — Assessment & Plan Note (Addendum)
Chronic, daily. Reviewed prior workup with Dr. Ubaldo Glassing. improve with reduced caffeine dose. They seem to increase with anxiety. No palpitations while in exam room. Advised f/u with pcp if increase or new symptoms develop.

## 2016-08-27 NOTE — Progress Notes (Signed)
Subjective:    Patient ID: Jordan Benson, female    DOB: [birth year], 61 y.o.   MRN: [id number]  CC: Jordan Benson is a 61 y.o. female who presents today for an acute visit.    HPI: Elevated blood pressure over the past week when traveling. At home 142/87, 158/90 Taking 2nd dose of enlapril in the evenings. , maxide; HCTZ-triamterene 37.5/25mg . Couple of months ago started taking   Endorses 'alot of stress right now.' 'Worrier' over husband.    Also notes different blood pressure sitting, laying down and noted a 20 point difference which her cousin who is an NP took at home. Patient does not have any dizziness, syncopal episodes.  Denies exertional chest pain or pressure, numbness or tingling radiating to left arm or jaw, palpitations, dizziness, frequent headaches, changes in vision, or shortness of breath.   H/o tachycardia.  Trying to decrease salt. Cutting back on martini's ( notes 2 per day over the weekend).  Recently moved and not doing formal exercise over the past 2 months.   Depression and anxiety- prozac for year and doing well. Takes xanax at night for sleep, couple of times per week. No thoughts of hurting herself or anyone else     Fath 02/2015 - holter monitor showed SR with occasional PVCs, echo ( unable to result)- per patient, came back normal.  Has cut back on caffeine which has helped.   HISTORY:  Past Medical History:  Diagnosis Date  . Anxiety   . GERD (gastroesophageal reflux disease)   . Hypertension   . IBS (irritable bowel syndrome)   . Melanoma ([place])    followed by Dr Evorn Gong  . Sleep apnea   . Vitamin D deficiency    Past Surgical History:  Procedure Laterality Date  . ABDOMINAL HYSTERECTOMY  1992  . MOHS SURGERY  2012  . OVARIAN CYST REMOVAL  1991  . SEPTOPLASTY  2009  . TUBAL LIGATION  1985  . tummy tuck  1995  . WISDOM TOOTH EXTRACTION     Family History  Problem Relation Age of Onset  . Cancer Mother 53       breast cancer    . Heart disease Mother   . Hypertension Mother   . Diabetes Mother   . Breast cancer Mother 25  . Heart disease Father     Allergies: Patient has no known allergies. Current Outpatient Prescriptions on File Prior to Visit  Medication Sig Dispense Refill  . ALPRAZolam (XANAX) 0.25 MG tablet TAKE 1 TABLET BY MOUTH DAILY AS NEEDED FOR ANXIETY 30 tablet 0  . aspirin EC 81 MG tablet Take 81 mg by mouth daily.    . beta carotene w/minerals (OCUVITE) tablet Take 1 tablet by mouth daily.    . enalapril (VASOTEC) 5 MG tablet Take 1 tablet (5 mg total) by mouth 2 (two) times daily. 180 tablet 0  . FLUoxetine (PROZAC) 40 MG capsule Take 1 capsule (40 mg total) by mouth daily. 90 capsule 1  . triamterene-hydrochlorothiazide (MAXZIDE-25) 37.5-25 MG tablet TAKE 1 TABLET BY MOUTH DAILY. 90 tablet 0  . valACYclovir (VALTREX) 1000 MG tablet Take 1 tablet (1,000 mg total) by mouth as needed. 30 tablet 1   No current facility-administered medications on file prior to visit.     Social History  Substance Use Topics  . Smoking status: Former Research scientist (life sciences)  . Smokeless tobacco: Never Used  . Alcohol use 0.0 oz/week    Review of Systems  Constitutional: Negative  for chills and fever.  Eyes: Negative for visual disturbance.  Respiratory: Negative for cough.   Cardiovascular: Positive for palpitations (chronic). Negative for chest pain.  Gastrointestinal: Negative for nausea and vomiting.  Neurological: Negative for dizziness and syncope.  Psychiatric/Behavioral: Negative for suicidal ideas. The patient is nervous/anxious.       Objective:    BP (!) 146/84   Pulse 77   Temp 97.7 F (36.5 C) (Oral)   Ht 5\' 8"  (1.727 m)   Wt 202 lb 3.2 oz (91.7 kg)   SpO2 98%   BMI 30.74 kg/m  BP Readings from Last 3 Encounters:  08/27/16 (!) 146/84  07/01/16 (!) 143/71  05/15/16 140/68    Wt Readings from Last 3 Encounters:  08/27/16 202 lb 3.2 oz (91.7 kg)  07/01/16 202 lb (91.6 kg)  03/10/16 204 lb (92.5  kg)    Physical Exam  Constitutional: She appears well-developed and well-nourished.  Eyes: Conjunctivae are normal.  Cardiovascular: Normal rate, regular rhythm, normal heart sounds and normal pulses.   Pulmonary/Chest: Effort normal and breath sounds normal. She has no wheezes. She has no rhonchi. She has no rales.  Neurological: She is alert.  Skin: Skin is warm and dry.  Psychiatric: She has a normal mood and affect. Her speech is normal and behavior is normal. Thought content normal.  Vitals reviewed.      Assessment & Plan:   Problem List Items Addressed This Visit      Cardiovascular and Mediastinum   Essential hypertension, benign - Primary    Elevated. No signs or symptoms of hypertensive urgency or emergency at this time. Patient is orthostatic (see flow sheet). Discussed the importance of hydration.  Notably, she is asymptomatic and does not complain of dizziness, syncopal episodes. We discussed the importance of treating her blood pressure as she is not a goal of 130/80. At this time we jointly agreed to increase in the enlapril 10 mg twice a day from 5 mg BID which she is currently on. Repeat BMP next week. We also discussed an alternative treatment and using metoprolol to control blood pressure as opposed to increase in enalapril due to history of palpitations/anxiety. Patient is pending trip this week like to follow up with our office after since she is interested in the metoprolol and will discuss further with her pcp.       Relevant Orders   Basic metabolic panel     Other   Palpitations    Chronic, daily. Reviewed prior workup with Dr. Ubaldo Glassing. improve with reduced caffeine dose. They seem to increase with anxiety. No palpitations while in exam room. Advised f/u with pcp if increase or new symptoms develop.             I have discontinued Ms. Langenfeld's Calcium Carb-Cholecalciferol (CALCIUM + D3 PO). I am also having her maintain her beta carotene w/minerals,  valACYclovir, triamterene-hydrochlorothiazide, FLUoxetine, enalapril, ALPRAZolam, aspirin EC, and milk thistle.   Meds ordered this encounter  Medications  . milk thistle 175 MG tablet    Sig: Take 175 mg by mouth daily.    Return precautions given.   Risks, benefits, and alternatives of the medications and treatment plan prescribed today were discussed, and patient expressed understanding.   Education regarding symptom management and diagnosis given to patient on AVS.  Continue to follow with Einar Pheasant, MD for routine health maintenance.   Jordan Benson and I agreed with plan.   Mable Paris, FNP

## 2016-08-27 NOTE — Patient Instructions (Signed)
Labs next Monday  We discussed Metoprolol as an alternative to increasing enalapril. Since traveling, we will not do this at this time. This medication is a beta blocker and it would help with blood pressure as well as palpitations. We also use this medication off label for anxiety. As discussed blood pressure goal is 130/80. Be mindful as you go into the fall and start exercising again, blood pressure may come down and you may not need to stay on increased dose. As for now, please start taking enalapril 10 mg in the morning and 10 mg in the evening. Any new symptoms or uncontrolled blood pressure, please call the office to make a follow-up appointment with myself or Dr. Nicki Jordan Benson meeting you   Hypertension Hypertension, commonly called high blood pressure, is when the force of blood pumping through the arteries is too strong. The arteries are the blood vessels that carry blood from the heart throughout the body. Hypertension forces the heart to work harder to pump blood and may cause arteries to become narrow or stiff. Having untreated or uncontrolled hypertension can cause heart attacks, strokes, kidney disease, and other problems. A blood pressure reading consists of a higher number over a lower number. Ideally, your blood pressure should be below 120/80. The first ("top") number is called the systolic pressure. It is a measure of the pressure in your arteries as your heart beats. The second ("bottom") number is called the diastolic pressure. It is a measure of the pressure in your arteries as the heart relaxes. What are the causes? The cause of this condition is not known. What increases the risk? Some risk factors for high blood pressure are under your control. Others are not. Factors you can change  Smoking.  Having type 2 diabetes mellitus, high cholesterol, or both.  Not getting enough exercise or physical activity.  Being overweight.  Having too much fat, sugar, calories, or  salt (sodium) in your diet.  Drinking too much alcohol. Factors that are difficult or impossible to change  Having chronic kidney disease.  Having a family history of high blood pressure.  Age. Risk increases with age.  Race. You may be at higher risk if you are African-American.  Gender. Men are at higher risk than women before age 51. After age 10, women are at higher risk than men.  Having obstructive sleep apnea.  Stress. What are the signs or symptoms? Extremely high blood pressure (hypertensive crisis) may cause:  Headache.  Anxiety.  Shortness of breath.  Nosebleed.  Nausea and vomiting.  Severe chest pain.  Jerky movements you cannot control (seizures).  How is this diagnosed? This condition is diagnosed by measuring your blood pressure while you are seated, with your arm resting on a surface. The cuff of the blood pressure monitor will be placed directly against the skin of your upper arm at the level of your heart. It should be measured at least twice using the same arm. Certain conditions can cause a difference in blood pressure between your right and left arms. Certain factors can cause blood pressure readings to be lower or higher than normal (elevated) for a short period of time:  When your blood pressure is higher when you are in a health care provider's office than when you are at home, this is called white coat hypertension. Most people with this condition do not need medicines.  When your blood pressure is higher at home than when you are in a health care provider's office, this  is called masked hypertension. Most people with this condition may need medicines to control blood pressure.  If you have a high blood pressure reading during one visit or you have normal blood pressure with other risk factors:  You may be asked to return on a different day to have your blood pressure checked again.  You may be asked to monitor your blood pressure at home for 1  week or longer.  If you are diagnosed with hypertension, you may have other blood or imaging tests to help your health care provider understand your overall risk for other conditions. How is this treated? This condition is treated by making healthy lifestyle changes, such as eating healthy foods, exercising more, and reducing your alcohol intake. Your health care provider may prescribe medicine if lifestyle changes are not enough to get your blood pressure under control, and if:  Your systolic blood pressure is above 130.  Your diastolic blood pressure is above 80.  Your personal target blood pressure may vary depending on your medical conditions, your age, and other factors. Follow these instructions at home: Eating and drinking  Eat a diet that is high in fiber and potassium, and low in sodium, added sugar, and fat. An example eating plan is called the DASH (Dietary Approaches to Stop Hypertension) diet. To eat this way: ? Eat plenty of fresh fruits and vegetables. Try to fill half of your plate at each meal with fruits and vegetables. ? Eat whole grains, such as whole wheat pasta, brown rice, or whole grain bread. Fill about one quarter of your plate with whole grains. ? Eat or drink low-fat dairy products, such as skim milk or low-fat yogurt. ? Avoid fatty cuts of meat, processed or cured meats, and poultry with skin. Fill about one quarter of your plate with lean proteins, such as fish, chicken without skin, beans, eggs, and tofu. ? Avoid premade and processed foods. These tend to be higher in sodium, added sugar, and fat.  Reduce your daily sodium intake. Most people with hypertension should eat less than 1,500 mg of sodium a day.  Limit alcohol intake to no more than 1 drink a day for nonpregnant women and 2 drinks a day for men. One drink equals 12 oz of beer, 5 oz of wine, or 1 oz of hard liquor. Lifestyle  Work with your health care provider to maintain a healthy body weight or  to lose weight. Ask what an ideal weight is for you.  Get at least 30 minutes of exercise that causes your heart to beat faster (aerobic exercise) most days of the week. Activities may include walking, swimming, or biking.  Include exercise to strengthen your muscles (resistance exercise), such as pilates or lifting weights, as part of your weekly exercise routine. Try to do these types of exercises for 30 minutes at least 3 days a week.  Do not use any products that contain nicotine or tobacco, such as cigarettes and e-cigarettes. If you need help quitting, ask your health care provider.  Monitor your blood pressure at home as told by your health care provider.  Keep all follow-up visits as told by your health care provider. This is important. Medicines  Take over-the-counter and prescription medicines only as told by your health care provider. Follow directions carefully. Blood pressure medicines must be taken as prescribed.  Do not skip doses of blood pressure medicine. Doing this puts you at risk for problems and can make the medicine less effective.  Ask  your health care provider about side effects or reactions to medicines that you should watch for. Contact a health care provider if:  You think you are having a reaction to a medicine you are taking.  You have headaches that keep coming back (recurring).  You feel dizzy.  You have swelling in your ankles.  You have trouble with your vision. Get help right away if:  You develop a severe headache or confusion.  You have unusual weakness or numbness.  You feel faint.  You have severe pain in your chest or abdomen.  You vomit repeatedly.  You have trouble breathing. Summary  Hypertension is when the force of blood pumping through your arteries is too strong. If this condition is not controlled, it may put you at risk for serious complications.  Your personal target blood pressure may vary depending on your medical  conditions, your age, and other factors. For most people, a normal blood pressure is less than 120/80.  Hypertension is treated with lifestyle changes, medicines, or a combination of both. Lifestyle changes include weight loss, eating a healthy, low-sodium diet, exercising more, and limiting alcohol. This information is not intended to replace advice given to you by your health care provider. Make sure you discuss any questions you have with your health care provider. Document Released: 01/26/2005 Document Revised: 12/25/2015 Document Reviewed: 12/25/2015 Elsevier Interactive Patient Education  [employer].

## 2016-08-29 ENCOUNTER — Other Ambulatory Visit: Payer: Self-pay | Admitting: Internal Medicine

## 2016-08-31 ENCOUNTER — Other Ambulatory Visit: Payer: BC Managed Care – PPO

## 2016-09-01 ENCOUNTER — Other Ambulatory Visit: Payer: BC Managed Care – PPO

## 2016-09-17 ENCOUNTER — Other Ambulatory Visit (INDEPENDENT_AMBULATORY_CARE_PROVIDER_SITE_OTHER): Payer: BC Managed Care – PPO

## 2016-09-17 DIAGNOSIS — I10 Essential (primary) hypertension: Secondary | ICD-10-CM

## 2016-09-17 LAB — BASIC METABOLIC PANEL
BUN: 19 mg/dL (ref 6–23)
CALCIUM: 9.5 mg/dL (ref 8.4–10.5)
CO2: 30 meq/L (ref 19–32)
CREATININE: 0.83 mg/dL (ref 0.40–1.20)
Chloride: 99 mEq/L (ref 96–112)
GFR: 73.87 mL/min (ref >60.00)
GLUCOSE: 110 mg/dL — AB (ref 70–99)
Potassium: 4 mEq/L (ref 3.5–5.1)
SODIUM: 135 meq/L (ref 135–145)

## 2016-09-21 ENCOUNTER — Encounter: Payer: Self-pay | Admitting: Family

## 2016-09-21 NOTE — Telephone Encounter (Signed)
Pease advise what dosing patient should be taking, once or twice a day?

## 2016-09-21 NOTE — Telephone Encounter (Signed)
Attempted to reach via phone, left a VM, sent Mychart message, thanks

## 2016-09-21 NOTE — Telephone Encounter (Signed)
How are her blood pressures doing?  How is she taking now?  If her blood pressures look good and she is currently taking bid then I would recommend staying on two per day.

## 2016-09-22 MED ORDER — ENALAPRIL MALEATE 5 MG PO TABS: 10.0000 mg | ORAL_TABLET | Freq: Two times a day (BID) | ORAL | 0 refills | Status: DC

## 2016-09-24 ENCOUNTER — Other Ambulatory Visit: Payer: Self-pay | Admitting: Internal Medicine

## 2016-09-24 MED ORDER — ENALAPRIL MALEATE 10 MG PO TABS: 10.0000 mg | ORAL_TABLET | Freq: Two times a day (BID) | ORAL | 2 refills | Status: DC

## 2016-09-24 NOTE — Progress Notes (Signed)
rx ok'd for vasotec 10mg  bid  #60 with 2 refills.

## 2016-09-24 NOTE — Telephone Encounter (Signed)
Last OV: 05/15/2016 Next OV: not scheduled Last refilled: 06/18/16

## 2016-09-25 MED ORDER — ALPRAZOLAM 0.25 MG PO TABS: 0.2500 mg | ORAL_TABLET | Freq: Every day | ORAL | 0 refills | Status: DC | PRN

## 2016-09-25 NOTE — Telephone Encounter (Signed)
ok'd rx for alprazolam #30 with no refills.   

## 2016-09-25 NOTE — Telephone Encounter (Signed)
Faxed

## 2016-10-02 NOTE — Telephone Encounter (Signed)
Please call pt and ler her know that she will need to be seen if still having issues with her blood pressure.  I can see her 10/06/16 at 10:30.  Can schedule for this appt, but please confirm she is not having any acute issues.  If acute issues or concerns, she needs to be seen earlier.  Thanks

## 2016-10-02 NOTE — Telephone Encounter (Signed)
Also, please let Caryl Pina know if pt is coming in for appt (or not).

## 2016-10-06 ENCOUNTER — Ambulatory Visit (INDEPENDENT_AMBULATORY_CARE_PROVIDER_SITE_OTHER): Payer: BC Managed Care – PPO | Admitting: Internal Medicine

## 2016-10-06 ENCOUNTER — Encounter: Payer: Self-pay | Admitting: Internal Medicine

## 2016-10-06 VITALS — BP 140/82 | HR 87 | Temp 98.3°F | Ht 68.0 in | Wt 204.2 lb

## 2016-10-06 DIAGNOSIS — R002 Palpitations: Secondary | ICD-10-CM

## 2016-10-06 DIAGNOSIS — R7989 Other specified abnormal findings of blood chemistry: Secondary | ICD-10-CM

## 2016-10-06 DIAGNOSIS — I10 Essential (primary) hypertension: Secondary | ICD-10-CM

## 2016-10-06 DIAGNOSIS — R739 Hyperglycemia, unspecified: Secondary | ICD-10-CM | POA: Diagnosis not present

## 2016-10-06 DIAGNOSIS — Z23 Encounter for immunization: Secondary | ICD-10-CM | POA: Diagnosis not present

## 2016-10-06 DIAGNOSIS — C439 Malignant melanoma of skin, unspecified: Secondary | ICD-10-CM | POA: Diagnosis not present

## 2016-10-06 DIAGNOSIS — E7800 Pure hypercholesterolemia, unspecified: Secondary | ICD-10-CM

## 2016-10-06 DIAGNOSIS — F419 Anxiety disorder, unspecified: Secondary | ICD-10-CM | POA: Diagnosis not present

## 2016-10-06 DIAGNOSIS — R945 Abnormal results of liver function studies: Secondary | ICD-10-CM

## 2016-10-06 MED ORDER — AMLODIPINE BESYLATE 5 MG PO TABS: 5.0000 mg | ORAL_TABLET | Freq: Every day | ORAL | 1 refills | Status: DC

## 2016-10-06 NOTE — Progress Notes (Signed)
Pre-visit discussion using our clinic review tool. No additional management support is needed unless otherwise documented below in the visit note.  

## 2016-10-06 NOTE — Progress Notes (Signed)
Patient ID: Jordan Benson, female   DOB: 13-Nov-1955, 61 y.o.   MRN: [id number]   Subjective:    Patient ID: Jordan Benson, female    DOB: December 21, 1955, 61 y.o.   MRN: [id number]  HPI  Patient here for a scheduled follow up.  Increased stress with her husband's health issues.  Discussed with her today.  Seeing a Social worker.  Discussed referral to psychiatry.  She plans to discuss with her counselor - to get this arrange.   Has support.  Tries to stay active.  No chest pain.  No sob.  No acid reflux.  No abdominal pain.  Bowels moving.  Blood pressure remaining elevated.  Outside blood pressures averaging 737-106 systolic readings.  No headache.     Past Medical History:  Diagnosis Date  . Anxiety   . GERD (gastroesophageal reflux disease)   . Hypertension   . IBS (irritable bowel syndrome)   . Melanoma ([place])    followed by Dr Evorn Gong  . Sleep apnea   . Vitamin D deficiency    Past Surgical History:  Procedure Laterality Date  . ABDOMINAL HYSTERECTOMY  1992  . MOHS SURGERY  2012  . OVARIAN CYST REMOVAL  1991  . SEPTOPLASTY  2009  . TUBAL LIGATION  1985  . tummy tuck  1995  . WISDOM TOOTH EXTRACTION     Family History  Problem Relation Age of Onset  . Cancer Mother 38       breast cancer  . Heart disease Mother   . Hypertension Mother   . Diabetes Mother   . Breast cancer Mother 62  . Heart disease Father    Social History   Social History  . Marital status: Divorced    Spouse name: N/A  . Number of children: 2  . Years of education: N/A   Occupational History  .  [employer]   Social History Main Topics  . Smoking status: Former Research scientist (life sciences)  . Smokeless tobacco: Never Used  . Alcohol use 0.0 oz/week  . Drug use: No  . Sexual activity: Not Asked   Other Topics Concern  . None   Social History Narrative  . None    Outpatient Encounter Prescriptions as of 10/06/2016  Medication Sig  . ALPRAZolam (XANAX) 0.25 MG tablet Take 1 tablet (0.25 mg total) by mouth daily  as needed. for anxiety  . aspirin EC 81 MG tablet Take 81 mg by mouth daily.  . beta carotene w/minerals (OCUVITE) tablet Take 1 tablet by mouth daily.  . enalapril (VASOTEC) 10 MG tablet Take 1 tablet (10 mg total) by mouth 2 (two) times daily.  Marland Kitchen FLUoxetine (PROZAC) 40 MG capsule Take 1 capsule (40 mg total) by mouth daily.  . milk thistle 175 MG tablet Take 175 mg by mouth daily.  Marland Kitchen triamterene-hydrochlorothiazide (MAXZIDE-25) 37.5-25 MG tablet TAKE 1 TABLET BY MOUTH DAILY.  . valACYclovir (VALTREX) 1000 MG tablet Take 1 tablet (1,000 mg total) by mouth as needed.  Marland Kitchen amLODipine (NORVASC) 5 MG tablet Take 1 tablet (5 mg total) by mouth daily.   No facility-administered encounter medications on file as of 10/06/2016.     Review of Systems  Constitutional: Negative for appetite change and unexpected weight change.  HENT: Negative for congestion and sinus pressure.   Respiratory: Negative for cough, chest tightness and shortness of breath.   Cardiovascular: Negative for chest pain, palpitations and leg swelling.  Gastrointestinal: Negative for abdominal pain, diarrhea, nausea and vomiting.  Genitourinary: Negative for  difficulty urinating and dysuria.  Musculoskeletal: Negative for back pain and joint swelling.  Skin: Negative for color change and rash.  Neurological: Negative for headaches.       No significant dizziness.    Psychiatric/Behavioral: Negative for agitation and dysphoric mood.       Objective:     Blood pressure rechecked by me:  136-138/82  Physical Exam  Constitutional: She appears well-developed and well-nourished. No distress.  HENT:  Nose: Nose normal.  Mouth/Throat: Oropharynx is clear and moist.  Neck: Neck supple. No thyromegaly present.  Cardiovascular: Normal rate and regular rhythm.   Pulmonary/Chest: Breath sounds normal. No respiratory distress. She has no wheezes.  Abdominal: Soft. Bowel sounds are normal. There is no tenderness.  Musculoskeletal:  She exhibits no edema or tenderness.  Lymphadenopathy:    She has no cervical adenopathy.  Skin: No rash noted. No erythema.  Psychiatric: She has a normal mood and affect. Her behavior is normal.    BP 140/82 (BP Location: Right Arm, Cuff Size: Normal)   Pulse 87   Temp 98.3 F (36.8 C) (Oral)   Ht 5\' 8"  (1.727 m)   Wt 204 lb 3.2 oz (92.6 kg)   SpO2 97%   BMI 31.05 kg/m  Wt Readings from Last 3 Encounters:  10/06/16 204 lb 3.2 oz (92.6 kg)  08/27/16 202 lb 3.2 oz (91.7 kg)  07/01/16 202 lb (91.6 kg)     Lab Results  Component Value Date   WBC 5.3 05/15/2016   HGB 14.5 05/15/2016   HCT 42.1 05/15/2016   PLT 281.0 05/15/2016   GLUCOSE 110 (H) 09/17/2016   CHOL 247 (H) 05/15/2016   TRIG 162.0 (H) 05/15/2016   HDL 59.00 05/15/2016   LDLCALC 155 (H) 05/15/2016   ALT 40 (H) 05/15/2016   AST 25 05/15/2016   NA 135 09/17/2016   K 4.0 09/17/2016   CL 99 09/17/2016   CREATININE 0.83 09/17/2016   BUN 19 09/17/2016   CO2 30 09/17/2016   TSH 1.78 05/15/2016   HGBA1C 5.4 05/15/2016    Mm Screening Breast Tomo Bilateral  Result Date: 12/03/2015 CLINICAL DATA:  Screening. EXAM: 2D DIGITAL SCREENING BILATERAL MAMMOGRAM WITH CAD AND ADJUNCT TOMO COMPARISON:  Previous exam(s). ACR Breast Density Category b: There are scattered areas of fibroglandular density. FINDINGS: There are no findings suspicious for malignancy. Images were processed with CAD. IMPRESSION: No mammographic evidence of malignancy. A result letter of this screening mammogram will be mailed directly to the patient. RECOMMENDATION: Screening mammogram in one year. (Code:SM-B-01Y) BI-RADS CATEGORY  1: Negative. Electronically Signed   By: Franki Cabot M.D.   On: 12/03/2015 14:48       Assessment & Plan:   Problem List Items Addressed This Visit    Abnormal liver function test    Discussed diet and exercise.  Discussed continued decrease alcohol intake.  Follow liver panel.        Relevant Orders   Hepatic  function panel   Anxiety    On prozac.  Discussed referral to psychiatry.  She is seeing a Social worker.  Plans to discuss with her regarding referral to psychiatry.        Essential hypertension, benign    Persistent elevated blood pressure on outside checks.  Improved on my check here.  On enalapril.  Add amlodipine 5mg  q day.  Follow pressures.  Follow metabolic panel.        Relevant Medications   amLODipine (NORVASC) 5 MG tablet  Other Relevant Orders   Basic metabolic panel   Hypercholesterolemia    Low cholesterol diet and exercise.  Follow lipid panel.       Relevant Medications   amLODipine (NORVASC) 5 MG tablet   Other Relevant Orders   Lipid panel   Melanoma ([place])    Followed by Dr Evorn Gong.       Palpitations    Has seen cardiology previously.  No increased heart rate or palpitations now.  Better.  Follow.         Other Visit Diagnoses    Hyperglycemia    -  Primary   Relevant Orders   Hemoglobin A1c   Need for immunization against influenza       Relevant Orders   Flu Vaccine QUAD 36+ mos IM (Completed)       Einar Pheasant, MD

## 2016-10-08 NOTE — Assessment & Plan Note (Signed)
Followed by Dr Dasher.   

## 2016-10-08 NOTE — Assessment & Plan Note (Signed)
On prozac.  Discussed referral to psychiatry.  She is seeing a Social worker.  Plans to discuss with her regarding referral to psychiatry.

## 2016-10-08 NOTE — Assessment & Plan Note (Signed)
Has seen cardiology previously.  No increased heart rate or palpitations now.  Better.  Follow.

## 2016-10-08 NOTE — Assessment & Plan Note (Signed)
Low cholesterol diet and exercise.  Follow lipid panel.   

## 2016-10-08 NOTE — Assessment & Plan Note (Signed)
Persistent elevated blood pressure on outside checks.  Improved on my check here.  On enalapril.  Add amlodipine 5mg  q day.  Follow pressures.  Follow metabolic panel.

## 2016-10-08 NOTE — Assessment & Plan Note (Signed)
Discussed diet and exercise.  Discussed continued decrease alcohol intake.  Follow liver panel.

## 2016-10-30 ENCOUNTER — Other Ambulatory Visit: Payer: Self-pay | Admitting: Internal Medicine

## 2016-11-03 ENCOUNTER — Encounter: Payer: Self-pay | Admitting: Internal Medicine

## 2016-11-03 ENCOUNTER — Telehealth: Payer: Self-pay

## 2016-11-03 MED ORDER — AMLODIPINE BESYLATE 5 MG PO TABS: 5.0000 mg | ORAL_TABLET | Freq: Every day | ORAL | 1 refills | Status: DC

## 2016-11-03 NOTE — Telephone Encounter (Signed)
Fax received for refill on Amlodipine 5mg . Pt needs follow up from 8/28 app. Then can refill to that app. Called and l/m to call office.

## 2016-11-04 NOTE — Telephone Encounter (Signed)
Left message to return call to our office.  

## 2016-11-10 NOTE — Telephone Encounter (Signed)
Refill was sent on 9/25 pt needs to make app. Sent my chart message sent to call office for app.

## 2016-11-11 NOTE — Telephone Encounter (Signed)
Lm on vm to schedule appt at lunch or end of 1/2 day.

## 2016-11-13 ENCOUNTER — Other Ambulatory Visit: Payer: Self-pay | Admitting: Internal Medicine

## 2016-11-13 DIAGNOSIS — Z1231 Encounter for screening mammogram for malignant neoplasm of breast: Secondary | ICD-10-CM

## 2016-11-26 ENCOUNTER — Encounter: Payer: Self-pay | Admitting: Internal Medicine

## 2016-11-26 NOTE — Telephone Encounter (Signed)
She sees psychiatry and they are the ones that changed her medication.  I would prefer her to call them about her concerns with the zoloft and they can adjust her medication.  Regarding the blood pressure, can schedule a f/u appt with me to reassess and see if blood pressure medication needs to be adjusted.  Can schedule appt.

## 2016-12-03 ENCOUNTER — Ambulatory Visit
Admission: RE | Admit: 2016-12-03 | Discharge: 2016-12-03 | Disposition: A | Discharge status: Home / Self Care | Payer: BC Managed Care – PPO | Source: Ambulatory Visit | Attending: Internal Medicine | Admitting: Internal Medicine

## 2016-12-03 DIAGNOSIS — Z1231 Encounter for screening mammogram for malignant neoplasm of breast: Secondary | ICD-10-CM | POA: Diagnosis present

## 2016-12-14 ENCOUNTER — Other Ambulatory Visit: Payer: Self-pay | Admitting: Internal Medicine

## 2016-12-15 NOTE — Telephone Encounter (Signed)
Last refill 09/25/16 #30 no rf Last o/v 10/06/80 F/u 01/06/2017

## 2017-01-06 ENCOUNTER — Ambulatory Visit: Payer: BC Managed Care – PPO | Admitting: Internal Medicine

## 2017-01-06 ENCOUNTER — Encounter: Payer: Self-pay | Admitting: Internal Medicine

## 2017-01-06 DIAGNOSIS — R945 Abnormal results of liver function studies: Secondary | ICD-10-CM

## 2017-01-06 DIAGNOSIS — I10 Essential (primary) hypertension: Secondary | ICD-10-CM | POA: Diagnosis not present

## 2017-01-06 DIAGNOSIS — E7800 Pure hypercholesterolemia, unspecified: Secondary | ICD-10-CM

## 2017-01-06 DIAGNOSIS — C439 Malignant melanoma of skin, unspecified: Secondary | ICD-10-CM | POA: Diagnosis not present

## 2017-01-06 DIAGNOSIS — R7989 Other specified abnormal findings of blood chemistry: Secondary | ICD-10-CM

## 2017-01-06 DIAGNOSIS — F419 Anxiety disorder, unspecified: Secondary | ICD-10-CM

## 2017-01-06 DIAGNOSIS — M25561 Pain in right knee: Secondary | ICD-10-CM | POA: Diagnosis not present

## 2017-01-06 NOTE — Progress Notes (Signed)
Patient ID: Jordan Benson, female   DOB: 09/30/1955, 61 y.o.   MRN: [id number]   Subjective:    Patient ID: Jordan Benson, female    DOB: 1956/01/07, 61 y.o.   MRN: [id number]  HPI  Patient here for a scheduled follow up.  She has been under increased stress with her husband's health issues.  Overall she feels she is handling things relatively well.  Off zoloft.  Has been completely off for two weeks.  Wants to remain off.  Has significantly decreased her alcohol intake.  Work is going well.  Tries to stay active.  No chest pain.  No sob.  No acid reflux.  No abdominal pain.  Bowels moving.     Past Medical History:  Diagnosis Date  . Anxiety   . GERD (gastroesophageal reflux disease)   . Hypertension   . IBS (irritable bowel syndrome)   . Melanoma ([place])    followed by Dr Evorn Gong  . Sleep apnea   . Vitamin D deficiency    Past Surgical History:  Procedure Laterality Date  . ABDOMINAL HYSTERECTOMY  1992  . MOHS SURGERY  2012  . OVARIAN CYST REMOVAL  1991  . SEPTOPLASTY  2009  . TUBAL LIGATION  1985  . tummy tuck  1995  . WISDOM TOOTH EXTRACTION     Family History  Problem Relation Age of Onset  . Cancer Mother 61       breast cancer  . Heart disease Mother   . Hypertension Mother   . Diabetes Mother   . Breast cancer Mother 36  . Heart disease Father    Social History   Socioeconomic History  . Marital status: Divorced    Spouse name: None  . Number of children: 2  . Years of education: None  . Highest education level: None  Social Needs  . Financial resource strain: None  . Food insecurity - worry: None  . Food insecurity - inability: None  . Transportation needs - medical: None  . Transportation needs - non-medical: None  Occupational History    Employer: [employer]  Tobacco Use  . Smoking status: Former Research scientist (life sciences)  . Smokeless tobacco: Never Used  Substance and Sexual Activity  . Alcohol use: Yes    Alcohol/week: 0.0 oz  . Drug use: No  . Sexual  activity: None  Other Topics Concern  . None  Social History Narrative  . None    Outpatient Encounter Medications as of 01/06/2017  Medication Sig  . ALPRAZolam (XANAX) 0.25 MG tablet TAKE 1 TABLET BY MOUTH DAILY AS NEEDED FOR ANXIETY  . amLODipine (NORVASC) 5 MG tablet Take 1 tablet (5 mg total) by mouth daily.  Marland Kitchen aspirin EC 81 MG tablet Take 81 mg by mouth daily.  . beta carotene w/minerals (OCUVITE) tablet Take 1 tablet by mouth daily.  . enalapril (VASOTEC) 10 MG tablet Take 1 tablet (10 mg total) by mouth 2 (two) times daily.  Marland Kitchen FLUoxetine (PROZAC) 40 MG capsule TAKE 1 CAPSULE DAILY  . milk thistle 175 MG tablet Take 175 mg by mouth daily.  Marland Kitchen triamterene-hydrochlorothiazide (MAXZIDE-25) 37.5-25 MG tablet TAKE 1 TABLET BY MOUTH DAILY.  . valACYclovir (VALTREX) 1000 MG tablet Take 1 tablet (1,000 mg total) by mouth as needed.   No facility-administered encounter medications on file as of 01/06/2017.     Review of Systems  Constitutional: Negative for appetite change and fever.  HENT: Negative for congestion and sinus pressure.   Respiratory: Negative for  cough, chest tightness and shortness of breath.   Cardiovascular: Negative for chest pain, palpitations and leg swelling.  Gastrointestinal: Negative for abdominal pain, diarrhea and nausea.  Genitourinary: Negative for difficulty urinating and dysuria.  Musculoskeletal: Negative for back pain and myalgias.  Skin: Negative for color change and rash.  Neurological: Negative for dizziness, light-headedness and headaches.  Psychiatric/Behavioral: Negative for agitation and dysphoric mood.       Objective:    Physical Exam  Constitutional: She appears well-developed and well-nourished. No distress.  HENT:  Nose: Nose normal.  Mouth/Throat: Oropharynx is clear and moist.  Neck: Neck supple. No thyromegaly present.  Cardiovascular: Normal rate and regular rhythm.  Pulmonary/Chest: Breath sounds normal. No respiratory  distress. She has no wheezes.  Abdominal: Soft. Bowel sounds are normal. There is no tenderness.  Musculoskeletal: She exhibits no edema or tenderness.  Lymphadenopathy:    She has no cervical adenopathy.  Skin: No rash noted. No erythema.  Psychiatric: She has a normal mood and affect. Her behavior is normal.    BP 136/78   Pulse 87   Temp 97.9 F (36.6 C) (Oral)   Ht 5\' 8"  (1.727 m)   Wt 209 lb 6.4 oz (95 kg)   SpO2 98%   BMI 31.84 kg/m  Wt Readings from Last 3 Encounters:  01/06/17 209 lb 6.4 oz (95 kg)  10/06/16 204 lb 3.2 oz (92.6 kg)  08/27/16 202 lb 3.2 oz (91.7 kg)     Lab Results  Component Value Date   WBC 5.3 05/15/2016   HGB 14.5 05/15/2016   HCT 42.1 05/15/2016   PLT 281.0 05/15/2016   GLUCOSE 110 (H) 09/17/2016   CHOL 247 (H) 05/15/2016   TRIG 162.0 (H) 05/15/2016   HDL 59.00 05/15/2016   LDLCALC 155 (H) 05/15/2016   ALT 40 (H) 05/15/2016   AST 25 05/15/2016   NA 135 09/17/2016   K 4.0 09/17/2016   CL 99 09/17/2016   CREATININE 0.83 09/17/2016   BUN 19 09/17/2016   CO2 30 09/17/2016   TSH 1.78 05/15/2016   HGBA1C 5.4 05/15/2016    Mm Screening Breast Tomo Bilateral  Result Date: 12/03/2016 CLINICAL DATA:  Screening. EXAM: 2D DIGITAL SCREENING BILATERAL MAMMOGRAM WITH CAD AND ADJUNCT TOMO COMPARISON:  Previous exam(s). ACR Breast Density Category b: There are scattered areas of fibroglandular density. FINDINGS: There are no findings suspicious for malignancy. Images were processed with CAD. IMPRESSION: No mammographic evidence of malignancy. A result letter of this screening mammogram will be mailed directly to the patient. RECOMMENDATION: Screening mammogram in one year. (Code:SM-B-01Y) BI-RADS CATEGORY  1: Negative. Electronically Signed   By: Marin Olp M.D.   On: 12/03/2016 16:14       Assessment & Plan:   Problem List Items Addressed This Visit    Abnormal liver function test    She has significantly decreased her alcohol intake.   Discussed diet and exercise.  Follow liver panel.        Anxiety    Increased stress.  Off zoloft.  Feels she is doing relatively well.  Desires no further medication.  Follow.        Essential hypertension, benign    Blood pressure doing ok on current regimen.  Follow pressures.  Follow metabolic panel.        Hypercholesterolemia    Low cholesterol diet and exercise.  Discussed diet and exercise.  Follow lipid panel.        Melanoma ([place])  Followed by Dr Evorn Gong.       Right knee pain    Plans to f/u with ortho if persistent problems.            Einar Pheasant, MD

## 2017-01-09 ENCOUNTER — Encounter: Payer: Self-pay | Admitting: Internal Medicine

## 2017-01-09 NOTE — Assessment & Plan Note (Signed)
Low cholesterol diet and exercise.  Discussed diet and exercise.  Follow lipid panel.

## 2017-01-09 NOTE — Assessment & Plan Note (Signed)
She has significantly decreased her alcohol intake.  Discussed diet and exercise.  Follow liver panel.

## 2017-01-09 NOTE — Assessment & Plan Note (Signed)
Plans to f/u with ortho if persistent problems.

## 2017-01-09 NOTE — Assessment & Plan Note (Signed)
Increased stress.  Off zoloft.  Feels she is doing relatively well.  Desires no further medication.  Follow.

## 2017-01-09 NOTE — Assessment & Plan Note (Signed)
Blood pressure doing ok on current regimen.  Follow pressures.  Follow metabolic panel.

## 2017-01-09 NOTE — Assessment & Plan Note (Signed)
Followed by Dr Dasher.   

## 2017-01-12 ENCOUNTER — Other Ambulatory Visit: Payer: Self-pay | Admitting: *Deleted

## 2017-01-12 MED ORDER — TRIAMTERENE-HCTZ 37.5-25 MG PO TABS: 1.0000 | ORAL_TABLET | Freq: Every day | ORAL | 0 refills | Status: DC

## 2017-01-13 ENCOUNTER — Other Ambulatory Visit: Payer: Self-pay | Admitting: *Deleted

## 2017-01-13 MED ORDER — AMLODIPINE BESYLATE 5 MG PO TABS: 5.0000 mg | ORAL_TABLET | Freq: Every day | ORAL | 1 refills | Status: DC

## 2017-01-13 MED ORDER — TRIAMTERENE-HCTZ 37.5-25 MG PO TABS: 1.0000 | ORAL_TABLET | Freq: Every day | ORAL | 0 refills | Status: DC

## 2017-01-13 MED ORDER — ENALAPRIL MALEATE 10 MG PO TABS: 10.0000 mg | ORAL_TABLET | Freq: Two times a day (BID) | ORAL | 2 refills | Status: DC

## 2017-01-14 ENCOUNTER — Other Ambulatory Visit: Payer: Self-pay | Admitting: *Deleted

## 2017-01-14 MED ORDER — ENALAPRIL MALEATE 10 MG PO TABS: 10.0000 mg | ORAL_TABLET | Freq: Two times a day (BID) | ORAL | 2 refills | Status: DC

## 2017-01-22 ENCOUNTER — Encounter: Payer: Self-pay | Admitting: Internal Medicine

## 2017-01-25 ENCOUNTER — Telehealth: Payer: Self-pay | Admitting: Internal Medicine

## 2017-01-25 ENCOUNTER — Other Ambulatory Visit: Payer: Self-pay | Admitting: *Deleted

## 2017-01-25 MED ORDER — AMLODIPINE BESYLATE 5 MG PO TABS: 5.0000 mg | ORAL_TABLET | Freq: Every day | ORAL | 0 refills | Status: DC

## 2017-01-25 MED ORDER — TRIAMTERENE-HCTZ 37.5-25 MG PO TABS: 1.0000 | ORAL_TABLET | Freq: Every day | ORAL | 0 refills | Status: DC

## 2017-01-25 NOTE — Telephone Encounter (Signed)
Copied from [place] [id number]. Topic: Quick Communication - Rx Refill/Question >> Jan 25, 2017  9:06 AM Malena Catholic I, NT wrote: Has the patient contacted their pharmacy  no   (Agent: If no, request that the patient contact the pharmacy for the refill Maxzide 37.5 Mg and Norvasc 5 Mg    Preferred Pharmacy (with phone number or street name [facility] [address] [phone]    Agent: Please be advised that RX refills may take up to 3 business days. We ask that you follow-up with your pharmacy.

## 2017-01-25 NOTE — Telephone Encounter (Signed)
Pt returned call regarding medication refill and transfer request; informed pt that refill request has been sent to [facility]; pt verbalizes undertanding.

## 2017-01-25 NOTE — Telephone Encounter (Signed)
Attempted to contact pt regarding medication refill request and transfer of medication; left message on voice mail [phone]; see CRM # [phone]

## 2017-01-26 ENCOUNTER — Other Ambulatory Visit: Payer: BC Managed Care – PPO

## 2017-01-27 MED ORDER — FLUOXETINE HCL 40 MG PO CAPS: 40.0000 mg | ORAL_CAPSULE | Freq: Every day | ORAL | 1 refills | Status: DC

## 2017-01-27 MED ORDER — ENALAPRIL MALEATE 10 MG PO TABS: 10.0000 mg | ORAL_TABLET | Freq: Two times a day (BID) | ORAL | 2 refills | Status: DC

## 2017-02-09 DIAGNOSIS — C439 Malignant melanoma of skin, unspecified: Secondary | ICD-10-CM

## 2017-02-09 HISTORY — DX: Malignant melanoma of skin, unspecified: C43.9

## 2017-02-10 ENCOUNTER — Encounter: Payer: Self-pay | Admitting: Internal Medicine

## 2017-02-25 ENCOUNTER — Encounter: Payer: Self-pay | Admitting: Internal Medicine

## 2017-02-25 NOTE — Telephone Encounter (Signed)
Do you mind calling this pt.  She needs to be seen for pre op clearance.  Will need to work her in.   Can send to [facility] for work in appt.

## 2017-02-26 ENCOUNTER — Other Ambulatory Visit: Payer: Self-pay

## 2017-03-02 ENCOUNTER — Encounter: Payer: Self-pay | Admitting: Internal Medicine

## 2017-03-02 ENCOUNTER — Telehealth: Payer: Self-pay

## 2017-03-02 NOTE — Telephone Encounter (Signed)
Patient scheduled for surgical clearance.

## 2017-03-02 NOTE — Telephone Encounter (Signed)
Patient needs to schedule to be seen before form can be completed for surgery P[facility] may advise patient.

## 2017-03-02 NOTE — Telephone Encounter (Signed)
Copied from [place]. Topic: Inquiry >> Mar 02, 2017  1:04 PM Ether Griffins B wrote: Reason for CRM: pt is checking on status of permission slip she has emailed to Dr. Nicki Reaper for her knee surgery. Her other doctor keeps calling wanting to get this scheduled also she has requested a script for routine blood work. All of this was done on 02/25/17

## 2017-03-02 NOTE — Telephone Encounter (Signed)
Pt scheduled  

## 2017-03-03 ENCOUNTER — Ambulatory Visit: Payer: BC Managed Care – PPO | Admitting: Internal Medicine

## 2017-03-03 ENCOUNTER — Encounter: Payer: Self-pay | Admitting: Internal Medicine

## 2017-03-03 VITALS — BP 128/80 | HR 76 | Temp 98.2°F | Ht 68.0 in | Wt 210.0 lb

## 2017-03-03 DIAGNOSIS — I10 Essential (primary) hypertension: Secondary | ICD-10-CM | POA: Diagnosis not present

## 2017-03-03 DIAGNOSIS — R739 Hyperglycemia, unspecified: Secondary | ICD-10-CM | POA: Diagnosis not present

## 2017-03-03 DIAGNOSIS — R7989 Other specified abnormal findings of blood chemistry: Secondary | ICD-10-CM

## 2017-03-03 DIAGNOSIS — Z01818 Encounter for other preprocedural examination: Secondary | ICD-10-CM | POA: Diagnosis not present

## 2017-03-03 DIAGNOSIS — M25561 Pain in right knee: Secondary | ICD-10-CM | POA: Diagnosis not present

## 2017-03-03 DIAGNOSIS — F419 Anxiety disorder, unspecified: Secondary | ICD-10-CM

## 2017-03-03 DIAGNOSIS — R945 Abnormal results of liver function studies: Secondary | ICD-10-CM | POA: Diagnosis not present

## 2017-03-03 DIAGNOSIS — E7800 Pure hypercholesterolemia, unspecified: Secondary | ICD-10-CM | POA: Diagnosis not present

## 2017-03-03 LAB — LIPID PANEL
CHOLESTEROL: 190 mg/dL (ref 0–200)
HDL: 56.7 mg/dL (ref >39.00)
LDL CALC: 103 mg/dL — AB (ref 0–99)
NonHDL: 133.28
TRIGLYCERIDES: 150 mg/dL — AB (ref 0.0–149.0)
Total CHOL/HDL Ratio: 3
VLDL: 30 mg/dL (ref 0.0–40.0)

## 2017-03-03 LAB — HEPATIC FUNCTION PANEL
ALBUMIN: 4.1 g/dL (ref 3.5–5.2)
ALT: 29 U/L (ref 0–35)
AST: 18 U/L (ref 0–37)
Alkaline Phosphatase: 56 U/L (ref 39–117)
BILIRUBIN TOTAL: 1.4 mg/dL — AB (ref 0.2–1.2)
Bilirubin, Direct: 0.2 mg/dL (ref 0.0–0.3)
Total Protein: 7.1 g/dL (ref 6.0–8.3)

## 2017-03-03 LAB — BASIC METABOLIC PANEL
BUN: 15 mg/dL (ref 6–23)
CALCIUM: 9.2 mg/dL (ref 8.4–10.5)
CO2: 25 mEq/L (ref 19–32)
CREATININE: 0.75 mg/dL (ref 0.40–1.20)
Chloride: 101 mEq/L (ref 96–112)
GFR: 82.91 mL/min (ref >60.00)
Glucose, Bld: 99 mg/dL (ref 70–99)
Potassium: 3.9 mEq/L (ref 3.5–5.1)
Sodium: 139 mEq/L (ref 135–145)

## 2017-03-03 LAB — HEMOGLOBIN A1C: HEMOGLOBIN A1C: 5.6 % (ref 4.6–6.5)

## 2017-03-03 MED ORDER — TRIAMTERENE-HCTZ 37.5-25 MG PO TABS: 1.0000 | ORAL_TABLET | Freq: Every day | ORAL | 1 refills | Status: DC

## 2017-03-03 MED ORDER — AMLODIPINE BESYLATE 5 MG PO TABS: 5.0000 mg | ORAL_TABLET | Freq: Every day | ORAL | 1 refills | Status: DC

## 2017-03-03 MED ORDER — FLUOXETINE HCL 40 MG PO CAPS: 40.0000 mg | ORAL_CAPSULE | Freq: Every day | ORAL | 1 refills | Status: DC

## 2017-03-03 MED ORDER — ENALAPRIL MALEATE 10 MG PO TABS: 10.0000 mg | ORAL_TABLET | Freq: Two times a day (BID) | ORAL | 2 refills | Status: DC

## 2017-03-03 NOTE — Progress Notes (Signed)
Patient ID: Jordan Benson, female   DOB: 1955/03/20, 62 y.o.   MRN: [id number]   Subjective:    Patient ID: Jordan Benson, female    DOB: 07/26/1955, 62 y.o.   MRN: [id number]  HPI  Patient here for a pre op evaluation.  Planning for arthroscopic right knee surgery.  Having increased knee pain.  Limiting her exercise/walking.  No chest pain.  No sob.  No acid reflux.  No abdominal pain.  Bowels moving. No diarrhea.  Doing better regarding her stress.  Gained weight with zoloft.  Back on prozac now and feels good.  States her blood pressures on outside checks averaging 130/80.    Past Medical History:  Diagnosis Date  . Anxiety   . GERD (gastroesophageal reflux disease)   . Hypertension   . IBS (irritable bowel syndrome)   . Melanoma ([place])    followed by Dr Evorn Gong  . Sleep apnea   . Vitamin D deficiency    Past Surgical History:  Procedure Laterality Date  . ABDOMINAL HYSTERECTOMY  1992  . MOHS SURGERY  2012  . OVARIAN CYST REMOVAL  1991  . SEPTOPLASTY  2009  . TUBAL LIGATION  1985  . tummy tuck  1995  . WISDOM TOOTH EXTRACTION     Family History  Problem Relation Age of Onset  . Cancer Mother 38       breast cancer  . Heart disease Mother   . Hypertension Mother   . Diabetes Mother   . Breast cancer Mother 37  . Heart disease Father    Social History   Socioeconomic History  . Marital status: Divorced    Spouse name: None  . Number of children: 2  . Years of education: None  . Highest education level: None  Social Needs  . Financial resource strain: None  . Food insecurity - worry: None  . Food insecurity - inability: None  . Transportation needs - medical: None  . Transportation needs - non-medical: None  Occupational History    Employer: [employer]  Tobacco Use  . Smoking status: Former Research scientist (life sciences)  . Smokeless tobacco: Never Used  Substance and Sexual Activity  . Alcohol use: Yes    Alcohol/week: 0.0 oz  . Drug use: No  . Sexual activity: None  Other  Topics Concern  . None  Social History Narrative  . None    Outpatient Encounter Medications as of 03/03/2017  Medication Sig  . ALPRAZolam (XANAX) 0.25 MG tablet TAKE 1 TABLET BY MOUTH DAILY AS NEEDED FOR ANXIETY  . amLODipine (NORVASC) 5 MG tablet Take 1 tablet (5 mg total) by mouth daily.  Marland Kitchen aspirin EC 81 MG tablet Take 81 mg by mouth daily.  . beta carotene w/minerals (OCUVITE) tablet Take 1 tablet by mouth daily.  . enalapril (VASOTEC) 10 MG tablet Take 1 tablet (10 mg total) by mouth 2 (two) times daily.  Marland Kitchen FLUoxetine (PROZAC) 40 MG capsule Take 1 capsule (40 mg total) by mouth daily.  . milk thistle 175 MG tablet Take 175 mg by mouth daily.  Marland Kitchen triamterene-hydrochlorothiazide (MAXZIDE-25) 37.5-25 MG tablet Take 1 tablet by mouth daily.  . valACYclovir (VALTREX) 1000 MG tablet Take 1 tablet (1,000 mg total) by mouth as needed.  . [DISCONTINUED] amLODipine (NORVASC) 5 MG tablet Take 1 tablet (5 mg total) by mouth daily.  . [DISCONTINUED] enalapril (VASOTEC) 10 MG tablet Take 1 tablet (10 mg total) by mouth 2 (two) times daily.  . [DISCONTINUED] FLUoxetine (PROZAC) 40  MG capsule Take 1 capsule (40 mg total) by mouth daily.  . [DISCONTINUED] triamterene-hydrochlorothiazide (MAXZIDE-25) 37.5-25 MG tablet Take 1 tablet by mouth daily.   No facility-administered encounter medications on file as of 03/03/2017.     Review of Systems  Constitutional: Negative for appetite change and unexpected weight change.  HENT: Negative for congestion and sinus pressure.   Respiratory: Negative for cough, chest tightness and shortness of breath.   Cardiovascular: Negative for chest pain, palpitations and leg swelling.  Gastrointestinal: Negative for abdominal pain, diarrhea, nausea and vomiting.  Genitourinary: Negative for difficulty urinating and dysuria.  Musculoskeletal: Negative for back pain and joint swelling.  Skin: Negative for color change and rash.  Neurological: Negative for dizziness,  light-headedness and headaches.  Psychiatric/Behavioral: Negative for agitation and dysphoric mood.       Handling stress.         Objective:     Blood pressure rechecked by me:  120/78  Physical Exam  Constitutional: She appears well-developed and well-nourished. No distress.  HENT:  Nose: Nose normal.  Mouth/Throat: Oropharynx is clear and moist.  Neck: Neck supple. No thyromegaly present.  Cardiovascular: Normal rate and regular rhythm.  Pulmonary/Chest: Breath sounds normal. No respiratory distress. She has no wheezes.  Abdominal: Soft. Bowel sounds are normal. There is no tenderness.  Musculoskeletal: She exhibits no edema or tenderness.  Lymphadenopathy:    She has no cervical adenopathy.  Skin: No rash noted. No erythema.  Psychiatric: She has a normal mood and affect. Her behavior is normal.    BP 128/80   Pulse 76   Temp 98.2 F (36.8 C) (Oral)   Ht '5\' 8"'$  (1.727 m)   Wt 210 lb (95.3 kg)   SpO2 98%   BMI 31.93 kg/m  Wt Readings from Last 3 Encounters:  03/03/17 210 lb (95.3 kg)  01/06/17 209 lb 6.4 oz (95 kg)  10/06/16 204 lb 3.2 oz (92.6 kg)     Lab Results  Component Value Date   WBC 5.3 05/15/2016   HGB 14.5 05/15/2016   HCT 42.1 05/15/2016   PLT 281.0 05/15/2016   GLUCOSE 99 03/03/2017   CHOL 190 03/03/2017   TRIG 150.0 (H) 03/03/2017   HDL 56.70 03/03/2017   LDLCALC 103 (H) 03/03/2017   ALT 29 03/03/2017   AST 18 03/03/2017   NA 139 03/03/2017   K 3.9 03/03/2017   CL 101 03/03/2017   CREATININE 0.75 03/03/2017   BUN 15 03/03/2017   CO2 25 03/03/2017   TSH 1.78 05/15/2016   HGBA1C 5.6 03/03/2017       Assessment & Plan:   Problem List Items Addressed This Visit    Abnormal liver function test    Has significantly decreased her alcohol intake.  Have discussed diet and exercise.  Follow liver panel.       Anxiety    Back on prozac.  Gained weight with zoloft.  Feels better.  Doing better.  Does not feel needs any further  intervention.  Follow.       Relevant Medications   FLUoxetine (PROZAC) 40 MG capsule   Essential hypertension, benign   Relevant Medications   amLODipine (NORVASC) 5 MG tablet   enalapril (VASOTEC) 10 MG tablet   triamterene-hydrochlorothiazide (MAXZIDE-25) 37.5-25 MG tablet   Hypercholesterolemia   Relevant Medications   amLODipine (NORVASC) 5 MG tablet   enalapril (VASOTEC) 10 MG tablet   triamterene-hydrochlorothiazide (MAXZIDE-25) 37.5-25 MG tablet   Pre-op evaluation - Primary  Planning for knee surgery.  Here for pre op.  She is currently asymptomatic.  Denies any chest pain.  No sob.  EKG - SR with no acute ischemic changes.  Blood pressure doing well.  I feel from a cardiac stand point, she is a low risk to proceed with the planned surgery.  She will need close intra op and post op monitoring of her heart rate and blood pressure to avoid extremes.        Relevant Orders   EKG 12-Lead (Completed)   Right knee pain    Persistent.  Planning for surgery.  See pre op recommendations.  Form completed.         Other Visit Diagnoses    Hyperglycemia       Follow met b and a1c.        Einar Pheasant, MD

## 2017-03-03 NOTE — Progress Notes (Signed)
Pre visit review using our clinic review tool, if applicable. No additional management support is needed unless otherwise documented below in the visit note. 

## 2017-03-05 ENCOUNTER — Encounter: Payer: Self-pay | Admitting: Internal Medicine

## 2017-03-05 DIAGNOSIS — Z01818 Encounter for other preprocedural examination: Secondary | ICD-10-CM | POA: Insufficient documentation

## 2017-03-05 NOTE — Assessment & Plan Note (Signed)
Persistent.  Planning for surgery.  See pre op recommendations.  Form completed.

## 2017-03-05 NOTE — Assessment & Plan Note (Signed)
Back on prozac.  Gained weight with zoloft.  Feels better.  Doing better.  Does not feel needs any further intervention.  Follow.

## 2017-03-05 NOTE — Assessment & Plan Note (Signed)
Has significantly decreased her alcohol intake.  Have discussed diet and exercise.  Follow liver panel.

## 2017-03-05 NOTE — Telephone Encounter (Signed)
See pts my chart message.  I have completed the form.  Please fax the form, EKG and 03/03/17 office note to ortho.  Thanks

## 2017-03-05 NOTE — Assessment & Plan Note (Signed)
Planning for knee surgery.  Here for pre op.  She is currently asymptomatic.  Denies any chest pain.  No sob.  EKG - SR with no acute ischemic changes.  Blood pressure doing well.  I feel from a cardiac stand point, she is a low risk to proceed with the planned surgery.  She will need close intra op and post op monitoring of her heart rate and blood pressure to avoid extremes.

## 2017-03-09 ENCOUNTER — Ambulatory Visit: Payer: BC Managed Care – PPO | Admitting: Internal Medicine

## 2017-05-04 ENCOUNTER — Encounter: Payer: Self-pay | Admitting: Internal Medicine

## 2017-05-11 ENCOUNTER — Encounter: Payer: Self-pay | Admitting: Internal Medicine

## 2017-05-11 ENCOUNTER — Ambulatory Visit (INDEPENDENT_AMBULATORY_CARE_PROVIDER_SITE_OTHER): Payer: BC Managed Care – PPO | Admitting: Internal Medicine

## 2017-05-11 VITALS — BP 112/68 | HR 78 | Temp 98.7°F | Resp 18 | Wt 206.0 lb

## 2017-05-11 DIAGNOSIS — Z0000 Encounter for general adult medical examination without abnormal findings: Secondary | ICD-10-CM

## 2017-05-11 DIAGNOSIS — E7800 Pure hypercholesterolemia, unspecified: Secondary | ICD-10-CM

## 2017-05-11 DIAGNOSIS — R945 Abnormal results of liver function studies: Secondary | ICD-10-CM

## 2017-05-11 DIAGNOSIS — R7989 Other specified abnormal findings of blood chemistry: Secondary | ICD-10-CM

## 2017-05-11 DIAGNOSIS — I10 Essential (primary) hypertension: Secondary | ICD-10-CM

## 2017-05-11 DIAGNOSIS — F419 Anxiety disorder, unspecified: Secondary | ICD-10-CM | POA: Diagnosis not present

## 2017-05-11 DIAGNOSIS — R739 Hyperglycemia, unspecified: Secondary | ICD-10-CM | POA: Diagnosis not present

## 2017-05-11 DIAGNOSIS — E559 Vitamin D deficiency, unspecified: Secondary | ICD-10-CM

## 2017-05-11 DIAGNOSIS — C439 Malignant melanoma of skin, unspecified: Secondary | ICD-10-CM

## 2017-05-11 NOTE — Assessment & Plan Note (Addendum)
Physical today 05/11/17.  S/p hysterectomy.  Mammogram 12/03/16 - birads I.  Colonoscopy 03/26/14.  Recommended f/u in 5 years.

## 2017-05-11 NOTE — Progress Notes (Signed)
Patient ID: Jordan Benson, female   DOB: 17-Jan-1956, 62 y.o.   MRN: [id number]   Subjective:    Patient ID: Jordan Benson, female    DOB: [birth year], 62 y.o.   MRN: [id number]  HPI  Patient here for her physical exam.  Increased stress with her husband's health issues.  Discussed with her today.  She does not feel she needs anything more at this time.  Trying to stay active.  No chest pain.  No sob.  No acid reflux.  No abdominal pain.  Bowels moving.  She is exercising.  Riding her bike.  S/p right knee surgery.  Doing well.    Past Medical History:  Diagnosis Date  . Anxiety   . GERD (gastroesophageal reflux disease)   . Hypertension   . IBS (irritable bowel syndrome)   . Melanoma ([place])    followed by Dr Evorn Gong  . Sleep apnea   . Vitamin D deficiency    Past Surgical History:  Procedure Laterality Date  . ABDOMINAL HYSTERECTOMY  1992  . MOHS SURGERY  2012  . OVARIAN CYST REMOVAL  1991  . SEPTOPLASTY  2009  . TUBAL LIGATION  1985  . tummy tuck  1995  . WISDOM TOOTH EXTRACTION     Family History  Problem Relation Age of Onset  . Cancer Mother 41       breast cancer  . Heart disease Mother   . Hypertension Mother   . Diabetes Mother   . Breast cancer Mother 14  . Heart disease Father    Social History   Socioeconomic History  . Marital status: Divorced    Spouse name: Not on file  . Number of children: 2  . Years of education: Not on file  . Highest education level: Not on file  Occupational History    Employer: [employer]  . Financial resource strain: Not on file  . Food insecurity:    Worry: Not on file    Inability: Not on file  . Transportation needs:    Medical: Not on file    Non-medical: Not on file  Tobacco Use  . Smoking status: Former Research scientist (life sciences)  . Smokeless tobacco: Never Used  Substance and Sexual Activity  . Alcohol use: Yes    Alcohol/week: 0.0 oz  . Drug use: No  . Sexual activity: Not on file  Lifestyle  . Physical  activity:    Days per week: Not on file    Minutes per session: Not on file  . Stress: Not on file  Relationships  . Social connections:    Talks on phone: Not on file    Gets together: Not on file    Attends religious service: Not on file    Active member of club or organization: Not on file    Attends meetings of clubs or organizations: Not on file    Relationship status: Not on file  Other Topics Concern  . Not on file  Social History Narrative  . Not on file    Outpatient Encounter Medications as of 05/11/2017  Medication Sig  . ALPRAZolam (XANAX) 0.25 MG tablet TAKE 1 TABLET BY MOUTH DAILY AS NEEDED FOR ANXIETY  . amLODipine (NORVASC) 5 MG tablet Take 1 tablet (5 mg total) by mouth daily.  Marland Kitchen aspirin EC 81 MG tablet Take 81 mg by mouth daily.  . beta carotene w/minerals (OCUVITE) tablet Take 1 tablet by mouth daily.  . enalapril (VASOTEC) 10 MG tablet  Take 1 tablet (10 mg total) by mouth 2 (two) times daily.  Marland Kitchen FLUoxetine (PROZAC) 40 MG capsule Take 1 capsule (40 mg total) by mouth daily.  . milk thistle 175 MG tablet Take 175 mg by mouth daily.  Marland Kitchen triamterene-hydrochlorothiazide (MAXZIDE-25) 37.5-25 MG tablet Take 1 tablet by mouth daily.  . valACYclovir (VALTREX) 1000 MG tablet Take 1 tablet (1,000 mg total) by mouth as needed.   No facility-administered encounter medications on file as of 05/11/2017.     Review of Systems  Constitutional: Negative for appetite change and unexpected weight change.  HENT: Negative for congestion and sinus pressure.   Eyes: Negative for pain and visual disturbance.  Respiratory: Negative for cough, chest tightness and shortness of breath.   Cardiovascular: Negative for chest pain, palpitations and leg swelling.  Gastrointestinal: Negative for abdominal pain, diarrhea, nausea and vomiting.  Genitourinary: Negative for difficulty urinating and dysuria.  Musculoskeletal: Negative for joint swelling and myalgias.       S/p knee surgery and doing  well.    Skin: Negative for rash and wound.  Neurological: Negative for dizziness, light-headedness and headaches.  Hematological: Negative for adenopathy. Does not bruise/bleed easily.  Psychiatric/Behavioral: Negative for agitation and dysphoric mood.       Objective:     Blood pressure rechecked by me:  118/72  Physical Exam  Constitutional: She is oriented to person, place, and time. She appears well-developed and well-nourished. No distress.  HENT:  Nose: Nose normal.  Mouth/Throat: Oropharynx is clear and moist.  Eyes: Right eye exhibits no discharge. Left eye exhibits no discharge. No scleral icterus.  Neck: Neck supple. No thyromegaly present.  Cardiovascular: Normal rate and regular rhythm.  Pulmonary/Chest: Breath sounds normal. No accessory muscle usage. No tachypnea. No respiratory distress. She has no decreased breath sounds. She has no wheezes. She has no rhonchi. Right breast exhibits no inverted nipple, no mass, no nipple discharge and no tenderness (no axillary adenopathy). Left breast exhibits no inverted nipple, no mass, no nipple discharge and no tenderness (no axilarry adenopathy).  Abdominal: Soft. Bowel sounds are normal. There is no tenderness.  Musculoskeletal: She exhibits no edema or tenderness.  Lymphadenopathy:    She has no cervical adenopathy.  Neurological: She is alert and oriented to person, place, and time.  Skin: Skin is warm. No rash noted. No erythema.  Psychiatric: She has a normal mood and affect. Her behavior is normal.    BP 112/68   Pulse 78   Temp 98.7 F (37.1 C) (Oral)   Resp 18   Wt 206 lb (93.4 kg)   SpO2 98%   BMI 31.32 kg/m  Wt Readings from Last 3 Encounters:  05/11/17 206 lb (93.4 kg)  03/03/17 210 lb (95.3 kg)  01/06/17 209 lb 6.4 oz (95 kg)     Lab Results  Component Value Date   WBC 5.3 05/15/2016   HGB 14.5 05/15/2016   HCT 42.1 05/15/2016   PLT 281.0 05/15/2016   GLUCOSE 99 03/03/2017   CHOL 190 03/03/2017     TRIG 150.0 (H) 03/03/2017   HDL 56.70 03/03/2017   LDLCALC 103 (H) 03/03/2017   ALT 29 03/03/2017   AST 18 03/03/2017   NA 139 03/03/2017   K 3.9 03/03/2017   CL 101 03/03/2017   CREATININE 0.75 03/03/2017   BUN 15 03/03/2017   CO2 25 03/03/2017   TSH 1.78 05/15/2016   HGBA1C 5.6 03/03/2017    Mm Screening Breast Tomo Bilateral  Result  Date: 12/03/2016 CLINICAL DATA:  Screening. EXAM: 2D DIGITAL SCREENING BILATERAL MAMMOGRAM WITH CAD AND ADJUNCT TOMO COMPARISON:  Previous exam(s). ACR Breast Density Category b: There are scattered areas of fibroglandular density. FINDINGS: There are no findings suspicious for malignancy. Images were processed with CAD. IMPRESSION: No mammographic evidence of malignancy. A result letter of this screening mammogram will be mailed directly to the patient. RECOMMENDATION: Screening mammogram in one year. (Code:SM-B-01Y) BI-RADS CATEGORY  1: Negative. Electronically Signed   By: Marin Olp M.D.   On: 12/03/2016 16:14       Assessment & Plan:   Problem List Items Addressed This Visit    Abnormal liver function test    Has significantly decreased her alcohol intake.  Discussed diet and exercise.  Follow liver panel.       Relevant Orders   Hepatic function panel   Anxiety    On prozac.  Overall she feels she is handling things relatively well.  Does not feel she needs anything more.  Follow.        Essential hypertension, benign    Blood pressure under good control.  Continue same medication regimen.  Follow pressures.  Follow metabolic panel.        Relevant Orders   CBC with Differential/Platelet   TSH   Basic metabolic panel   Health care maintenance    Physical today 05/11/17.  S/p hysterectomy.  Mammogram 12/03/16 - birads I.  Colonoscopy 03/26/14.  Recommended f/u in 5 years.        Hypercholesterolemia    Low cholesterol diet and exercise.  Follow lipid panel.        Relevant Orders   Lipid panel   Melanoma ([place])    Followed  by Dr Evorn Gong.        Vitamin D deficiency    Follow vitamin D level.        Other Visit Diagnoses    Routine general medical examination at a health care facility    -  Primary   Hyperglycemia       Relevant Orders   Hemoglobin A1c       Einar Pheasant, MD

## 2017-05-17 ENCOUNTER — Encounter: Payer: Self-pay | Admitting: Internal Medicine

## 2017-05-17 NOTE — Assessment & Plan Note (Signed)
Follow vitamin D level.  

## 2017-05-17 NOTE — Assessment & Plan Note (Signed)
Has significantly decreased her alcohol intake.  Discussed diet and exercise.  Follow liver panel.

## 2017-05-17 NOTE — Assessment & Plan Note (Signed)
Blood pressure under good control.  Continue same medication regimen.  Follow pressures.  Follow metabolic panel.   

## 2017-05-17 NOTE — Assessment & Plan Note (Signed)
On prozac.  Overall she feels she is handling things relatively well.  Does not feel she needs anything more.  Follow.

## 2017-05-17 NOTE — Assessment & Plan Note (Signed)
Low cholesterol diet and exercise.  Follow lipid panel.   

## 2017-05-17 NOTE — Assessment & Plan Note (Signed)
Followed by Dr Dasher.   

## 2017-05-26 ENCOUNTER — Other Ambulatory Visit: Payer: Self-pay | Admitting: Internal Medicine

## 2017-05-26 NOTE — Telephone Encounter (Signed)
Last OV 05/11/2017 Next OV 08/17/2017 Last refill 12/15/2016

## 2017-05-27 MED ORDER — ALPRAZOLAM 0.25 MG PO TABS: 0.2500 mg | ORAL_TABLET | Freq: Every day | ORAL | 0 refills | Status: DC | PRN

## 2017-07-12 ENCOUNTER — Encounter: Payer: Self-pay | Admitting: Internal Medicine

## 2017-07-13 NOTE — Telephone Encounter (Signed)
08/17/17 appt is just a f/u office visit.  Can we schedule the f/u office visit and the pre op evaluation earlier to avoid her from having two visits.  That way we can confirm nothing new prior to surgery.  Please let pt know and schedule appt.

## 2017-07-14 ENCOUNTER — Encounter: Payer: Self-pay | Admitting: Internal Medicine

## 2017-07-14 NOTE — Telephone Encounter (Signed)
When is her surgery?  I thought it was not until July.  Can we schedule for after she returns?

## 2017-07-14 NOTE — Telephone Encounter (Signed)
Spoke with patient to try to move appt up. Pt stated that she is out of town the whole last week of June. I advised that you were out of office on June 10 as well as the whole third week of June. Is it okay to use a 12:30 slot next week?

## 2017-07-15 NOTE — Telephone Encounter (Signed)
Patient is going to keep July 9 appt. Her surgery is not until July 19

## 2017-07-19 ENCOUNTER — Telehealth: Payer: Self-pay

## 2017-07-19 ENCOUNTER — Encounter: Payer: Self-pay | Admitting: Internal Medicine

## 2017-07-19 NOTE — Telephone Encounter (Signed)
Scheduled for 07/22/17 at 2

## 2017-07-19 NOTE — Telephone Encounter (Signed)
Copied from [facility] [id number]. Topic: General - Other >> Jul 19, 2017  8:49 AM Oneta Rack wrote: Relation to pt: self Call back [place]   Reason for call:  Patient states specialist is unable to schedule total knee replacement surgery for July 19th until patient is clear, patient seeking to cancel her 08/17/17 and would like to be seen by PCP this week or next week to secure surgery date, patient informed PCP 100% booked and would like to know if she can be squeezed in,please advise >> Jul 19, 2017  8:54 AM Oneta Rack wrote: Relation to pt: self Call back [place]   Reason for call:  Patient states specialist is unable to schedule total knee replacement surgery for July 19th until patient is clear, patient seeking to cancel her 08/17/17 and would like to be seen by PCP this week or next week to secure surgery date, patient informed PCP 100% booked and would like to know if she can be squeezed in,please advise

## 2017-07-22 ENCOUNTER — Ambulatory Visit: Payer: BC Managed Care – PPO | Admitting: Internal Medicine

## 2017-07-22 ENCOUNTER — Encounter: Payer: Self-pay | Admitting: Internal Medicine

## 2017-07-22 DIAGNOSIS — R7989 Other specified abnormal findings of blood chemistry: Secondary | ICD-10-CM

## 2017-07-22 DIAGNOSIS — F419 Anxiety disorder, unspecified: Secondary | ICD-10-CM | POA: Diagnosis not present

## 2017-07-22 DIAGNOSIS — E7800 Pure hypercholesterolemia, unspecified: Secondary | ICD-10-CM

## 2017-07-22 DIAGNOSIS — C439 Malignant melanoma of skin, unspecified: Secondary | ICD-10-CM

## 2017-07-22 DIAGNOSIS — I10 Essential (primary) hypertension: Secondary | ICD-10-CM

## 2017-07-22 DIAGNOSIS — Z01818 Encounter for other preprocedural examination: Secondary | ICD-10-CM | POA: Diagnosis not present

## 2017-07-22 DIAGNOSIS — R945 Abnormal results of liver function studies: Secondary | ICD-10-CM

## 2017-07-22 NOTE — Progress Notes (Signed)
Patient ID: Jordan Benson, female   DOB: [birth year], 62 y.o.   MRN: [id number]   Subjective:    Patient ID: Jordan Benson, female    DOB: [birth year], 62 y.o.   MRN: [id number]  HPI  Patient here as a work in for pre op evaluation.  Is s/p recent right knee surgery.  Now with increased pain.  Limits her activity.  Planning for TKR in July.  States other than her knee, she is doing well.  No chest pain.  No sob.  No acid reflux.  No abdominal pain.  Bowels moving.  No urine change.  Overall feels good, other than her knee.  No anesthesia problems.     Past Medical History:  Diagnosis Date  . Anxiety   . GERD (gastroesophageal reflux disease)   . Hypertension   . IBS (irritable bowel syndrome)   . Melanoma ([place])    followed by Dr Evorn Gong  . Sleep apnea   . Vitamin D deficiency    Past Surgical History:  Procedure Laterality Date  . ABDOMINAL HYSTERECTOMY  1992  . MOHS SURGERY  2012  . OVARIAN CYST REMOVAL  1991  . SEPTOPLASTY  2009  . TUBAL LIGATION  1985  . tummy tuck  1995  . WISDOM TOOTH EXTRACTION     Family History  Problem Relation Age of Onset  . Cancer Mother 45       breast cancer  . Heart disease Mother   . Hypertension Mother   . Diabetes Mother   . Breast cancer Mother 4  . Heart disease Father    Social History   Socioeconomic History  . Marital status: Married    Spouse name: Not on file  . Number of children: 2  . Years of education: Not on file  . Highest education level: Not on file  Occupational History    Employer: [employer]  . Financial resource strain: Not on file  . Food insecurity:    Worry: Not on file    Inability: Not on file  . Transportation needs:    Medical: Not on file    Non-medical: Not on file  Tobacco Use  . Smoking status: Former Research scientist (life sciences)  . Smokeless tobacco: Never Used  Substance and Sexual Activity  . Alcohol use: Yes    Alcohol/week: 0.0 oz  . Drug use: No  . Sexual activity: Not on file  Lifestyle    . Physical activity:    Days per week: Not on file    Minutes per session: Not on file  . Stress: Not on file  Relationships  . Social connections:    Talks on phone: Not on file    Gets together: Not on file    Attends religious service: Not on file    Active member of club or organization: Not on file    Attends meetings of clubs or organizations: Not on file    Relationship status: Not on file  Other Topics Concern  . Not on file  Social History Narrative  . Not on file    Outpatient Encounter Medications as of 07/22/2017  Medication Sig  . ALPRAZolam (XANAX) 0.25 MG tablet Take 1 tablet (0.25 mg total) by mouth daily as needed. for anxiety  . amLODipine (NORVASC) 5 MG tablet Take 1 tablet (5 mg total) by mouth daily.  . beta carotene w/minerals (OCUVITE) tablet Take 1 tablet by mouth daily.  . enalapril (VASOTEC) 10 MG tablet Take 1  tablet (10 mg total) by mouth 2 (two) times daily.  Marland Kitchen FLUoxetine (PROZAC) 40 MG capsule Take 1 capsule (40 mg total) by mouth daily.  . milk thistle 175 MG tablet Take 175 mg by mouth daily.  Marland Kitchen triamterene-hydrochlorothiazide (MAXZIDE-25) 37.5-25 MG tablet Take 1 tablet by mouth daily.  . valACYclovir (VALTREX) 1000 MG tablet Take 1 tablet (1,000 mg total) by mouth as needed.  Marland Kitchen aspirin EC 81 MG tablet Take 81 mg by mouth daily.   No facility-administered encounter medications on file as of 07/22/2017.     Review of Systems  Constitutional: Negative for appetite change and unexpected weight change.  HENT: Negative for congestion and sinus pressure.   Respiratory: Negative for cough, chest tightness and shortness of breath.   Cardiovascular: Negative for chest pain, palpitations and leg swelling.  Gastrointestinal: Negative for abdominal pain, diarrhea, nausea and vomiting.  Genitourinary: Negative for difficulty urinating and dysuria.  Musculoskeletal: Negative for joint swelling and myalgias.  Skin: Negative for color change and rash.   Neurological: Negative for dizziness, light-headedness and headaches.  Psychiatric/Behavioral: Negative for agitation and dysphoric mood.       Objective:    Physical Exam  Constitutional: She appears well-developed and well-nourished. No distress.  HENT:  Nose: Nose normal.  Mouth/Throat: Oropharynx is clear and moist.  Neck: Neck supple. No thyromegaly present.  Cardiovascular: Normal rate and regular rhythm.  Pulmonary/Chest: Breath sounds normal. No respiratory distress. She has no wheezes.  Abdominal: Soft. Bowel sounds are normal. There is no tenderness.  Musculoskeletal: She exhibits no edema or tenderness.  Lymphadenopathy:    She has no cervical adenopathy.  Skin: No rash noted. No erythema.  Psychiatric: She has a normal mood and affect. Her behavior is normal.    BP 134/82 (BP Location: Left Arm, Patient Position: Sitting, Cuff Size: Large)   Pulse 87   Temp 98.8 F (37.1 C) (Oral)   Resp 15   Ht 5\' 8"  (1.727 m)   Wt 207 lb 9.6 oz (94.2 kg)   SpO2 96%   BMI 31.57 kg/m  Wt Readings from Last 3 Encounters:  07/22/17 207 lb 9.6 oz (94.2 kg)  05/11/17 206 lb (93.4 kg)  03/03/17 210 lb (95.3 kg)     Lab Results  Component Value Date   WBC 5.3 05/15/2016   HGB 14.5 05/15/2016   HCT 42.1 05/15/2016   PLT 281.0 05/15/2016   GLUCOSE 99 03/03/2017   CHOL 190 03/03/2017   TRIG 150.0 (H) 03/03/2017   HDL 56.70 03/03/2017   LDLCALC 103 (H) 03/03/2017   ALT 29 03/03/2017   AST 18 03/03/2017   NA 139 03/03/2017   K 3.9 03/03/2017   CL 101 03/03/2017   CREATININE 0.75 03/03/2017   BUN 15 03/03/2017   CO2 25 03/03/2017   TSH 1.78 05/15/2016   HGBA1C 5.6 03/03/2017    Mm Screening Breast Tomo Bilateral  Result Date: 12/03/2016 CLINICAL DATA:  Screening. EXAM: 2D DIGITAL SCREENING BILATERAL MAMMOGRAM WITH CAD AND ADJUNCT TOMO COMPARISON:  Previous exam(s). ACR Breast Density Category b: There are scattered areas of fibroglandular density. FINDINGS: There  are no findings suspicious for malignancy. Images were processed with CAD. IMPRESSION: No mammographic evidence of malignancy. A result letter of this screening mammogram will be mailed directly to the patient. RECOMMENDATION: Screening mammogram in one year. (Code:SM-B-01Y) BI-RADS CATEGORY  1: Negative. Electronically Signed   By: Marin Olp M.D.   On: 12/03/2016 16:14  Assessment & Plan:   Problem List Items Addressed This Visit    Abnormal liver function test    Still with decreased alcohol intake.  Discussed diet and exercise.  Follow liver panel.        Anxiety    On prozac.  Overall doing well.  Follow.        Essential hypertension, benign    Blood pressure on recheck by me:  122/80.  Continue same medication regimen.  Will need close intra op and post op monitoring of heart rate and blood pressure to avoid extremes.  Follow.        Hypercholesterolemia    Low cholesterol diet and exercise.  Follow lipid panel.        Melanoma ([place])    Followed by dermatology.        Pre-op evaluation    Had recent knee surgery.  No problems.  Had EKG prior.  EKG - SR with no acute ischemic changes. Given recent surgery with no problems and no chest pain, sob or other acute symptoms, I feel she is at low risk from a cardiac standpoint to proceed with planned surgery.  She will need close intra op and post op monitoring of her heart rate and blood pressure to avoid extremes.            Einar Pheasant, MD

## 2017-07-25 ENCOUNTER — Encounter: Payer: Self-pay | Admitting: Internal Medicine

## 2017-07-25 ENCOUNTER — Other Ambulatory Visit: Payer: Self-pay | Admitting: Internal Medicine

## 2017-07-25 NOTE — Assessment & Plan Note (Signed)
Followed by dermatology

## 2017-07-25 NOTE — Assessment & Plan Note (Signed)
Low cholesterol diet and exercise.  Follow lipid panel.   

## 2017-07-25 NOTE — Assessment & Plan Note (Signed)
Blood pressure on recheck by me:  122/80.  Continue same medication regimen.  Will need close intra op and post op monitoring of heart rate and blood pressure to avoid extremes.  Follow.

## 2017-07-25 NOTE — Assessment & Plan Note (Signed)
Still with decreased alcohol intake.  Discussed diet and exercise.  Follow liver panel.

## 2017-07-25 NOTE — Assessment & Plan Note (Signed)
On prozac.  Overall doing well.  Follow.

## 2017-07-25 NOTE — Assessment & Plan Note (Addendum)
Had recent knee surgery.  No problems.  Had EKG prior.  EKG - SR with no acute ischemic changes. Given recent surgery with no problems and no chest pain, sob or other acute symptoms, I feel she is at low risk from a cardiac standpoint to proceed with planned surgery.  She will need close intra op and post op monitoring of her heart rate and blood pressure to avoid extremes.

## 2017-07-26 ENCOUNTER — Encounter: Payer: Self-pay | Admitting: Internal Medicine

## 2017-07-27 ENCOUNTER — Encounter: Payer: Self-pay | Admitting: Internal Medicine

## 2017-07-27 NOTE — Telephone Encounter (Signed)
Patient came in office and explained to her that I do not have form and PCP is out of the office. Will be faxed upon return.

## 2017-07-27 NOTE — Telephone Encounter (Signed)
Patient called back needing paperwork for surgical clearance faxed to [facility] orthro attention Derl Barrow . Phone # [phone]. Per Dr. Bary Leriche nurse she will give them a call.

## 2017-08-04 ENCOUNTER — Ambulatory Visit: Payer: Self-pay | Admitting: Orthopedic Surgery

## 2017-08-11 NOTE — Progress Notes (Signed)
03-03-17 (Epic) EKG  07-23-17 Surgical Clearance from Dr. Nicki Reaper on chart

## 2017-08-11 NOTE — Patient Instructions (Addendum)
Jordan Benson  08/11/2017   Your procedure is scheduled on: 08-27-17   Report to [facility] Main  Entrance    Report to Admitting at 1:15 PM    Call this number if you have problems the morning of surgery [phone]    Remember: Do not eat food or drink liquids :After Midnight. You may have a Clear Liquid Diet from Midnight until 9:45 AM. After 9:45 AM, nothing until after surgery.     CLEAR LIQUID DIET   Foods Allowed                                                                     Foods Excluded  Coffee and tea, regular and decaf                             liquids that you cannot  Plain Jell-O in any flavor                                             see through such as: Fruit ices (not with fruit pulp)                                     milk, soups, orange juice  Iced Popsicles                                    All solid food Carbonated beverages, regular and diet                                    Cranberry, grape and apple juices Sports drinks like Gatorade Lightly seasoned clear broth or consume(fat free) Sugar, honey syrup  Sample Menu Breakfast                                Lunch                                     Supper Cranberry juice                    Beef broth                            Chicken broth Jell-O                                     Grape juice  Apple juice Coffee or tea                        Jell-O                                      Popsicle                                                Coffee or tea                        Coffee or tea  _____________________________________________________________________     Take these medicines the morning of surgery with A SIP OF WATER: Amlodipine (Norvasc), and Fluoxetine (Prozac)                                You may not have any metal on your body including hair pins and              piercings  Do not wear jewelry, make-up, lotions, powders  or perfumes, deodorant             Do not wear nail polish.  Do not shave  48 hours prior to surgery.               Do not bring valuables to the hospital. [place].  Contacts, dentures or bridgework may not be worn into surgery.  Leave suitcase in the car. After surgery it may be brought to your room.       Special Instructions: N/A              Please read over the following fact sheets you were given: _____________________________________________________________________             [facility] - Preparing for Surgery Before surgery, you can play an important role.  Because skin is not sterile, your skin needs to be as free of germs as possible.  You can reduce the number of germs on your skin by washing with CHG (chlorahexidine gluconate) soap before surgery.  CHG is an antiseptic cleaner which kills germs and bonds with the skin to continue killing germs even after washing. Please DO NOT use if you have an allergy to CHG or antibacterial soaps.  If your skin becomes reddened/irritated stop using the CHG and inform your nurse when you arrive at Short Stay. Do not shave (including legs and underarms) for at least 48 hours prior to the first CHG shower.  You may shave your face/neck. Please follow these instructions carefully:  1.  Shower with CHG Soap the night before surgery and the  morning of Surgery.  2.  If you choose to wash your hair, wash your hair first as usual with your  normal  shampoo.  3.  After you shampoo, rinse your hair and body thoroughly to remove the  shampoo.                           4.  Use CHG as you would  any other liquid soap.  You can apply chg directly  to the skin and wash                       Gently with a scrungie or clean washcloth.  5.  Apply the CHG Soap to your body ONLY FROM THE NECK DOWN.   Do not use on face/ open                           Wound or open sores. Avoid contact with eyes, ears mouth  and genitals (private parts).                       Wash face,  Genitals (private parts) with your normal soap.             6.  Wash thoroughly, paying special attention to the area where your surgery  will be performed.  7.  Thoroughly rinse your body with warm water from the neck down.  8.  DO NOT shower/wash with your normal soap after using and rinsing off  the CHG Soap.                9.  Pat yourself dry with a clean towel.            10.  Wear clean pajamas.            11.  Place clean sheets on your bed the night of your first shower and do not  sleep with pets. Day of Surgery : Do not apply any lotions/deodorants the morning of surgery.  Please wear clean clothes to the hospital/surgery center.  FAILURE TO FOLLOW THESE INSTRUCTIONS MAY RESULT IN THE CANCELLATION OF YOUR SURGERY PATIENT SIGNATURE_________________________________  NURSE SIGNATURE__________________________________  ________________________________________________________________________   Adam Phenix  An incentive spirometer is a tool that can help keep your lungs clear and active. This tool measures how well you are filling your lungs with each breath. Taking long deep breaths may help reverse or decrease the chance of developing breathing (pulmonary) problems (especially infection) following:  A long period of time when you are unable to move or be active. BEFORE THE PROCEDURE   If the spirometer includes an indicator to show your best effort, your nurse or respiratory therapist will set it to a desired goal.  If possible, sit up straight or lean slightly forward. Try not to slouch.  Hold the incentive spirometer in an upright position. INSTRUCTIONS FOR USE  1. Sit on the edge of your bed if possible, or sit up as far as you can in bed or on a chair. 2. Hold the incentive spirometer in an upright position. 3. Breathe out normally. 4. Place the mouthpiece in your mouth and seal your lips tightly  around it. 5. Breathe in slowly and as deeply as possible, raising the piston or the ball toward the top of the column. 6. Hold your breath for 3-5 seconds or for as long as possible. Allow the piston or ball to fall to the bottom of the column. 7. Remove the mouthpiece from your mouth and breathe out normally. 8. Rest for a few seconds and repeat Steps 1 through 7 at least 10 times every 1-2 hours when you are awake. Take your time and take a few normal breaths between deep breaths. 9. The spirometer may include an indicator to show your best effort. Use the indicator  as a goal to work toward during each repetition. 10. After each set of 10 deep breaths, practice coughing to be sure your lungs are clear. If you have an incision (the cut made at the time of surgery), support your incision when coughing by placing a pillow or rolled up towels firmly against it. Once you are able to get out of bed, walk around indoors and cough well. You may stop using the incentive spirometer when instructed by your caregiver.  RISKS AND COMPLICATIONS  Take your time so you do not get dizzy or light-headed.  If you are in pain, you may need to take or ask for pain medication before doing incentive spirometry. It is harder to take a deep breath if you are having pain. AFTER USE  Rest and breathe slowly and easily.  It can be helpful to keep track of a log of your progress. Your caregiver can provide you with a simple table to help with this. If you are using the spirometer at home, follow these instructions: [place] IF:   You are having difficultly using the spirometer.  You have trouble using the spirometer as often as instructed.  Your pain medication is not giving enough relief while using the spirometer.  You develop fever of 100.5 F (38.1 C) or higher. SEEK IMMEDIATE MEDICAL CARE IF:   You cough up bloody sputum that had not been present before.  You develop fever of 102 F (38.9 C) or  greater.  You develop worsening pain at or near the incision site. MAKE SURE YOU:   Understand these instructions.  Will watch your condition.  Will get help right away if you are not doing well or get worse. Document Released: 06/08/2006 Document Revised: 04/20/2011 Document Reviewed: 08/09/2006 Exit[employer] Patient Information 2014 Exit[employer], [place].   ________________________________________________________________________  WHAT IS A BLOOD TRANSFUSION? Blood Transfusion Information  A transfusion is the replacement of blood or some of its parts. Blood is made up of multiple cells which provide different functions.  Red blood cells carry oxygen and are used for blood loss replacement.  White blood cells fight against infection.  Platelets control bleeding.  Plasma helps clot blood.  Other blood products are available for specialized needs, such as hemophilia or other clotting disorders. BEFORE THE TRANSFUSION  Who gives blood for transfusions?   Healthy volunteers who are fully evaluated to make sure their blood is safe. This is blood bank blood. Transfusion therapy is the safest it has ever been in the practice of medicine. Before blood is taken from a donor, a complete history is taken to make sure that person has no history of diseases nor engages in risky social behavior (examples are intravenous drug use or sexual activity with multiple partners). The donor's travel history is screened to minimize risk of transmitting infections, such as malaria. The donated blood is tested for signs of infectious diseases, such as HIV and hepatitis. The blood is then tested to be sure it is compatible with you in order to minimize the chance of a transfusion reaction. If you or a relative donates blood, this is often done in anticipation of surgery and is not appropriate for emergency situations. It takes many days to process the donated blood. RISKS AND COMPLICATIONS Although transfusion therapy  is very safe and saves many lives, the main dangers of transfusion include:   Getting an infectious disease.  Developing a transfusion reaction. This is an allergic reaction to something in the blood you were  given. Every precaution is taken to prevent this. The decision to have a blood transfusion has been considered carefully by your caregiver before blood is given. Blood is not given unless the benefits outweigh the risks. AFTER THE TRANSFUSION  Right after receiving a blood transfusion, you will usually feel much better and more energetic. This is especially true if your red blood cells have gotten low (anemic). The transfusion raises the level of the red blood cells which carry oxygen, and this usually causes an energy increase.  The nurse administering the transfusion will monitor you carefully for complications. HOME CARE INSTRUCTIONS  No special instructions are needed after a transfusion. You may find your energy is better. Speak with your caregiver about any limitations on activity for underlying diseases you may have. SEEK MEDICAL CARE IF:   Your condition is not improving after your transfusion.  You develop redness or irritation at the intravenous (IV) site. SEEK IMMEDIATE MEDICAL CARE IF:  Any of the following symptoms occur over the next 12 hours:  Shaking chills.  You have a temperature by mouth above 102 F (38.9 C), not controlled by medicine.  Chest, back, or muscle pain.  People around you feel you are not acting correctly or are confused.  Shortness of breath or difficulty breathing.  Dizziness and fainting.  You get a rash or develop hives.  You have a decrease in urine output.  Your urine turns a dark color or changes to pink, red, or brown. Any of the following symptoms occur over the next 10 days:  You have a temperature by mouth above 102 F (38.9 C), not controlled by medicine.  Shortness of breath.  Weakness after normal activity.  The white  part of the eye turns yellow (jaundice).  You have a decrease in the amount of urine or are urinating less often.  Your urine turns a dark color or changes to pink, red, or brown. Document Released: 01/24/2000 Document Revised: 04/20/2011 Document Reviewed: 09/12/2007 [facility] Patient Information 2014 [place], [place].  _______________________________________________________________________

## 2017-08-13 ENCOUNTER — Other Ambulatory Visit: Payer: Self-pay

## 2017-08-13 ENCOUNTER — Encounter (HOSPITAL_COMMUNITY): Payer: Self-pay

## 2017-08-13 ENCOUNTER — Encounter (HOSPITAL_COMMUNITY)
Admission: RE | Admit: 2017-08-13 | Discharge: 2017-08-13 | Disposition: A | Discharge status: Home / Self Care | Payer: BC Managed Care – PPO | Source: Ambulatory Visit | Attending: Specialist | Admitting: Specialist

## 2017-08-13 DIAGNOSIS — I10 Essential (primary) hypertension: Secondary | ICD-10-CM | POA: Diagnosis not present

## 2017-08-13 DIAGNOSIS — F419 Anxiety disorder, unspecified: Secondary | ICD-10-CM | POA: Diagnosis not present

## 2017-08-13 DIAGNOSIS — Z90710 Acquired absence of both cervix and uterus: Secondary | ICD-10-CM | POA: Insufficient documentation

## 2017-08-13 DIAGNOSIS — C439 Malignant melanoma of skin, unspecified: Secondary | ICD-10-CM | POA: Insufficient documentation

## 2017-08-13 DIAGNOSIS — Z87891 Personal history of nicotine dependence: Secondary | ICD-10-CM | POA: Insufficient documentation

## 2017-08-13 DIAGNOSIS — Z79899 Other long term (current) drug therapy: Secondary | ICD-10-CM | POA: Insufficient documentation

## 2017-08-13 DIAGNOSIS — R945 Abnormal results of liver function studies: Secondary | ICD-10-CM | POA: Insufficient documentation

## 2017-08-13 DIAGNOSIS — Z01812 Encounter for preprocedural laboratory examination: Secondary | ICD-10-CM | POA: Diagnosis present

## 2017-08-13 DIAGNOSIS — Z7982 Long term (current) use of aspirin: Secondary | ICD-10-CM | POA: Diagnosis not present

## 2017-08-13 DIAGNOSIS — Z9851 Tubal ligation status: Secondary | ICD-10-CM | POA: Insufficient documentation

## 2017-08-13 DIAGNOSIS — E7800 Pure hypercholesterolemia, unspecified: Secondary | ICD-10-CM | POA: Diagnosis not present

## 2017-08-13 DIAGNOSIS — Z9889 Other specified postprocedural states: Secondary | ICD-10-CM | POA: Diagnosis not present

## 2017-08-13 LAB — CBC
HCT: 42.5 % (ref 36.0–46.0)
Hemoglobin: 14.5 g/dL (ref 12.0–15.0)
MCH: 31.5 pg (ref 26.0–34.0)
MCHC: 34.1 g/dL (ref 30.0–36.0)
MCV: 92.2 fL (ref 78.0–100.0)
PLATELETS: 334 10*3/uL (ref 150–400)
RBC: 4.61 MIL/uL (ref 3.87–5.11)
RDW: 12 % (ref 11.5–15.5)
WBC: 8.5 10*3/uL (ref 4.0–10.5)

## 2017-08-13 LAB — URINALYSIS, ROUTINE W REFLEX MICROSCOPIC
BACTERIA UA: NONE SEEN
Bilirubin Urine: NEGATIVE
Glucose, UA: NEGATIVE mg/dL
HGB URINE DIPSTICK: NEGATIVE
KETONES UR: NEGATIVE mg/dL
NITRITE: NEGATIVE
PROTEIN: NEGATIVE mg/dL
Specific Gravity, Urine: 1.006 (ref 1.005–1.030)
pH: 6 (ref 5.0–8.0)

## 2017-08-13 LAB — PROTIME-INR
INR: 0.94
Prothrombin Time: 12.4 seconds (ref 11.4–15.2)

## 2017-08-13 LAB — BASIC METABOLIC PANEL
Anion gap: 10 (ref 5–15)
BUN: 20 mg/dL (ref 8–23)
CHLORIDE: 100 mmol/L (ref 98–111)
CO2: 28 mmol/L (ref 22–32)
CREATININE: 0.92 mg/dL (ref 0.44–1.00)
Calcium: 9.6 mg/dL (ref 8.9–10.3)
GFR calc non Af Amer: >60 mL/min (ref >60)
Glucose, Bld: 107 mg/dL — ABNORMAL HIGH (ref 70–99)
Potassium: 4.6 mmol/L (ref 3.5–5.1)
Sodium: 138 mmol/L (ref 135–145)

## 2017-08-13 LAB — APTT: aPTT: 28 seconds (ref 24–36)

## 2017-08-13 LAB — SURGICAL PCR SCREEN
MRSA, PCR: NEGATIVE
Staphylococcus aureus: NEGATIVE

## 2017-08-16 NOTE — H&P (Signed)
TOTAL KNEE ADMISSION H&P  Patient is being admitted for right total knee arthroplasty.  Subjective:  Chief Complaint:right knee pain.  HPI: Jordan Benson, 62 y.o. female, has a history of pain and functional disability in the right knee due to arthritis and has failed non-surgical conservative treatments for greater than 12 weeks to includecorticosteriod injections, viscosupplementation injections, supervised PT with diminished ADL's post treatment and weight reduction as appropriate.  Onset of symptoms was gradual, starting 7 years ago with gradually worsening course since that time. The patient noted prior procedures on the knee to include  arthroscopy on the right knee(s).  Patient currently rates pain in the right knee(s) at 7 out of 10 with activity. Patient has worsening of pain with activity and weight bearing, pain that interferes with activities of daily living and joint swelling.  Patient has evidence of subchondral sclerosis, periarticular osteophytes and joint space narrowing by imaging studies. There is no active infection.  Patient Active Problem List   Diagnosis Date Noted  . Pre-op evaluation 03/05/2017  . Right knee pain 05/15/2016  . Hyperbilirubinemia 03/10/2015  . Palpitations 10/22/2014  . Abnormal liver function test 10/22/2014  . Health care maintenance 04/05/2014  . Family history of colonic polyps 09/29/2012  . Essential hypertension, benign 06/09/2012  . IBS (irritable bowel syndrome) 06/09/2012  . GERD (gastroesophageal reflux disease) 06/09/2012  . Anxiety 06/09/2012  . Vitamin D deficiency 06/09/2012  . Melanoma ([place]) 06/09/2012  . Hypercholesterolemia 06/09/2012   Past Medical History:  Diagnosis Date  . Anxiety   . GERD (gastroesophageal reflux disease)   . Hypertension   . IBS (irritable bowel syndrome)   . Melanoma ([place])    followed by Dr Evorn Gong  . Sleep apnea   . Vitamin D deficiency     Past Surgical History:  Procedure Laterality Date  .  ABDOMINAL HYSTERECTOMY  1992  . MOHS SURGERY  2012  . OVARIAN CYST REMOVAL  1991  . SEPTOPLASTY  2009  . TUBAL LIGATION  1985  . tummy tuck  1995  . WISDOM TOOTH EXTRACTION      No current facility-administered medications for this encounter.    Current Outpatient Medications  Medication Sig Dispense Refill Last Dose  . ALPRAZolam (XANAX) 0.25 MG tablet Take 1 tablet (0.25 mg total) by mouth daily as needed. for anxiety (Patient taking differently: Take 0.25 mg by mouth daily as needed for anxiety. ) 30 tablet 0 Taking  . amLODipine (NORVASC) 5 MG tablet Take 1 tablet (5 mg total) by mouth daily. 90 tablet 1 Taking  . enalapril (VASOTEC) 10 MG tablet Take 1 tablet (10 mg total) by mouth 2 (two) times daily. 180 tablet 2 Taking  . FLUoxetine (PROZAC) 40 MG capsule Take 1 capsule (40 mg total) by mouth daily. 90 capsule 1 Taking  . milk thistle 175 MG tablet Take 175 mg by mouth daily.   Taking  . Multiple Vitamins-Calcium (ONE-A-DAY WOMENS PO) Take 1 tablet by mouth daily.     Marland Kitchen triamterene-hydrochlorothiazide (MAXZIDE-25) 37.5-25 MG tablet Take 1 tablet by mouth daily. 90 tablet 1 Taking  . valACYclovir (VALTREX) 1000 MG tablet Take 1 tablet (1,000 mg total) by mouth as needed. (Patient taking differently: Take 1,000 mg by mouth daily as needed (for outbreaks). ) 30 tablet 1 Taking   No Known Allergies  Social History   Tobacco Use  . Smoking status: Former Smoker    Packs/day: 0.50    Years: 10.00    Pack years: 5.00  Types: Cigarettes    Last attempt to quit: 03/12/2000    Years since quitting: 17.4  . Smokeless tobacco: Never Used  Substance Use Topics  . Alcohol use: Yes    Alcohol/week: 0.6 oz    Types: 1 Standard drinks or equivalent per week    Comment: daily    Family History  Problem Relation Age of Onset  . Cancer Mother 49       breast cancer  . Heart disease Mother   . Hypertension Mother   . Diabetes Mother   . Breast cancer Mother 58  . Heart disease  Father      Review of Systems  Constitutional: Negative.   HENT: Negative.   Eyes: Negative.   Respiratory: Negative.   Cardiovascular: Negative.   Gastrointestinal: Negative.   Genitourinary: Negative.   Musculoskeletal: Positive for joint pain.  Skin: Negative.   Neurological: Negative.   Endo/Heme/Allergies: Negative.   Psychiatric/Behavioral: Negative.     Objective:  Physical Exam  Constitutional: She is oriented to person, place, and time. She appears well-developed.  HENT:  Head: Normocephalic.  Eyes: EOM are normal.  Neck: Normal range of motion.  Cardiovascular: Normal rate and intact distal pulses.  Respiratory: Effort normal.  GI: Soft.  Genitourinary:  Genitourinary Comments: deferred  Musculoskeletal:  Right knee pain. RLE grossly n/v intact. Knee is stable.  Neurological: She is alert and oriented to person, place, and time.  Skin: Skin is warm and dry.  Psychiatric: Her behavior is normal.    Vital signs in last 24 hours:    Labs:   Estimated body mass index is 31.47 kg/m as calculated from the following:   Height as of 08/13/17: 5\' 8"  (1.727 m).   Weight as of 08/13/17: 93.9 kg (207 lb).   Imaging Review Plain radiographs demonstrate moderate degenerative joint disease of the right knee(s). The overall alignment ismild varus. The bone quality appears to be good for age and reported activity level.   Preoperative templating of the joint replacement has been completed, documented, and submitted to the Operating Room personnel in order to optimize intra-operative equipment management.   Anticipated LOS equal to or greater than 2 midnights due to - Age 78 and older with one or more of the following:  - Obesity  - Expected need for hospital services (PT, OT, Nursing) required for safe  discharge  - Anticipated need for postoperative skilled nursing care or inpatient rehab  - Active co-morbidities: None OR   - Unanticipated findings during/Post  Surgery: None  - Patient is a high risk of re-admission due to: None     Assessment/Plan:  End stage arthritis, right knee   The patient history, physical examination, clinical judgment of the provider and imaging studies are consistent with end stage degenerative joint disease of the right knee(s) and total knee arthroplasty is deemed medically necessary. The treatment options including medical management, injection therapy arthroscopy and arthroplasty were discussed at length. The risks and benefits of total knee arthroplasty were presented and reviewed. The risks due to aseptic loosening, infection, stiffness, patella tracking problems, thromboembolic complications and other imponderables were discussed. The patient acknowledged the explanation, agreed to proceed with the plan and consent was signed. Patient is being admitted for inpatient treatment for surgery, pain control, PT, OT, prophylactic antibiotics, VTE prophylaxis, progressive ambulation and ADL's and discharge planning. The patient is planning to be discharged home with home health services.  Will use IV tranexamic acid. Contraindications and adverse affects of  Tranexamic acid discussed in detail. Patient denies any of these at this time and understands the risks and benefits.

## 2017-08-17 ENCOUNTER — Other Ambulatory Visit: Payer: Self-pay | Admitting: Internal Medicine

## 2017-08-17 ENCOUNTER — Ambulatory Visit: Payer: BC Managed Care – PPO | Admitting: Internal Medicine

## 2017-08-17 ENCOUNTER — Encounter: Payer: Self-pay | Admitting: Internal Medicine

## 2017-08-18 NOTE — Telephone Encounter (Signed)
Patient stated she is fine  and said she does not need an appointment at this time she was just thinking she had an appointment. The up coming knee replacement and going from the cancer center with her husband she just had dates and times confused.

## 2017-08-18 NOTE — Telephone Encounter (Signed)
Please call pt and confirm she is doing ok.  Let her know that I did not realize she came at the wrong time or was even in our office.  I can see her if she needs to be seen.  I can schedule her an appt for 09/14/17 at 12:00, but will also work her in earlier if she needs to be seen.  Just let me know.

## 2017-08-18 NOTE — Telephone Encounter (Signed)
Can we change her appt (physical) with Joycelyn Schmid.  There is no need for Joycelyn Schmid to see her when I can see her for her physical and just tell her to let me know if she needs anything.

## 2017-08-19 MED ORDER — ALPRAZOLAM 0.25 MG PO TABS: 0.2500 mg | ORAL_TABLET | Freq: Every day | ORAL | 0 refills | Status: DC | PRN

## 2017-08-19 NOTE — Telephone Encounter (Signed)
Ok to refill 

## 2017-08-20 NOTE — Telephone Encounter (Signed)
I rescheduled her CPE with you.

## 2017-08-26 MED ORDER — TRANEXAMIC ACID 1000 MG/10ML IV SOLN
1000.0000 mg | INTRAVENOUS | Status: AC
  Administered 2017-08-27: 1000 mg via INTRAVENOUS
  Filled 2017-08-26: qty 1100

## 2017-08-27 ENCOUNTER — Inpatient Hospital Stay (HOSPITAL_COMMUNITY)
Admission: RE | Admit: 2017-08-27 | Discharge: 2017-08-29 | DRG: 470 | Disposition: A | Discharge status: Home Health | Payer: BC Managed Care – PPO | Source: Ambulatory Visit | Attending: Specialist | Admitting: Specialist

## 2017-08-27 ENCOUNTER — Encounter (HOSPITAL_COMMUNITY)
Admission: RE | Disposition: A | Discharge status: Home Health | Payer: Self-pay | Source: Ambulatory Visit | Attending: Specialist

## 2017-08-27 ENCOUNTER — Encounter (HOSPITAL_COMMUNITY): Payer: Self-pay | Admitting: Anesthesiology

## 2017-08-27 ENCOUNTER — Inpatient Hospital Stay (HOSPITAL_COMMUNITY): Payer: BC Managed Care – PPO | Admitting: Anesthesiology

## 2017-08-27 DIAGNOSIS — G4730 Sleep apnea, unspecified: Secondary | ICD-10-CM | POA: Diagnosis present

## 2017-08-27 DIAGNOSIS — Z79899 Other long term (current) drug therapy: Secondary | ICD-10-CM | POA: Diagnosis not present

## 2017-08-27 DIAGNOSIS — Z85820 Personal history of malignant melanoma of skin: Secondary | ICD-10-CM | POA: Diagnosis not present

## 2017-08-27 DIAGNOSIS — E7800 Pure hypercholesterolemia, unspecified: Secondary | ICD-10-CM | POA: Diagnosis present

## 2017-08-27 DIAGNOSIS — F419 Anxiety disorder, unspecified: Secondary | ICD-10-CM | POA: Diagnosis present

## 2017-08-27 DIAGNOSIS — I10 Essential (primary) hypertension: Secondary | ICD-10-CM | POA: Diagnosis present

## 2017-08-27 DIAGNOSIS — E669 Obesity, unspecified: Secondary | ICD-10-CM | POA: Diagnosis present

## 2017-08-27 DIAGNOSIS — Z6831 Body mass index (BMI) 31.0-31.9, adult: Secondary | ICD-10-CM

## 2017-08-27 DIAGNOSIS — Z87891 Personal history of nicotine dependence: Secondary | ICD-10-CM | POA: Diagnosis not present

## 2017-08-27 DIAGNOSIS — M1711 Unilateral primary osteoarthritis, right knee: Secondary | ICD-10-CM | POA: Diagnosis present

## 2017-08-27 DIAGNOSIS — Z96651 Presence of right artificial knee joint: Secondary | ICD-10-CM

## 2017-08-27 DIAGNOSIS — K219 Gastro-esophageal reflux disease without esophagitis: Secondary | ICD-10-CM | POA: Diagnosis present

## 2017-08-27 DIAGNOSIS — Z96659 Presence of unspecified artificial knee joint: Secondary | ICD-10-CM

## 2017-08-27 HISTORY — PX: TOTAL KNEE ARTHROPLASTY: SHX125

## 2017-08-27 LAB — TYPE AND SCREEN
ABO/RH(D): A POS
ANTIBODY SCREEN: NEGATIVE

## 2017-08-27 LAB — ABO/RH: ABO/RH(D): A POS

## 2017-08-27 SURGERY — ARTHROPLASTY, KNEE, TOTAL
Anesthesia: Spinal | Site: Knee | Laterality: Right

## 2017-08-27 MED ORDER — LIDOCAINE 2% (20 MG/ML) 5 ML SYRINGE
INTRAMUSCULAR | Status: AC
  Filled 2017-08-27: qty 5

## 2017-08-27 MED ORDER — ALUM & MAG HYDROXIDE-SIMETH 200-200-20 MG/5ML PO SUSP: 30.0000 mL | ORAL | Status: DC | PRN

## 2017-08-27 MED ORDER — ALPRAZOLAM 0.25 MG PO TABS: 0.2500 mg | ORAL_TABLET | Freq: Every evening | ORAL | Status: DC | PRN

## 2017-08-27 MED ORDER — FENTANYL CITRATE (PF) 100 MCG/2ML IJ SOLN
INTRAMUSCULAR | Status: DC | PRN
  Administered 2017-08-27 (×2): 50 ug via INTRAVENOUS

## 2017-08-27 MED ORDER — BUPIVACAINE-EPINEPHRINE 0.25% -1:200000 IJ SOLN
INTRAMUSCULAR | Status: DC | PRN
  Administered 2017-08-27: 30 mL

## 2017-08-27 MED ORDER — HYDROMORPHONE HCL 1 MG/ML IJ SOLN: 0.5000 mg | INTRAMUSCULAR | Status: DC | PRN

## 2017-08-27 MED ORDER — PROPOFOL 10 MG/ML IV BOLUS
INTRAVENOUS | Status: AC
  Filled 2017-08-27: qty 20

## 2017-08-27 MED ORDER — SODIUM CHLORIDE 0.9 % IJ SOLN
INTRAMUSCULAR | Status: DC | PRN
  Administered 2017-08-27: 30 mL

## 2017-08-27 MED ORDER — METHOCARBAMOL 500 MG PO TABS: 500.0000 mg | ORAL_TABLET | Freq: Three times a day (TID) | ORAL | 1 refills | Status: DC | PRN

## 2017-08-27 MED ORDER — DEXAMETHASONE SODIUM PHOSPHATE 10 MG/ML IJ SOLN
INTRAMUSCULAR | Status: AC
  Filled 2017-08-27: qty 1

## 2017-08-27 MED ORDER — POLYETHYLENE GLYCOL 3350 17 G PO PACK
17.0000 g | PACK | Freq: Every day | ORAL | Status: DC | PRN
  Administered 2017-08-28: 17 g via ORAL
  Filled 2017-08-27: qty 1

## 2017-08-27 MED ORDER — BISACODYL 5 MG PO TBEC: 5.0000 mg | DELAYED_RELEASE_TABLET | Freq: Every day | ORAL | Status: DC | PRN

## 2017-08-27 MED ORDER — METHOCARBAMOL 1000 MG/10ML IJ SOLN
500.0000 mg | Freq: Four times a day (QID) | INTRAVENOUS | Status: DC | PRN
  Administered 2017-08-27: 500 mg via INTRAVENOUS
  Filled 2017-08-27: qty 550

## 2017-08-27 MED ORDER — AMLODIPINE BESYLATE 5 MG PO TABS
5.0000 mg | ORAL_TABLET | Freq: Every day | ORAL | Status: DC
  Administered 2017-08-28 – 2017-08-29 (×2): 5 mg via ORAL
  Filled 2017-08-27 (×2): qty 1

## 2017-08-27 MED ORDER — MENTHOL 3 MG MT LOZG: 1.0000 | LOZENGE | OROMUCOSAL | Status: DC | PRN

## 2017-08-27 MED ORDER — SODIUM CHLORIDE 0.9 % IV SOLN
INTRAVENOUS | Status: DC
  Administered 2017-08-27: 23:00:00 via INTRAVENOUS

## 2017-08-27 MED ORDER — PHENOL 1.4 % MT LIQD
1.0000 | OROMUCOSAL | Status: DC | PRN
  Filled 2017-08-27: qty 177

## 2017-08-27 MED ORDER — OXYCODONE HCL 5 MG PO TABS
5.0000 mg | ORAL_TABLET | ORAL | Status: DC | PRN
  Administered 2017-08-28: 5 mg via ORAL
  Administered 2017-08-28: 10 mg via ORAL
  Administered 2017-08-28: 5 mg via ORAL
  Administered 2017-08-28 – 2017-08-29 (×2): 10 mg via ORAL
  Filled 2017-08-27: qty 2
  Filled 2017-08-27: qty 1
  Filled 2017-08-27 (×4): qty 2

## 2017-08-27 MED ORDER — KETOROLAC TROMETHAMINE 30 MG/ML IJ SOLN
INTRAMUSCULAR | Status: DC | PRN
  Administered 2017-08-27: 30 mg via INTRA_ARTICULAR

## 2017-08-27 MED ORDER — LIDOCAINE 2% (20 MG/ML) 5 ML SYRINGE
INTRAMUSCULAR | Status: DC | PRN
  Administered 2017-08-27: 100 mg via INTRAVENOUS

## 2017-08-27 MED ORDER — DEXAMETHASONE SODIUM PHOSPHATE 10 MG/ML IJ SOLN
10.0000 mg | Freq: Once | INTRAMUSCULAR | Status: AC
  Administered 2017-08-27: 10 mg via INTRAVENOUS

## 2017-08-27 MED ORDER — METHOCARBAMOL 500 MG PO TABS
500.0000 mg | ORAL_TABLET | Freq: Four times a day (QID) | ORAL | Status: DC | PRN
  Administered 2017-08-28 – 2017-08-29 (×4): 500 mg via ORAL
  Filled 2017-08-27 (×4): qty 1

## 2017-08-27 MED ORDER — HYDROMORPHONE HCL 1 MG/ML IJ SOLN
0.2500 mg | INTRAMUSCULAR | Status: DC | PRN
  Administered 2017-08-27 (×4): 0.5 mg via INTRAVENOUS

## 2017-08-27 MED ORDER — LACTATED RINGERS IV SOLN
INTRAVENOUS | Status: DC
  Administered 2017-08-27 (×3): via INTRAVENOUS

## 2017-08-27 MED ORDER — CHLORHEXIDINE GLUCONATE 4 % EX LIQD
60.0000 mL | Freq: Once | CUTANEOUS | Status: AC
  Administered 2017-08-27: 4 via TOPICAL

## 2017-08-27 MED ORDER — HYDROMORPHONE HCL 1 MG/ML IJ SOLN
INTRAMUSCULAR | Status: AC
  Filled 2017-08-27: qty 2

## 2017-08-27 MED ORDER — ACETAMINOPHEN 500 MG PO TABS
1000.0000 mg | ORAL_TABLET | Freq: Four times a day (QID) | ORAL | Status: AC
  Administered 2017-08-27 – 2017-08-28 (×4): 1000 mg via ORAL
  Filled 2017-08-27 (×4): qty 2

## 2017-08-27 MED ORDER — DEXAMETHASONE SODIUM PHOSPHATE 10 MG/ML IJ SOLN
10.0000 mg | Freq: Once | INTRAMUSCULAR | Status: AC
  Administered 2017-08-28: 10 mg via INTRAVENOUS
  Filled 2017-08-27: qty 1

## 2017-08-27 MED ORDER — LACTATED RINGERS IV SOLN: INTRAVENOUS | Status: DC

## 2017-08-27 MED ORDER — TRIAMTERENE-HCTZ 37.5-25 MG PO TABS
1.0000 | ORAL_TABLET | Freq: Every day | ORAL | Status: DC
  Administered 2017-08-28 – 2017-08-29 (×2): 1 via ORAL
  Filled 2017-08-27 (×2): qty 1

## 2017-08-27 MED ORDER — ACETAMINOPHEN 325 MG PO TABS: 325.0000 mg | ORAL_TABLET | Freq: Four times a day (QID) | ORAL | Status: DC | PRN

## 2017-08-27 MED ORDER — FENTANYL CITRATE (PF) 100 MCG/2ML IJ SOLN
100.0000 ug | Freq: Once | INTRAMUSCULAR | Status: AC
  Administered 2017-08-27: 100 ug via INTRAVENOUS
  Filled 2017-08-27: qty 2

## 2017-08-27 MED ORDER — MIDAZOLAM HCL 2 MG/2ML IJ SOLN
INTRAMUSCULAR | Status: AC
  Filled 2017-08-27: qty 2

## 2017-08-27 MED ORDER — OXYCODONE HCL 5 MG PO TABS
10.0000 mg | ORAL_TABLET | ORAL | Status: DC | PRN
  Administered 2017-08-27 – 2017-08-29 (×6): 10 mg via ORAL
  Filled 2017-08-27 (×5): qty 2

## 2017-08-27 MED ORDER — PROPOFOL 500 MG/50ML IV EMUL
INTRAVENOUS | Status: DC | PRN
  Administered 2017-08-27: 100 ug/kg/min via INTRAVENOUS

## 2017-08-27 MED ORDER — SODIUM CHLORIDE 0.9 % IJ SOLN
INTRAMUSCULAR | Status: AC
  Filled 2017-08-27: qty 50

## 2017-08-27 MED ORDER — MIDAZOLAM HCL 2 MG/2ML IJ SOLN
2.0000 mg | Freq: Once | INTRAMUSCULAR | Status: AC
  Administered 2017-08-27: 2 mg via INTRAVENOUS
  Filled 2017-08-27: qty 2

## 2017-08-27 MED ORDER — BUPIVACAINE IN DEXTROSE 0.75-8.25 % IT SOLN
INTRATHECAL | Status: DC | PRN
  Administered 2017-08-27: 1.8 mL via INTRATHECAL

## 2017-08-27 MED ORDER — SODIUM CHLORIDE 0.9 % IR SOLN
Status: DC | PRN
  Administered 2017-08-27: 1000 mL

## 2017-08-27 MED ORDER — OXYCODONE HCL 5 MG PO TABS: 5.0000 mg | ORAL_TABLET | ORAL | 0 refills | Status: AC | PRN

## 2017-08-27 MED ORDER — ENALAPRIL MALEATE 10 MG PO TABS
10.0000 mg | ORAL_TABLET | Freq: Two times a day (BID) | ORAL | Status: DC
  Administered 2017-08-28 – 2017-08-29 (×2): 10 mg via ORAL
  Filled 2017-08-27 (×3): qty 1

## 2017-08-27 MED ORDER — MIDAZOLAM HCL 5 MG/5ML IJ SOLN
INTRAMUSCULAR | Status: DC | PRN
  Administered 2017-08-27: 2 mg via INTRAVENOUS

## 2017-08-27 MED ORDER — KETOROLAC TROMETHAMINE 30 MG/ML IJ SOLN
INTRAMUSCULAR | Status: AC
  Filled 2017-08-27: qty 1

## 2017-08-27 MED ORDER — DIPHENHYDRAMINE HCL 12.5 MG/5ML PO ELIX: 12.5000 mg | ORAL_SOLUTION | ORAL | Status: DC | PRN

## 2017-08-27 MED ORDER — BUPIVACAINE-EPINEPHRINE (PF) 0.25% -1:200000 IJ SOLN
INTRAMUSCULAR | Status: AC
  Filled 2017-08-27: qty 30

## 2017-08-27 MED ORDER — PROPOFOL 10 MG/ML IV BOLUS
INTRAVENOUS | Status: AC
  Filled 2017-08-27: qty 40

## 2017-08-27 MED ORDER — ONDANSETRON HCL 4 MG/2ML IJ SOLN
INTRAMUSCULAR | Status: AC
  Filled 2017-08-27: qty 2

## 2017-08-27 MED ORDER — FLUOXETINE HCL 20 MG PO CAPS
40.0000 mg | ORAL_CAPSULE | Freq: Every day | ORAL | Status: DC
  Administered 2017-08-28 – 2017-08-29 (×2): 40 mg via ORAL
  Filled 2017-08-27 (×3): qty 2

## 2017-08-27 MED ORDER — ONDANSETRON HCL 4 MG PO TABS: 4.0000 mg | ORAL_TABLET | Freq: Four times a day (QID) | ORAL | Status: DC | PRN

## 2017-08-27 MED ORDER — CEFAZOLIN SODIUM-DEXTROSE 2-4 GM/100ML-% IV SOLN
2.0000 g | INTRAVENOUS | Status: AC
  Administered 2017-08-27: 2 g via INTRAVENOUS
  Filled 2017-08-27: qty 100

## 2017-08-27 MED ORDER — ONDANSETRON HCL 4 MG/2ML IJ SOLN
INTRAMUSCULAR | Status: DC | PRN
  Administered 2017-08-27: 4 mg via INTRAVENOUS

## 2017-08-27 MED ORDER — MAGNESIUM CITRATE PO SOLN: 1.0000 | Freq: Once | ORAL | Status: DC | PRN

## 2017-08-27 MED ORDER — FERROUS SULFATE 325 (65 FE) MG PO TABS
325.0000 mg | ORAL_TABLET | Freq: Three times a day (TID) | ORAL | Status: DC
  Administered 2017-08-28 – 2017-08-29 (×6): 325 mg via ORAL
  Filled 2017-08-27 (×5): qty 1

## 2017-08-27 MED ORDER — METOCLOPRAMIDE HCL 5 MG/ML IJ SOLN: 5.0000 mg | Freq: Three times a day (TID) | INTRAMUSCULAR | Status: DC | PRN

## 2017-08-27 MED ORDER — STERILE WATER FOR IRRIGATION IR SOLN
Status: DC | PRN
  Administered 2017-08-27: 2000 mL

## 2017-08-27 MED ORDER — METOCLOPRAMIDE HCL 5 MG PO TABS: 5.0000 mg | ORAL_TABLET | Freq: Three times a day (TID) | ORAL | Status: DC | PRN

## 2017-08-27 MED ORDER — MEPERIDINE HCL 50 MG/ML IJ SOLN: 6.2500 mg | INTRAMUSCULAR | Status: DC | PRN

## 2017-08-27 MED ORDER — ROPIVACAINE HCL 7.5 MG/ML IJ SOLN
INTRAMUSCULAR | Status: DC | PRN
  Administered 2017-08-27: 20 mL via PERINEURAL

## 2017-08-27 MED ORDER — CEFAZOLIN SODIUM-DEXTROSE 2-4 GM/100ML-% IV SOLN
2.0000 g | Freq: Four times a day (QID) | INTRAVENOUS | Status: AC
  Administered 2017-08-27 – 2017-08-28 (×2): 2 g via INTRAVENOUS
  Filled 2017-08-27 (×2): qty 100

## 2017-08-27 MED ORDER — DOCUSATE SODIUM 100 MG PO CAPS
100.0000 mg | ORAL_CAPSULE | Freq: Two times a day (BID) | ORAL | Status: DC
  Administered 2017-08-27 – 2017-08-29 (×4): 100 mg via ORAL
  Filled 2017-08-27 (×4): qty 1

## 2017-08-27 MED ORDER — FENTANYL CITRATE (PF) 100 MCG/2ML IJ SOLN
INTRAMUSCULAR | Status: AC
  Filled 2017-08-27: qty 2

## 2017-08-27 MED ORDER — ENOXAPARIN SODIUM 30 MG/0.3ML ~~LOC~~ SOLN
30.0000 mg | Freq: Two times a day (BID) | SUBCUTANEOUS | Status: DC
  Administered 2017-08-28 – 2017-08-29 (×3): 30 mg via SUBCUTANEOUS
  Filled 2017-08-27 (×3): qty 0.3

## 2017-08-27 MED ORDER — ASPIRIN EC 325 MG PO TBEC: 325.0000 mg | DELAYED_RELEASE_TABLET | Freq: Two times a day (BID) | ORAL | 0 refills | Status: DC

## 2017-08-27 MED ORDER — 0.9 % SODIUM CHLORIDE (POUR BTL) OPTIME
TOPICAL | Status: DC | PRN
  Administered 2017-08-27: 1000 mL

## 2017-08-27 MED ORDER — PROMETHAZINE HCL 25 MG/ML IJ SOLN: 6.2500 mg | INTRAMUSCULAR | Status: DC | PRN

## 2017-08-27 MED ORDER — ONDANSETRON HCL 4 MG/2ML IJ SOLN: 4.0000 mg | Freq: Four times a day (QID) | INTRAMUSCULAR | Status: DC | PRN

## 2017-08-27 SURGICAL SUPPLY — 55 items
BAG DECANTER FOR FLEXI CONT (MISCELLANEOUS) IMPLANT
BAG ZIPLOCK 12X15 (MISCELLANEOUS) IMPLANT
BANDAGE ACE 4X5 VEL STRL LF (GAUZE/BANDAGES/DRESSINGS) ×3 IMPLANT
BANDAGE ACE 6X5 VEL STRL LF (GAUZE/BANDAGES/DRESSINGS) ×3 IMPLANT
BLADE SAG 18X100X1.27 (BLADE) ×3 IMPLANT
BLADE SAW SGTL 13.0X1.19X90.0M (BLADE) ×3 IMPLANT
BOWL SMART MIX CTS (DISPOSABLE) ×3 IMPLANT
CAP KNEE TOTAL 3 SIGMA ×3 IMPLANT
CEMENT HV SMART SET (Cement) ×6 IMPLANT
COVER SURGICAL LIGHT HANDLE (MISCELLANEOUS) ×3 IMPLANT
CUFF TOURN SGL QUICK 34 (TOURNIQUET CUFF) ×2
CUFF TRNQT CYL 34X4X40X1 (TOURNIQUET CUFF) ×1 IMPLANT
DECANTER SPIKE VIAL GLASS SM (MISCELLANEOUS) ×3 IMPLANT
DERMABOND ADVANCED (GAUZE/BANDAGES/DRESSINGS) ×2
DERMABOND ADVANCED .7 DNX12 (GAUZE/BANDAGES/DRESSINGS) ×1 IMPLANT
DRAPE U-SHAPE 47X51 STRL (DRAPES) ×3 IMPLANT
DRSG AQUACEL AG ADV 3.5X10 (GAUZE/BANDAGES/DRESSINGS) ×3 IMPLANT
DRSG TEGADERM 4X4.75 (GAUZE/BANDAGES/DRESSINGS) ×3 IMPLANT
DURAPREP 26ML APPLICATOR (WOUND CARE) ×6 IMPLANT
ELECT REM PT RETURN 15FT ADLT (MISCELLANEOUS) ×3 IMPLANT
EVACUATOR 1/8 PVC DRAIN (DRAIN) ×3 IMPLANT
GAUZE SPONGE 2X2 8PLY STRL LF (GAUZE/BANDAGES/DRESSINGS) ×1 IMPLANT
GLOVE BIO SURGEON STRL SZ7.5 (GLOVE) ×6 IMPLANT
GLOVE BIOGEL PI IND STRL 8 (GLOVE) ×2 IMPLANT
GLOVE BIOGEL PI INDICATOR 8 (GLOVE) ×4
GLOVE ECLIPSE 8.0 STRL XLNG CF (GLOVE) ×6 IMPLANT
GLOVE SURG ORTHO 9.0 STRL STRW (GLOVE) ×3 IMPLANT
GOWN STRL REUS W/TWL XL LVL3 (GOWN DISPOSABLE) ×6 IMPLANT
HANDPIECE INTERPULSE COAX TIP (DISPOSABLE) ×2
HOLDER FOLEY CATH W/STRAP (MISCELLANEOUS) IMPLANT
IMMOBILIZER KNEE 20 (SOFTGOODS) ×3
IMMOBILIZER KNEE 20 THIGH 36 (SOFTGOODS) ×1 IMPLANT
NS IRRIG 1000ML POUR BTL (IV SOLUTION) ×3 IMPLANT
PACK TOTAL KNEE CUSTOM (KITS) ×3 IMPLANT
POSITIONER SURGICAL ARM (MISCELLANEOUS) ×3 IMPLANT
SET HNDPC FAN SPRY TIP SCT (DISPOSABLE) ×1 IMPLANT
SET PAD KNEE POSITIONER (MISCELLANEOUS) ×3 IMPLANT
SPONGE GAUZE 2X2 STER 10/PKG (GAUZE/BANDAGES/DRESSINGS) ×2
SPONGE LAP 18X18 RF (DISPOSABLE) IMPLANT
SPONGE SURGIFOAM ABS GEL 100 (HEMOSTASIS) ×6 IMPLANT
STOCKINETTE 6  STRL (DRAPES) ×2
STOCKINETTE 6 STRL (DRAPES) ×1 IMPLANT
SUT BONE WAX W31G (SUTURE) IMPLANT
SUT MNCRL AB 3-0 PS2 18 (SUTURE) ×3 IMPLANT
SUT VIC AB 1 CT1 27 (SUTURE) ×8
SUT VIC AB 1 CT1 27XBRD ANTBC (SUTURE) ×4 IMPLANT
SUT VIC AB 2-0 CT1 27 (SUTURE) ×4
SUT VIC AB 2-0 CT1 TAPERPNT 27 (SUTURE) ×2 IMPLANT
SUT VLOC 180 0 24IN GS25 (SUTURE) ×3 IMPLANT
SYR 50ML LL SCALE MARK (SYRINGE) ×3 IMPLANT
TAPE STRIPS DRAPE STRL (GAUZE/BANDAGES/DRESSINGS) ×3 IMPLANT
TRAY FOLEY MTR SLVR 16FR STAT (SET/KITS/TRAYS/PACK) ×3 IMPLANT
WATER STERILE IRR 1000ML POUR (IV SOLUTION) ×6 IMPLANT
WRAP KNEE MAXI GEL POST OP (GAUZE/BANDAGES/DRESSINGS) ×3 IMPLANT
YANKAUER SUCT BULB TIP 10FT TU (MISCELLANEOUS) ×3 IMPLANT

## 2017-08-27 NOTE — Transfer of Care (Signed)
Immediate Anesthesia Transfer of Care Note  Patient: Jordan Benson  Procedure(s) Performed: RIGHT TOTAL KNEE ARTHROPLASTY (Right Knee)  Patient Location: PACU  Anesthesia Type:MAC  Level of Consciousness: awake, alert , oriented and patient cooperative  Airway & Oxygen Therapy: Patient Spontanous Breathing and Patient connected to face mask oxygen  Post-op Assessment: Report given to RN and Post -op Vital signs reviewed and stable  Post vital signs: stable  Last Vitals:  Vitals Value Taken Time  BP 149/92 08/27/2017  6:30 PM  Temp    Pulse 85 08/27/2017  6:31 PM  Resp 16 08/27/2017  6:31 PM  SpO2 98 % 08/27/2017  6:31 PM  Vitals shown include unvalidated device data.  Last Pain:  Vitals:   08/27/17 1815  TempSrc:   PainSc: 10-Worst pain ever         Complications: No apparent anesthesia complications

## 2017-08-27 NOTE — Op Note (Signed)
DATE OF SURGERY:  08/27/2017  TIME: 5:46 PM  PATIENT NAME:  Jordan Benson    AGE: 62 y.o.   PRE-OPERATIVE DIAGNOSIS:  Right knee osteoarthritis  POST-OPERATIVE DIAGNOSIS:  Right knee osteoarthritis  PROCEDURE:  Procedure(s): RIGHT TOTAL KNEE ARTHROPLASTY  SURGEON:  Karee Christopherson ANDREW  ASSISTANT:  Bryson Stilwell, PA-C, present and scrubbed throughout the case, critical for assistance with exposure, retraction, instrumentation, and closure.  OPERATIVE IMPLANTS: Depuy PFC Sigma Rotating Platform.  Femur size 4, Tibia size 4, Patella size 35 3-peg oval button, with a 10 mm polyethylene insert.   PREOPERATIVE INDICATIONS:   Jordan Benson is a 61 y.o. year old female with end stage bone on bone arthritis of the knee who failed conservative treatment and elected for Total Knee Arthroplasty.   The risks, benefits, and alternatives were discussed at length including but not limited to the risks of infection, bleeding, nerve injury, stiffness, blood clots, the need for revision surgery, cardiopulmonary complications, among others, and they were willing to proceed.  OPERATIVE DESCRIPTION:  The patient was brought to the operative room and placed in a supine position.  Spinal anesthesia was administered.  IV antibiotics were given.  The lower extremity was prepped and draped in the usual sterile fashion.  Time out was performed.  The leg was elevated and exsanguinated and the tourniquet was inflated.  Anterior quadriceps tendon splitting approach was performed.  The patella was retracted and osteophytes were removed.  The anterior horn of the medial and lateral meniscus was removed and cruciate ligaments resected.   The distal femur was opened with the drill and the intramedullary distal femoral cutting jig was utilized, set at 5 degrees resecting 10 mm off the distal femur.  Care was taken to protect the collateral ligaments.  The distal femoral sizing jig was applied, taking  care to avoid notching.  Then the 4-in-1 cutting jig was applied and the anterior and posterior femur was cut, along with the chamfer cuts.    Then the extramedullary tibial cutting jig was utilized making the appropriate cut using the anterior tibial crest as a reference building in appropriate posterior slope.  Care was taken during the cut to protect the medial and collateral ligaments.  The proximal tibia was removed along with the posterior horns of the menisci.   The posterior medial femoral osteophytes and posterior lateral femoral osteophytes were removed.    The flexion gap was then measured and was symmetric with the extension gap, measured at 10.  I completed the distal femoral preparation using the appropriate jig to prepare the box.  The patella was then measured, and cut with the saw.    The proximal tibia sized and prepared accordingly with the reamer and the punch, and then all components were trialed with the trial insert.  The knee was found to have excellent balance and full motion.    The above named components were then cemented into place and all excess cement was removed.  The trial polyethylene component was in place during cementation, and then was exchanged for the real polyethylene component.    The knee was easily taken through a range of motion and the patella tracked well and the knee irrigated copiously and the parapatellar and subcutaneous tissue closed with vicryl, and monocryl with steri strips for the skin.  The arthrotomy was closed at 90 of flexion. The wounds were dressed with sterile gauze and the tourniquet released and the patient was awakened and returned to the PACU  in stable and satisfactory condition.  There were no complications.  Total tourniquet time was 70 minutes.

## 2017-08-27 NOTE — Anesthesia Procedure Notes (Signed)
Anesthesia Regional Block: Adductor canal block   Pre-Anesthetic Checklist: ,, timeout performed, Correct Patient, Correct Site, Correct Laterality, Correct Procedure, Correct Position, site marked, Risks and benefits discussed,  Surgical consent,  Pre-op evaluation,  At surgeon's request and post-op pain management  Laterality: Right  Prep: chloraprep       Needles:  Injection technique: Single-shot  Needle Type: Echogenic Needle     Needle Length: 9cm  Needle Gauge: 21     Additional Needles:   Procedures:,,,, ultrasound used (permanent image in chart),,,,  Narrative:  Start time: 08/27/2017 2:10 PM End time: 08/27/2017 2:20 PM Injection made incrementally with aspirations every 5 mL.  Performed by: Personally  Anesthesiologist: [name], MD  Additional Notes: Patient tolerated the procedure well. Local anesthetic introduced in an incremental fashion under minimal resistance after negative aspirations. No paresthesias were elicited. After completion of the procedure, no acute issues were identified and patient continued to be monitored by RN.

## 2017-08-27 NOTE — Anesthesia Procedure Notes (Signed)
Procedure Name: MAC Date/Time: 08/27/2017 4:05 PM Performed by: Jarielys Girardot D, CRNA Pre-anesthesia Checklist: Patient identified, Emergency Drugs available, Suction available, Patient being monitored and Timeout performed Patient Re-evaluated:Patient Re-evaluated prior to induction Oxygen Delivery Method: Simple face mask Placement Confirmation: positive ETCO2

## 2017-08-27 NOTE — Anesthesia Preprocedure Evaluation (Addendum)
Anesthesia Evaluation  Patient identified by MRN, date of birth, ID band Patient awake    Reviewed: Allergy & Precautions, NPO status , Patient's Chart, lab work & pertinent test results  Airway Mallampati: II  TM Distance: >3 FB Neck ROM: Full    Dental  (+) Teeth Intact, Dental Advisory Given   Pulmonary sleep apnea , former smoker,    breath sounds clear to auscultation       Cardiovascular hypertension, Pt. on medications  Rhythm:Regular Rate:Normal     Neuro/Psych Anxiety    GI/Hepatic Neg liver ROS, GERD  ,  Endo/Other  negative endocrine ROS  Renal/GU negative Renal ROS     Musculoskeletal negative musculoskeletal ROS (+)   Abdominal (+) + obese,   Peds  Hematology negative hematology ROS (+)   Anesthesia Other Findings - IBS  Reproductive/Obstetrics                            Anesthesia Physical Anesthesia Plan  ASA: II  Anesthesia Plan: Spinal   Post-op Pain Management:  Regional for Post-op pain   Induction: Intravenous  PONV Risk Score and Plan: 3 and Ondansetron, Dexamethasone, Midazolam and Propofol infusion  Airway Management Planned: Simple Face Mask  Additional Equipment: None  Intra-op Plan:   Post-operative Plan: Extubation in OR  Informed Consent: I have reviewed the patients History and Physical, chart, labs and discussed the procedure including the risks, benefits and alternatives for the proposed anesthesia with the patient or authorized representative who has indicated his/her understanding and acceptance.   Dental advisory given  Plan Discussed with: CRNA  Anesthesia Plan Comments:        Lab Results  Component Value Date   WBC 8.5 08/13/2017   HGB 14.5 08/13/2017   HCT 42.5 08/13/2017   MCV 92.2 08/13/2017   PLT 334 08/13/2017   Lab Results  Component Value Date   CREATININE 0.92 08/13/2017   BUN 20 08/13/2017   NA 138 08/13/2017   K 4.6 08/13/2017   CL 100 08/13/2017   CO2 28 08/13/2017   Lab Results  Component Value Date   INR 0.94 08/13/2017   EKG: normal sinus rhythm.  Anesthesia Quick Evaluation

## 2017-08-27 NOTE — Progress Notes (Signed)
Assisted Dr. Hollis with right, ultrasound guided, adductor canal block. Side rails up, monitors on throughout procedure. See vital signs in flow sheet. Tolerated Procedure well.  

## 2017-08-27 NOTE — Interval H&P Note (Signed)
History and Physical Interval Note:  08/27/2017 1:35 PM  Jordan Benson  has presented today for surgery, with the diagnosis of Right knee osteoarthritis  The various methods of treatment have been discussed with the patient and family. After consideration of risks, benefits and other options for treatment, the patient has consented to  Procedure(s) with comments: RIGHT TOTAL KNEE ARTHROPLASTY (Right) - 2 hrs as a surgical intervention .  The patient's history has been reviewed, patient examined, no change in status, stable for surgery.  I have reviewed the patient's chart and labs.  Questions were answered to the patient's satisfaction.     Dezaray Shibuya ANDREW

## 2017-08-27 NOTE — Anesthesia Procedure Notes (Signed)
Spinal  Patient location during procedure: OR End time: 08/27/2017 4:10 PM Staffing Resident/CRNA: Noralyn Pick D, CRNA Performed: anesthesiologist and resident/CRNA  Preanesthetic Checklist Completed: patient identified, site marked, surgical consent, pre-op evaluation, timeout performed, IV checked, risks and benefits discussed and monitors and equipment checked Spinal Block Patient position: sitting Prep: Betadine Patient monitoring: heart rate, continuous pulse ox and blood pressure Approach: midline Location: L3-4 Injection technique: single-shot Needle Needle type: Sprotte  Needle gauge: 24 G Needle length: 9 cm Assessment Sensory level: T6 Additional Notes Expiration date of kit checked and confirmed. Patient tolerated procedure well, without complications.

## 2017-08-28 ENCOUNTER — Other Ambulatory Visit: Payer: Self-pay

## 2017-08-28 LAB — CBC
HEMATOCRIT: 37.1 % (ref 36.0–46.0)
Hemoglobin: 12.7 g/dL (ref 12.0–15.0)
MCH: 31.4 pg (ref 26.0–34.0)
MCHC: 34.2 g/dL (ref 30.0–36.0)
MCV: 91.8 fL (ref 78.0–100.0)
Platelets: 294 10*3/uL (ref 150–400)
RBC: 4.04 MIL/uL (ref 3.87–5.11)
RDW: 11.8 % (ref 11.5–15.5)
WBC: 8.2 10*3/uL (ref 4.0–10.5)

## 2017-08-28 LAB — BASIC METABOLIC PANEL
ANION GAP: 9 (ref 5–15)
BUN: 16 mg/dL (ref 8–23)
CO2: 27 mmol/L (ref 22–32)
Calcium: 8.7 mg/dL — ABNORMAL LOW (ref 8.9–10.3)
Chloride: 104 mmol/L (ref 98–111)
Creatinine, Ser: 0.71 mg/dL (ref 0.44–1.00)
Glucose, Bld: 136 mg/dL — ABNORMAL HIGH (ref 70–99)
Potassium: 4 mmol/L (ref 3.5–5.1)
Sodium: 140 mmol/L (ref 135–145)

## 2017-08-28 NOTE — Anesthesia Postprocedure Evaluation (Signed)
Anesthesia Post Note  Patient: Jordan Benson  Procedure(s) Performed: RIGHT TOTAL KNEE ARTHROPLASTY (Right Knee)     Patient location during evaluation: PACU Anesthesia Type: Spinal Level of consciousness: awake and alert Pain management: pain level controlled Vital Signs Assessment: post-procedure vital signs reviewed and stable Respiratory status: spontaneous breathing, nonlabored ventilation, respiratory function stable and patient connected to nasal cannula oxygen Cardiovascular status: stable and blood pressure returned to baseline Postop Assessment: no apparent nausea or vomiting Anesthetic complications: no    Last Vitals:  Vitals:   08/28/17 1341 08/28/17 1910  BP: 131/68 130/69  Pulse: 75 68  Resp: 16 16  Temp: 36.6 C 36.8 C  SpO2: 98% 97%    Last Pain:  Vitals:   08/28/17 1910  TempSrc: Oral  PainSc:                  [name]

## 2017-08-28 NOTE — Progress Notes (Signed)
Physical Therapy Treatment Patient Details Name: Jordan Benson MRN: [id number] DOB: 07-20-1955 Today's Date: 08/28/2017    History of Present Illness pt s/p R TKA 08/27/2017 , hx of IBS and knee scope.     PT Comments    Pt tolerated session this afternoon well. Handout given for HEP and reviewed with pt and family.    Follow Up Recommendations  Home health PT;Follow surgeon's recommendation for DC plan and follow-up therapies     Equipment Recommendations  None recommended by PT    Recommendations for Other Services       Precautions / Restrictions Precautions Precautions: Knee Required Braces or Orthoses: Knee Immobilizer - Right Knee Immobilizer - Right: On when out of bed or walking;Discontinue once straight leg raise with < 10 degree lag Restrictions Weight Bearing Restrictions: No    Mobility  Bed Mobility Overal bed mobility: Needs Assistance Bed Mobility: Supine to Sit;Sit to Supine     Supine to sit: Min assist Sit to supine: Min assist   General bed mobility comments: with Le and cues for sequencing   Transfers Overall transfer level: Needs assistance Equipment used: Rolling walker (2 wheeled) Transfers: Sit to/from Stand Sit to Stand: Min assist         General transfer comment: cues for safety to RW and sequencing   Ambulation/Gait Ambulation/Gait assistance: Min guard Gait Distance (Feet): 90 Feet Assistive device: Rolling walker (2 wheeled) Gait Pattern/deviations: Step-through pattern     General Gait Details: began step through pattern thsi afternoon . tolerated well    Stairs             Wheelchair Mobility    Modified Rankin (Stroke Patients Only)       Balance Overall balance assessment: No apparent balance deficits (not formally assessed)                                          Cognition Arousal/Alertness: Awake/alert Behavior During Therapy: WFL for tasks assessed/performed Overall  Cognitive Status: Within Functional Limits for tasks assessed                                        Exercises Total Joint Exercises Ankle Circles/Pumps: AROM;Both;10 reps;Supine Quad Sets: AROM;10 reps;Both;Supine Short Arc QuadSinclair Ship;Right;Supine;10 reps Heel Slides: AAROM;Right;10 reps;Supine Straight Leg Raises: AAROM;10 reps;Right;Supine Goniometric ROM: 0-70 limited by bandage pulling on skin     General Comments        Pertinent Vitals/Pain Pain Score: 1  Pain Location: R knee  Pain Descriptors / Indicators: Dull;Aching    Home Living                      Prior Function            PT Goals (current goals can now be found in the care plan section) Acute Rehab PT Goals Patient Stated Goal: I want to get better soon!!! I can't wait  PT Goal Formulation: With patient Time For Goal Achievement: 09/11/17 Potential to Achieve Goals: Good    Frequency    7X/week      PT Plan      Co-evaluation              AM-PAC PT "6 Clicks" Daily Activity  Outcome Measure  Difficulty turning over in bed (including adjusting bedclothes, sheets and blankets)?: Unable Difficulty moving from lying on back to sitting on the side of the bed? : Unable Difficulty sitting down on and standing up from a chair with arms (e.g., wheelchair, bedside commode, etc,.)?: Unable Help needed moving to and from a bed to chair (including a wheelchair)?: A Little Help needed walking in hospital room?: A Little Help needed climbing 3-5 steps with a railing? : A Little 6 Click Score: 12    End of Session Equipment Utilized During Treatment: Gait belt Activity Tolerance: Patient tolerated treatment well Patient left: with call bell/phone within reach;with chair alarm set;in bed;with family/visitor present Nurse Communication: Mobility status PT Visit Diagnosis: Other abnormalities of gait and mobility (R26.89)     Time: 1400-1445 PT Time Calculation (min)  (ACUTE ONLY): 45 min  Charges:  $Gait Training: 8-22 mins $Therapeutic Exercise: 23-37 mins                    G Codes:       Clide Dales, PT Pager: [phone] 08/28/2017    Jakaya Jacobowitz, Gatha Mayer 08/28/2017, 5:44 PM

## 2017-08-28 NOTE — Care Management Note (Signed)
Case Management Note  Patient Details  Name: Jordan Benson MRN: [id number] Date of Birth: 1955/07/05  Subjective/Objective:     S/p R TKA               Action/Plan: NCM spoke to pt and offered choice for [facility]. Pt agreeable to [facility]. (preoperatively arranged by surgeon's office). Requested RW for home. Contacted [facility] for RW. She has elevated toilet at home.  Expected Discharge Date:  08/30/17               Expected Discharge Plan:  [place]  In-House Referral:  NA  Discharge planning Services  CM Consult  Post Acute Care Choice:  Home Health Choice offered to:  Patient  DME Arranged:  Walker rolling DME Agency:  [place]:  PT [place] Agency:  [facility] ([facility])  Status of Service:  Completed, signed off  If discussed at [employer] of Stay Meetings, dates discussed:    Additional Comments:  Erenest Rasher, RN 08/28/2017, 10:36 AM

## 2017-08-28 NOTE — Progress Notes (Signed)
Patient ID: Jordan Benson, female   DOB: May 18, 1955, 62 y.o.   MRN: [id number] Subjective: 1 Day Post-Op Procedure(s) (LRB): RIGHT TOTAL KNEE ARTHROPLASTY (Right) Patient reports pain as mild.    Patient has complaints of R knee pain.  We will start therapy today. Plan is to go home after hospital stay.  Objective: Vital signs in last 24 hours: Temp:  [97.5 F (36.4 C)-98.4 F (36.9 C)] 97.8 F (36.6 C) (07/20 0938) Pulse Rate:  [68-91] 73 (07/20 0938) Resp:  [8-19] 16 (07/20 0938) BP: (116-151)/(73-92) 116/74 (07/20 0938) SpO2:  [94 %-100 %] 96 % (07/20 0938) Weight:  [93.9 kg (207 lb)] 93.9 kg (207 lb) (07/19 1402)  Intake/Output from previous day:  Intake/Output Summary (Last 24 hours) at 08/28/2017 0958 Last data filed at 08/28/2017 0830 Gross per 24 hour  Intake 3645 ml  Output 1965 ml  Net 1680 ml    Intake/Output this shift: Total I/O In: 240 [P.O.:240] Out: -   Labs: Results for orders placed or performed during the hospital encounter of 08/27/17  CBC  Result Value Ref Range   WBC 8.2 4.0 - 10.5 K/uL   RBC 4.04 3.87 - 5.11 MIL/uL   Hemoglobin 12.7 12.0 - 15.0 g/dL   HCT 37.1 36.0 - 46.0 %   MCV 91.8 78.0 - 100.0 fL   MCH 31.4 26.0 - 34.0 pg   MCHC 34.2 30.0 - 36.0 g/dL   RDW 11.8 11.5 - 15.5 %   Platelets 294 150 - 400 K/uL  Basic metabolic panel  Result Value Ref Range   Sodium 140 135 - 145 mmol/L   Potassium 4.0 3.5 - 5.1 mmol/L   Chloride 104 98 - 111 mmol/L   CO2 27 22 - 32 mmol/L   Glucose, Bld 136 (H) 70 - 99 mg/dL   BUN 16 8 - 23 mg/dL   Creatinine, Ser 0.71 0.44 - 1.00 mg/dL   Calcium 8.7 (L) 8.9 - 10.3 mg/dL   GFR calc non Af Amer >60 >60 mL/min   GFR calc Af Amer >60 >60 mL/min   Anion gap 9 5 - 15  Type and screen [place]  Result Value Ref Range   ABO/RH(D) A POS    Antibody Screen NEG    Sample Expiration      08/30/2017 Performed at [facility], [place] [address], [place], [place]  [address]   ABO/Rh  Result Value Ref Range   ABO/RH(D)      A POS Performed at [facility], [place] [address], [place], [place] [address]     Exam - Neurologically intact ABD soft Neurovascular intact Sensation intact distally Intact pulses distally Dorsiflexion/Plantar flexion intact Incision: dressing C/D/I No cellulitis present Compartment soft no calf pain or sign of DVT Dressing - aquacel clean and dry. Drain site dressing saturated. Motor function intact - moving foot and toes well on exam.  Hemovac pulled without difficulty. Tip intact. Reinforced with new dressing.  Assessment/Plan: 1 Day Post-Op Procedure(s) (LRB): RIGHT TOTAL KNEE ARTHROPLASTY (Right)  Advance diet Up with therapy D/C IV fluids Past Medical History:  Diagnosis Date  . Anxiety   . GERD (gastroesophageal reflux disease)   . Hypertension   . IBS (irritable bowel syndrome)   . Melanoma ([place])    followed by Dr Evorn Gong  . Sleep apnea   . Vitamin D deficiency     DVT Prophylaxis - Lovenox Protocol Weight-Bearing as tolerated to Right leg No vaccines.  Plan D/C tomorrow (or pt has mentioned maybe until Monday to feel safer and more ready to home. Will determine tomorrow based on progress)  Anticipated LOS equal to or greater than 2 midnights due to - Age 51 and older with one or more of the following:  - Obesity  - Expected need for hospital services (PT, OT, Nursing) required for safe  discharge  - Anticipated need for postoperative skilled nursing care or inpatient rehab  - Active co-morbidities: None  Cristle Jared M. 08/28/2017, 9:58 AM

## 2017-08-28 NOTE — Evaluation (Signed)
Physical Therapy Evaluation Patient Details Name: Jordan Benson MRN: [id number] DOB: Mar 19, 1955 Today's Date: 08/28/2017   History of Present Illness  pt s/p R TKA 08/27/2017 , hx of IBS and knee scope.   Clinical Impression  .Pt is s/p TKA resulting in the deficits listed below (see PT Problem List).  Pt will benefit from acutePT to increase their independence and safety with mobility to allow discharge home.      Follow Up Recommendations Home health PT;Follow surgeon's recommendation for DC plan and follow-up therapies    Equipment Recommendations  None recommended by PT    Recommendations for Other Services       Precautions / Restrictions Precautions Precautions: Knee Required Braces or Orthoses: Knee Immobilizer - Right Knee Immobilizer - Right: On when out of bed or walking;Discontinue once straight leg raise with < 10 degree lag Restrictions Weight Bearing Restrictions: No      Mobility  Bed Mobility Overal bed mobility: Needs Assistance Bed Mobility: Supine to Sit;Sit to Supine     Supine to sit: Min assist     General bed mobility comments: with Le and cues for sequencing   Transfers Overall transfer level: Needs assistance Equipment used: Rolling walker (2 wheeled) Transfers: Sit to/from Stand Sit to Stand: Min assist         General transfer comment: cues for safety to RW and sequencing   Ambulation/Gait Ambulation/Gait assistance: Min guard Gait Distance (Feet): 60 Feet Assistive device: Rolling walker (2 wheeled) Gait Pattern/deviations: Step-to pattern     General Gait Details: Ki today due to first time up , tolerated well, step to pattern for first day, anticipate will progress nicely   Stairs            Wheelchair Mobility    Modified Rankin (Stroke Patients Only)       Balance                                             Pertinent Vitals/Pain Pain Assessment: 0-10 Pain Score: 1  Pain Location: R  knee  Pain Descriptors / Indicators: Dull;Aching Pain Intervention(s): Ice applied    Home Living Family/patient expects to be discharged to:: Private residence Living Arrangements: Spouse/significant other(very limited with what he can assist with ) Available Help at Discharge: Family;Available PRN/intermittently Type of Home: Other(Comment)(townhome ) Home Access: Level entry     Home Layout: Two level;Full bath on main level;Able to live on main level with bedroom/bathroom Home Equipment: Walker - 2 wheels(borrowing one from daughter)      Prior Function Level of Independence: Independent         Comments: works at [employer]        Extremity/Trunk Assessment        Lower Extremity Assessment Lower Extremity Assessment: Overall WFL for tasks assessed;RLE deficits/detail RLE Deficits / Details: SLR 7 times independently, knee flexion to 70 limited by dressing, very little pain        Communication   Communication: No difficulties  Cognition Arousal/Alertness: Awake/alert Behavior During Therapy: WFL for tasks assessed/performed Overall Cognitive Status: Within Functional Limits for tasks assessed  General Comments      Exercises Total Joint Exercises Ankle Circles/Pumps: AROM;Both;10 reps;Supine Quad Sets: AROM;10 reps;Both;Supine Heel Slides: AAROM;Right;10 reps;Supine Straight Leg Raises: AAROM;10 reps;Right;Supine Goniometric ROM: 0-70 limited by dressing and adhesive    Assessment/Plan    PT Assessment Patient needs continued PT services  PT Problem List Decreased strength;Decreased range of motion;Decreased activity tolerance;Decreased mobility       PT Treatment Interventions DME instruction;Gait training;Functional mobility training;Therapeutic activities;Therapeutic exercise;Patient/family education    PT Goals (Current goals can be found in the Care Plan section)  Acute  Rehab PT Goals Patient Stated Goal: I want to get better soon!!! I can't wait  PT Goal Formulation: With patient Time For Goal Achievement: 09/11/17 Potential to Achieve Goals: Good    Frequency 7X/week   Barriers to discharge        Co-evaluation               AM-PAC PT "6 Clicks" Daily Activity  Outcome Measure Difficulty turning over in bed (including adjusting bedclothes, sheets and blankets)?: Unable Difficulty moving from lying on back to sitting on the side of the bed? : Unable Difficulty sitting down on and standing up from a chair with arms (e.g., wheelchair, bedside commode, etc,.)?: Unable Help needed moving to and from a bed to chair (including a wheelchair)?: A Little Help needed walking in hospital room?: A Little Help needed climbing 3-5 steps with a railing? : A Little 6 Click Score: 12    End of Session Equipment Utilized During Treatment: Gait belt Activity Tolerance: Patient tolerated treatment well Patient left: in chair;with call bell/phone within reach;with chair alarm set Nurse Communication: Mobility status PT Visit Diagnosis: Other abnormalities of gait and mobility (R26.89)    Time: 2563-8937 PT Time Calculation (min) (ACUTE ONLY): 37 min   Charges:   PT Evaluation $PT Eval Low Complexity: 1 Low PT Treatments $Gait Training: 8-22 mins   PT G CodesClide Dales, PT Pager: [phone] 08/28/2017   Eldine Rencher, Gatha Mayer 08/28/2017, 10:33 AM

## 2017-08-29 LAB — CBC
HCT: 32.8 % — ABNORMAL LOW (ref 36.0–46.0)
HEMOGLOBIN: 11.2 g/dL — AB (ref 12.0–15.0)
MCH: 31.5 pg (ref 26.0–34.0)
MCHC: 34.1 g/dL (ref 30.0–36.0)
MCV: 92.4 fL (ref 78.0–100.0)
PLATELETS: 275 10*3/uL (ref 150–400)
RBC: 3.55 MIL/uL — ABNORMAL LOW (ref 3.87–5.11)
RDW: 11.9 % (ref 11.5–15.5)
WBC: 11.2 10*3/uL — ABNORMAL HIGH (ref 4.0–10.5)

## 2017-08-29 NOTE — Discharge Summary (Signed)
Physician Discharge Summary  Patient ID: Jordan Benson MRN: [id number] DOB/AGE: [birth year] 62 y.o.  Admit date: 08/27/2017 Discharge date: 08/29/2017  Admission Diagnoses: knee OA  Discharge Diagnoses:  Active Problems:   S/P knee replacement   Osteoarthritis of right knee   Discharged Condition:good  Hospital Course:  Jordan Benson is a 62 y.o. who was admitted to [facility]. They were brought to the operating room on 08/27/2017 and underwent Procedure(s): RIGHT TOTAL KNEE ARTHROPLASTY.  Patient tolerated the procedure well and was later transferred to the recovery room and then to the orthopaedic floor for postoperative care.  They were given PO and IV analgesics for pain control following their surgery.  They were given 24 hours of postoperative antibiotics of  Anti-infectives (From admission, onward)   Start     Dose/Rate Route Frequency Ordered Stop   08/28/17 0600  ceFAZolin (ANCEF) IVPB 2g/100 mL premix     2 g 200 mL/hr over 30 Minutes Intravenous On call to O.R. 08/27/17 1310 08/27/17 1612   08/27/17 2200  ceFAZolin (ANCEF) IVPB 2g/100 mL premix     2 g 200 mL/hr over 30 Minutes Intravenous Every 6 hours 08/27/17 2018 08/28/17 0508     and started on DVT prophylaxis in the form of lovenox.   PT and OT were ordered for total joint protocol.  Discharge planning consulted to help with postop disposition and equipment needs.  Patient had a good night on the evening of surgery and started to get up OOB with therapy on day one.  Hemovac drain was pulled without difficulty.  Continued to work with therapy into day two.  Dressing was with normal limits.  The patient had progressed with therapy and meeting their goals. Patient was seen in rounds and was ready to go home.  Consults: n/a  Significant Diagnostic Studies: routine  Treatments: routine  Discharge Exam: Blood pressure 132/67, pulse 61, temperature 98 F (36.7 C), temperature source Oral, resp. rate 20,  height 5\' 8"  (1.727 m), weight 93.9 kg (207 lb), SpO2 97 %. Well nourished. Alert and oriented x3. RRR, Lungs clear, BS x4. Abdomen soft and non tender. Right Calf soft and non tender. Right knee dressing C/D/I. No DVT signs. Compartment soft. No signs of infection.  Right LE grossly neurovascular intact.  Disposition:   Discharge Instructions    Call MD / Call 911   Complete by:  As directed    If you experience chest pain or shortness of breath, CALL 911 and be transported to the hospital emergency room.  If you develope a fever above 101 F, pus (white drainage) or increased drainage or redness at the wound, or calf pain, call your surgeon's office.   Call MD / Call 911   Complete by:  As directed    If you experience chest pain or shortness of breath, CALL 911 and be transported to the hospital emergency room.  If you develope a fever above 101 F, pus (white drainage) or increased drainage or redness at the wound, or calf pain, call your surgeon's office.   Constipation Prevention   Complete by:  As directed    Drink plenty of fluids.  Prune juice may be helpful.  You may use a stool softener, such as Colace (over the counter) 100 mg twice a day.  Use MiraLax (over the counter) for constipation as needed.   Constipation Prevention   Complete by:  As directed    Drink plenty of fluids.  Prune  juice may be helpful.  You may use a stool softener, such as Colace (over the counter) 100 mg twice a day.  Use MiraLax (over the counter) for constipation as needed.   Diet - low sodium heart healthy   Complete by:  As directed    Diet - low sodium heart healthy   Complete by:  As directed    Discharge instructions   Complete by:  As directed    INSTRUCTIONS AFTER JOINT REPLACEMENT   Remove items at home which could result in a fall. This includes throw rugs or furniture in walking pathways ICE to the affected joint every three hours while awake for 30 minutes at a time, for at least the first 3-5  days, and then as needed for pain and swelling.  Continue to use ice for pain and swelling. You may notice swelling that will progress down to the foot and ankle.  This is normal after surgery.  Elevate your leg when you are not up walking on it.   Continue to use the breathing machine you got in the hospital (incentive spirometer) which will help keep your temperature down.  It is common for your temperature to cycle up and down following surgery, especially at night when you are not up moving around and exerting yourself.  The breathing machine keeps your lungs expanded and your temperature down.   DIET:  As you were doing prior to hospitalization, we recommend a well-balanced diet.  DRESSING / WOUND CARE / SHOWERING  Keep the surgical dressing until follow up.  The dressing is water proof, so you can shower without any extra covering.  IF THE DRESSING FALLS OFF or the wound gets wet inside, change the dressing with sterile gauze.  Please use good hand washing techniques before changing the dressing.  Do not use any lotions or creams on the incision until instructed by your surgeon.    ACTIVITY  Increase activity slowly as tolerated, but follow the weight bearing instructions below.   No driving for 6 weeks or until further direction given by your physician.  You cannot drive while taking narcotics.  No lifting or carrying greater than 10 lbs. until further directed by your surgeon. Avoid periods of inactivity such as sitting longer than an hour when not asleep. This helps prevent blood clots.  You may return to work once you are authorized by your doctor.     WEIGHT BEARING   Weight bearing as tolerated with assist device (walker, cane, etc) as directed, use it as long as suggested by your surgeon or therapist, typically at least 4-6 weeks.   EXERCISES  Results after joint replacement surgery are often greatly improved when you follow the exercise, range of motion and muscle  strengthening exercises prescribed by your doctor. Safety measures are also important to protect the joint from further injury. Any time any of these exercises cause you to have increased pain or swelling, decrease what you are doing until you are comfortable again and then slowly increase them. If you have problems or questions, call your caregiver or physical therapist for advice.   Rehabilitation is important following a joint replacement. After just a few days of immobilization, the muscles of the leg can become weakened and shrink (atrophy).  These exercises are designed to build up the tone and strength of the thigh and leg muscles and to improve motion. Often times heat used for twenty to thirty minutes before working out will loosen up your tissues and  help with improving the range of motion but do not use heat for the first two weeks following surgery (sometimes heat can increase post-operative swelling).   These exercises can be done on a training (exercise) mat, on the floor, on a table or on a bed. Use whatever works the best and is most comfortable for you.    Use music or television while you are exercising so that the exercises are a pleasant break in your day. This will make your life better with the exercises acting as a break in your routine that you can look forward to.   Perform all exercises about fifteen times, three times per day or as directed.  You should exercise both the operative leg and the other leg as well.   Exercises include:   Quad Sets - Tighten up the muscle on the front of the thigh (Quad) and hold for 5-10 seconds.   Straight Leg Raises - With your knee straight (if you were given a brace, keep it on), lift the leg to 60 degrees, hold for 3 seconds, and slowly lower the leg.  Perform this exercise against resistance later as your leg gets stronger.  Leg Slides: Lying on your back, slowly slide your foot toward your buttocks, bending your knee up off the floor (only go  as far as is comfortable). Then slowly slide your foot back down until your leg is flat on the floor again.  Angel Wings: Lying on your back spread your legs to the side as far apart as you can without causing discomfort.  Hamstring Strength:  Lying on your back, push your heel against the floor with your leg straight by tightening up the muscles of your buttocks.  Repeat, but this time bend your knee to a comfortable angle, and push your heel against the floor.  You may put a pillow under the heel to make it more comfortable if necessary.   A rehabilitation program following joint replacement surgery can speed recovery and prevent re-injury in the future due to weakened muscles. Contact your doctor or a physical therapist for more information on knee rehabilitation.    CONSTIPATION  Constipation is defined medically as fewer than three stools per week and severe constipation as less than one stool per week.  Even if you have a regular bowel pattern at home, your normal regimen is likely to be disrupted due to multiple reasons following surgery.  Combination of anesthesia, postoperative narcotics, change in appetite and fluid intake all can affect your bowels.   YOU MUST use at least one of the following options; they are listed in order of increasing strength to get the job done.  They are all available over the counter, and you may need to use some, POSSIBLY even all of these options:    Drink plenty of fluids (prune juice may be helpful) and high fiber foods Colace 100 mg by mouth twice a day  Senokot for constipation as directed and as needed Dulcolax (bisacodyl), take with full glass of water  Miralax (polyethylene glycol) once or twice a day as needed.  If you have tried all these things and are unable to have a bowel movement in the first 3-4 days after surgery call either your surgeon or your primary doctor.    If you experience loose stools or diarrhea, hold the medications until you  stool forms back up.  If your symptoms do not get better within 1 week or if they get worse, check with  your doctor.  If you experience "the worst abdominal pain ever" or develop nausea or vomiting, please contact the office immediately for further recommendations for treatment.   ITCHING:  If you experience itching with your medications, try taking only a single pain pill, or even half a pain pill at a time.  You can also use Benadryl over the counter for itching or also to help with sleep.   TED HOSE STOCKINGS:  Use stockings on both legs until for at least 2 weeks or as directed by physician office. They may be removed at night for sleeping.  MEDICATIONS:  See your medication summary on the "After Visit Summary" that nursing will review with you.  You may have some home medications which will be placed on hold until you complete the course of blood thinner medication.  It is important for you to complete the blood thinner medication as prescribed.  PRECAUTIONS:  If you experience chest pain or shortness of breath - call 911 immediately for transfer to the hospital emergency department.   If you develop a fever greater that 101 F, purulent drainage from wound, increased redness or drainage from wound, foul odor from the wound/dressing, or calf pain - CONTACT YOUR SURGEON.                                                   FOLLOW-UP APPOINTMENTS:  If you do not already have a post-op appointment, please call the office for an appointment to be seen by your surgeon.  Guidelines for how soon to be seen are listed in your "After Visit Summary", but are typically between 1-4 weeks after surgery.  OTHER INSTRUCTIONS:   Knee Replacement:  Do not place pillow under knee, focus on keeping the knee straight while resting. CPM instructions: 0-90 degrees, 2 hours in the morning, 2 hours in the afternoon, and 2 hours in the evening. Place foam block, curve side up under heel at all times except when in CPM or  when walking.  DO NOT modify, tear, cut, or change the foam block in any way.  MAKE SURE YOU:  Understand these instructions.  Get help right away if you are not doing well or get worse.    Thank you for letting us be a part of your medical care team.  It is a privilege we respect greatly.  We hope these instructions will help you stay on track for a fast and full recovery!   Increase activity slowly as tolerated   Complete by:  As directed    Increase activity slowly as tolerated   Complete by:  As directed    Weight bearing as tolerated   Complete by:  As directed      Allergies as of 08/29/2017   No Known Allergies     Medication List    TAKE these medications   ALPRAZolam 0.25 MG tablet Commonly known as:  XANAX Take 1 tablet (0.25 mg total) by mouth daily as needed. for anxiety   amLODipine 5 MG tablet Commonly known as:  NORVASC Take 1 tablet (5 mg total) by mouth daily.   aspirin EC 325 MG tablet Take 1 tablet (325 mg total) by mouth 2 (two) times daily.   enalapril 10 MG tablet Commonly known as:  VASOTEC Take 1 tablet (10 mg total) by mouth 2 (two)  times daily.   FLUoxetine 40 MG capsule Commonly known as:  PROZAC Take 1 capsule (40 mg total) by mouth daily.   methocarbamol 500 MG tablet Commonly known as:  ROBAXIN Take 1 tablet (500 mg total) by mouth every 8 (eight) hours as needed for muscle spasms.   milk thistle 175 MG tablet Take 175 mg by mouth daily.   ONE-A-DAY WOMENS PO Take 1 tablet by mouth daily.   oxyCODONE 5 MG immediate release tablet Commonly known as:  ROXICODONE Take 1 tablet (5 mg total) by mouth every 4 (four) hours as needed for up to 7 days.   triamterene-hydrochlorothiazide 37.5-25 MG tablet Commonly known as:  MAXZIDE-25 Take 1 tablet by mouth daily.   valACYclovir 1000 MG tablet Commonly known as:  VALTREX Take 1 tablet (1,000 mg total) by mouth as needed. What changed:    when to take this  reasons to take this             Discharge Care Instructions  (From admission, onward)        Start     Ordered   08/29/17 0000  Weight bearing as tolerated     08/29/17 0902     Follow-up Information    [facility] Follow up.   Specialty:  [facility]vel[facility] Why:  [facility] will call to arrange initial appointment Contact information: [place] [address] [phone]        Sydnee Cabal, MD. Schedule an appointment as soon as possible for a visit in 2 week(s).   Specialty:  Orthopedic Surgery Contact information: [address] [place] [address] [phone]           Signed: Lajean Manes 08/29/2017, 7:07 PM

## 2017-08-29 NOTE — Progress Notes (Signed)
Physical Therapy Treatment Patient Details Name: Jordan Benson MRN: [id number] DOB: 1955/03/02 Today's Date: 08/29/2017    History of Present Illness pt s/p R TKA 08/27/2017 , hx of IBS and knee scope.     PT Comments    POD #2 Pt progressing well.  Able to perform SLR so did not use KI.  Assisted to EOB.  Assisted with transfers, Pt impulsive and required repeat instruction on proper hand placement to rise (avoid pulling up on walker) and with desend (pt tends to plop/uncontrolled.)  Tolerated a great distances in hallway.  Noted decreased WBing R LE and no knee flex (pt amb peg legged)   Follow Up Recommendations  Home health PT;Follow surgeon's recommendation for DC plan and follow-up therapies     Equipment Recommendations  Rolling walker with 5" wheels(delivered and in room)    Recommendations for Other Services       Precautions / Restrictions Precautions Precautions: Knee Precaution Comments: pt able to perform SLR so did not use KI Knee Immobilizer - Right: On when out of bed or walking;Discontinue once straight leg raise with < 10 degree lag Restrictions Weight Bearing Restrictions: No Other Position/Activity Restrictions: WBAT    Mobility  Bed Mobility Overal bed mobility: Needs Assistance Bed Mobility: Supine to Sit;Sit to Supine     Supine to sit: Supervision;Min guard Sit to supine: Supervision;Min guard   General bed mobility comments: with Le and cues for sequencing   pt impulsive   Transfers Overall transfer level: Needs assistance Equipment used: Rolling walker (2 wheeled) Transfers: Sit to/from Stand Sit to Stand: Min guard;Min assist         General transfer comment: cues for safety to RW and sequencing as well as hand placement as pt tends to "plop"   Instructed to avoid pulling up on walker  Ambulation/Gait Ambulation/Gait assistance: Supervision;Min guard Gait Distance (Feet): 115 Feet Assistive device: Rolling walker (2  wheeled) Gait Pattern/deviations: Step-to pattern;Decreased stance time - right Gait velocity: decreased    General Gait Details: def step to gait with minimal R knee flex/heel strike     25% VC's safety with turns with walker   Stairs Stairs: (no stairs ([place] [facility]))           Wheelchair Mobility    Modified Rankin (Stroke Patients Only)       Balance                                            Cognition   Behavior During Therapy: WFL for tasks assessed/performed Overall Cognitive Status: Within Functional Limits for tasks assessed                                        Exercises      General Comments        Pertinent Vitals/Pain Pain Assessment: 0-10 Pain Score: 6  Pain Location: R knee  Pain Descriptors / Indicators: Dull;Aching;Operative site guarding Pain Intervention(s): Monitored during session;Repositioned;Ice applied    Home Living                      Prior Function            PT Goals (current goals can now be found in the care plan section)  Progress towards PT goals: Progressing toward goals    Frequency    7X/week      PT Plan Current plan remains appropriate    Co-evaluation              AM-PAC PT "6 Clicks" Daily Activity  Outcome Measure  Difficulty turning over in bed (including adjusting bedclothes, sheets and blankets)?: A Little Difficulty moving from lying on back to sitting on the side of the bed? : A Little Difficulty sitting down on and standing up from a chair with arms (e.g., wheelchair, bedside commode, etc,.)?: A Little Help needed moving to and from a bed to chair (including a wheelchair)?: A Little Help needed walking in hospital room?: A Little Help needed climbing 3-5 steps with a railing? : A Little 6 Click Score: 18    End of Session Equipment Utilized During Treatment: Gait belt Activity Tolerance: Patient tolerated treatment well Patient left: with call  bell/phone within reach;with chair alarm set;in bed;with family/visitor present Nurse Communication: Mobility status(pt ready for D/C to home after just one PT session) PT Visit Diagnosis: Other abnormalities of gait and mobility (R26.89)     Time: 5537-4827 PT Time Calculation (min) (ACUTE ONLY): 27 min  Charges:  $Gait Training: 8-22 mins $Therapeutic Exercise: 8-22 mins                    G Codes:       Rica Koyanagi  PTA [facility]  Acute  Rehab Pager      [phone]

## 2017-08-29 NOTE — Progress Notes (Signed)
Subjective: 2 Days Post-Op Procedure(s) (LRB): RIGHT TOTAL KNEE ARTHROPLASTY (Right)  Patient reports pain as mild to moderate.  Tolerating POs well.  Admits to flatus.  Denies fever, chills, N/V, CP, SOB.  Reports that her daughter can likely pick her up from the hospital this evening.  Objective:   VITALS:  Temp:  [97.8 F (36.6 C)-98.4 F (36.9 C)] 98 F (36.7 C) (07/21 0532) Pulse Rate:  [61-75] 61 (07/21 0532) Resp:  [16-20] 20 (07/21 0532) BP: (116-145)/(67-74) 132/67 (07/21 0532) SpO2:  [96 %-98 %] 97 % (07/21 0532)  General: WDWN patient in NAD. Psych:  Appropriate mood and affect. Neuro:  A&O x 3, Moving all extremities, sensation intact to light touch HEENT:  EOMs intact Chest:  Even non-labored respirations Skin:  Dressing C/D/I, no rashes or lesions Extremities: warm/dry, no edema, erythema or echymosis.  No lymphadenopathy. Pulses: Femoral 2+ MSK:  ROM: lacks 5 degrees of TKE, MMT: able to perform quad set, (-) Homan's    LABS Recent Labs    08/28/17 0452 08/29/17 0517  HGB 12.7 11.2*  WBC 8.2 11.2*  PLT 294 275   Recent Labs    08/28/17 0452  NA 140  K 4.0  CL 104  CO2 27  BUN 16  CREATININE 0.71  GLUCOSE 136*   No results for input(s): LABPT, INR in the last 72 hours.   Assessment/Plan: 2 Days Post-Op Procedure(s) (LRB): RIGHT TOTAL KNEE ARTHROPLASTY (Right)  Patient seen in rounds for Dr. Timoteo Gaul LE D/C home today. Scripts on chart Plan for 2 week outpatient visit with Dr. [facility] Office:  [phone]

## 2017-08-30 ENCOUNTER — Encounter (HOSPITAL_COMMUNITY): Payer: Self-pay | Admitting: Specialist

## 2017-08-30 NOTE — Addendum Note (Signed)
Addendum  created 08/30/17 1100 by Lollie Sails, CRNA   Charge Capture section accepted

## 2017-09-01 ENCOUNTER — Telehealth: Payer: Self-pay | Admitting: Internal Medicine

## 2017-09-01 NOTE — Telephone Encounter (Signed)
My chart message sent to pt to confirm doing ok after surgery.

## 2017-09-06 ENCOUNTER — Other Ambulatory Visit: Payer: Self-pay | Admitting: Internal Medicine

## 2017-10-08 ENCOUNTER — Encounter: Payer: BC Managed Care – PPO | Admitting: Family

## 2017-10-08 ENCOUNTER — Ambulatory Visit (INDEPENDENT_AMBULATORY_CARE_PROVIDER_SITE_OTHER): Payer: BC Managed Care – PPO | Admitting: Internal Medicine

## 2017-10-08 VITALS — BP 118/72 | HR 82 | Temp 97.9°F | Resp 18 | Ht 68.0 in | Wt 204.2 lb

## 2017-10-08 DIAGNOSIS — F419 Anxiety disorder, unspecified: Secondary | ICD-10-CM | POA: Diagnosis not present

## 2017-10-08 DIAGNOSIS — R7989 Other specified abnormal findings of blood chemistry: Secondary | ICD-10-CM

## 2017-10-08 DIAGNOSIS — Z1239 Encounter for other screening for malignant neoplasm of breast: Secondary | ICD-10-CM

## 2017-10-08 DIAGNOSIS — R945 Abnormal results of liver function studies: Secondary | ICD-10-CM

## 2017-10-08 DIAGNOSIS — I10 Essential (primary) hypertension: Secondary | ICD-10-CM

## 2017-10-08 DIAGNOSIS — K589 Irritable bowel syndrome without diarrhea: Secondary | ICD-10-CM

## 2017-10-08 DIAGNOSIS — Z1231 Encounter for screening mammogram for malignant neoplasm of breast: Secondary | ICD-10-CM | POA: Diagnosis not present

## 2017-10-08 DIAGNOSIS — E7800 Pure hypercholesterolemia, unspecified: Secondary | ICD-10-CM

## 2017-10-08 DIAGNOSIS — Z96651 Presence of right artificial knee joint: Secondary | ICD-10-CM

## 2017-10-08 DIAGNOSIS — E559 Vitamin D deficiency, unspecified: Secondary | ICD-10-CM

## 2017-10-08 DIAGNOSIS — C439 Malignant melanoma of skin, unspecified: Secondary | ICD-10-CM

## 2017-10-08 MED ORDER — ALPRAZOLAM 0.25 MG PO TABS: 0.2500 mg | ORAL_TABLET | Freq: Every day | ORAL | 0 refills | Status: DC | PRN

## 2017-10-08 NOTE — Progress Notes (Signed)
Patient ID: Jordan Benson, female   DOB: 25-Nov-1955, 62 y.o.   MRN: [id number]   Subjective:    Patient ID: Jordan Benson, female    DOB: 01/13/1956, 62 y.o.   MRN: [id number]  HPI  Patient here for a scheduled follow up.  Was scheduled for a physical, but not due.  Had in 05/2017.   She is s/p TKA 08/27/17.  Followed by ortho.  Doing well.  Trying to stay as active as possible. No chest pain.  No sob.  No acid reflux.  No abdominal pain.  Bowels moving.  No urine change.  Overall feels good.  Handling stress.     Past Medical History:  Diagnosis Date  . Anxiety   . GERD (gastroesophageal reflux disease)   . Hypertension   . IBS (irritable bowel syndrome)   . Melanoma ([place])    followed by Dr Evorn Gong  . Sleep apnea   . Vitamin D deficiency    Past Surgical History:  Procedure Laterality Date  . ABDOMINAL HYSTERECTOMY  1992  . MOHS SURGERY  2012  . OVARIAN CYST REMOVAL  1991  . SEPTOPLASTY  2009  . TOTAL KNEE 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   Procedure: RIGHT TOTAL KNEE ARTHROPLASTY;  Surgeon: Sydnee Cabal, MD;  Location: [facility];  Service: Orthopedics;  Laterality: Right;  2 hrs  . TUBAL LIGATION  1985  . tummy tuck  1995  . WISDOM TOOTH EXTRACTION     Family History  Problem Relation Age of Onset  . Cancer Mother 68       breast cancer  . Heart disease Mother   . Hypertension Mother   . Diabetes Mother   . Breast cancer Mother 80  . Heart disease Father    Social History   Socioeconomic History  . Marital status: Married    Spouse name: Not on file  . Number of children: 2  . Years of education: Not on file  . Highest education level: Not on file  Occupational History    Employer: [employer]  . Financial resource strain: Not on file  . Food insecurity:    Worry: Not on file    Inability: Not on file  . Transportation needs:    Medical: Not on file    Non-medical: Not on file  Tobacco Use  . Smoking status: Former Smoker    Packs/day: 0.50   Years: 10.00    Pack years: 5.00    Types: Cigarettes    Last attempt to quit: 03/12/2000    Years since quitting: 17.5  . Smokeless tobacco: Never Used  Substance and Sexual Activity  . Alcohol use: Yes    Alcohol/week: 1.0 standard drinks    Types: 1 Standard drinks or equivalent per week    Comment: daily  . Drug use: No  . Sexual activity: Not on file  Lifestyle  . Physical activity:    Days per week: Not on file    Minutes per session: Not on file  . Stress: Not on file  Relationships  . Social connections:    Talks on phone: Not on file    Gets together: Not on file    Attends religious service: Not on file    Active member of club or organization: Not on file    Attends meetings of clubs or organizations: Not on file    Relationship status: Not on file  Other Topics Concern  . Not on file  Social History  Narrative  . Not on file    Outpatient Encounter Medications as of 10/08/2017  Medication Sig  . ALPRAZolam (XANAX) 0.25 MG tablet Take 1 tablet (0.25 mg total) by mouth daily as needed. for anxiety  . amLODipine (NORVASC) 5 MG tablet TAKE 1 TABLET(5 MG) BY MOUTH DAILY( GENERIC EQUIVALENT TO NORVASC)  . enalapril (VASOTEC) 10 MG tablet Take 1 tablet (10 mg total) by mouth 2 (two) times daily.  Marland Kitchen FLUoxetine (PROZAC) 40 MG capsule Take 1 capsule (40 mg total) by mouth daily.  . Multiple Vitamins-Calcium (ONE-A-DAY WOMENS PO) Take 1 tablet by mouth daily.  Marland Kitchen triamterene-hydrochlorothiazide (MAXZIDE-25) 37.5-25 MG tablet Take 1 tablet by mouth daily.  . valACYclovir (VALTREX) 1000 MG tablet Take 1 tablet (1,000 mg total) by mouth as needed. (Patient taking differently: Take 1,000 mg by mouth daily as needed (for outbreaks). )  . [DISCONTINUED] ALPRAZolam (XANAX) 0.25 MG tablet Take 1 tablet (0.25 mg total) by mouth daily as needed. for anxiety  . [DISCONTINUED] aspirin EC 325 MG tablet Take 1 tablet (325 mg total) by mouth 2 (two) times daily.  . [DISCONTINUED]  methocarbamol (ROBAXIN) 500 MG tablet Take 1 tablet (500 mg total) by mouth every 8 (eight) hours as needed for muscle spasms.  . [DISCONTINUED] milk thistle 175 MG tablet Take 175 mg by mouth daily.   No facility-administered encounter medications on file as of 10/08/2017.     Review of Systems  Constitutional: Negative for appetite change and unexpected weight change.  HENT: Negative for congestion and sinus pressure.   Eyes: Negative for pain and visual disturbance.  Respiratory: Negative for cough, chest tightness and shortness of breath.   Cardiovascular: Negative for chest pain, palpitations and leg swelling.  Gastrointestinal: Negative for abdominal pain, diarrhea, nausea and vomiting.  Genitourinary: Negative for difficulty urinating and dysuria.  Musculoskeletal: Negative for myalgias.       S/p right TKA.  Swelling has improved.  Increasing rom.    Skin: Negative for color change and rash.  Neurological: Negative for dizziness, light-headedness and headaches.  Hematological: Negative for adenopathy. Does not bruise/bleed easily.  Psychiatric/Behavioral: Negative for agitation and dysphoric mood.       Objective:    Physical Exam  Constitutional: She is oriented to person, place, and time. She appears well-developed and well-nourished. No distress.  HENT:  Nose: Nose normal.  Mouth/Throat: Oropharynx is clear and moist.  Eyes: Right eye exhibits no discharge. Left eye exhibits no discharge. No scleral icterus.  Neck: Neck supple. No thyromegaly present.  Cardiovascular: Normal rate and regular rhythm.  Pulmonary/Chest: Breath sounds normal. No accessory muscle usage. No tachypnea. No respiratory distress. She has no decreased breath sounds. She has no wheezes. She has no rhonchi. Right breast exhibits no inverted nipple, no mass, no nipple discharge and no tenderness (no axillary adenopathy). Left breast exhibits no inverted nipple, no mass, no nipple discharge and no  tenderness (no axilarry adenopathy).  Abdominal: Soft. Bowel sounds are normal. There is no tenderness.  Musculoskeletal: She exhibits no tenderness.  Some soft tissue swelling - right knee.  Well healed incision site.    Lymphadenopathy:    She has no cervical adenopathy.  Neurological: She is alert and oriented to person, place, and time.  Skin: No rash noted. No erythema.  Psychiatric: She has a normal mood and affect. Her behavior is normal.    BP 118/72 (BP Location: Left Arm, Patient Position: Sitting, Cuff Size: Large)   Pulse 82   Temp  97.9 F (36.6 C) (Oral)   Resp 18   Ht 5\' 8"  (1.727 m)   Wt 204 lb 3.2 oz (92.6 kg)   SpO2 98%   BMI 31.05 kg/m  Wt Readings from Last 3 Encounters:  10/08/17 204 lb 3.2 oz (92.6 kg)  08/27/17 207 lb (93.9 kg)  08/13/17 207 lb (93.9 kg)     Lab Results  Component Value Date   WBC 11.2 (H) 08/29/2017   HGB 11.2 (L) 08/29/2017   HCT 32.8 (L) 08/29/2017   PLT 275 08/29/2017   GLUCOSE 136 (H) 08/28/2017   CHOL 190 03/03/2017   TRIG 150.0 (H) 03/03/2017   HDL 56.70 03/03/2017   LDLCALC 103 (H) 03/03/2017   ALT 29 03/03/2017   AST 18 03/03/2017   NA 140 08/28/2017   K 4.0 08/28/2017   CL 104 08/28/2017   CREATININE 0.71 08/28/2017   BUN 16 08/28/2017   CO2 27 08/28/2017   TSH 1.78 05/15/2016   INR 0.94 08/13/2017   HGBA1C 5.6 03/03/2017       Assessment & Plan:   Problem List Items Addressed This Visit    Abnormal liver function test    Has significantly decreased her alcohol intake.  Rarely drinks now.  Diet and exercise.  Follow liver panel.        Anxiety    On prozac and doing better.  Follow.        Relevant Medications   ALPRAZolam (XANAX) 0.25 MG tablet   Essential hypertension, benign    Blood pressure under good control.  Continue same medication regimen.  Follow pressures.  Follow metabolic panel.        Hyperbilirubinemia    Follow liver panel.       Hypercholesterolemia    Low cholesterol diet and  exercise.  Follow lipid panel.        IBS (irritable bowel syndrome)    Bowels stable.        Melanoma ([place])    Followed by dermatology.        Relevant Medications   ALPRAZolam (XANAX) 0.25 MG tablet   S/P knee replacement    Doing well s/p surgery.  Decreased swelling. Continue f/u with ortho.        Vitamin D deficiency    Follow vitamin D level.         Other Visit Diagnoses    Breast cancer screening    -  Primary   Relevant Orders   MM 3D SCREEN BREAST BILATERAL       Einar Pheasant, MD

## 2017-10-10 HISTORY — PX: JOINT REPLACEMENT: SHX530

## 2017-10-11 ENCOUNTER — Encounter: Payer: Self-pay | Admitting: Internal Medicine

## 2017-10-11 NOTE — Assessment & Plan Note (Signed)
Has significantly decreased her alcohol intake.  Rarely drinks now.  Diet and exercise.  Follow liver panel.

## 2017-10-11 NOTE — Assessment & Plan Note (Signed)
Low cholesterol diet and exercise.  Follow lipid panel.   

## 2017-10-11 NOTE — Assessment & Plan Note (Signed)
Doing well s/p surgery.  Decreased swelling. Continue f/u with ortho.

## 2017-10-11 NOTE — Assessment & Plan Note (Signed)
Followed by dermatology

## 2017-10-11 NOTE — Assessment & Plan Note (Signed)
On prozac and doing better.  Follow.

## 2017-10-11 NOTE — Assessment & Plan Note (Signed)
Follow vitamin D level.  

## 2017-10-11 NOTE — Assessment & Plan Note (Signed)
Blood pressure under good control.  Continue same medication regimen.  Follow pressures.  Follow metabolic panel.   

## 2017-10-11 NOTE — Assessment & Plan Note (Signed)
Follow liver panel.  

## 2017-10-11 NOTE — Assessment & Plan Note (Signed)
Bowels stable.  

## 2017-10-15 ENCOUNTER — Ambulatory Visit: Payer: Self-pay | Admitting: Family Medicine

## 2017-10-18 ENCOUNTER — Other Ambulatory Visit: Payer: Self-pay | Admitting: Internal Medicine

## 2017-11-14 ENCOUNTER — Other Ambulatory Visit: Payer: Self-pay | Admitting: Internal Medicine

## 2017-11-23 ENCOUNTER — Other Ambulatory Visit: Payer: Self-pay | Admitting: Internal Medicine

## 2017-12-06 ENCOUNTER — Other Ambulatory Visit: Payer: Self-pay | Admitting: Internal Medicine

## 2017-12-06 ENCOUNTER — Ambulatory Visit
Admission: RE | Admit: 2017-12-06 | Discharge: 2017-12-06 | Disposition: A | Discharge status: Home / Self Care | Payer: BC Managed Care – PPO | Source: Ambulatory Visit | Attending: Internal Medicine | Admitting: Internal Medicine

## 2017-12-06 DIAGNOSIS — N6489 Other specified disorders of breast: Secondary | ICD-10-CM

## 2017-12-06 DIAGNOSIS — Z1239 Encounter for other screening for malignant neoplasm of breast: Secondary | ICD-10-CM | POA: Diagnosis not present

## 2017-12-06 DIAGNOSIS — R928 Other abnormal and inconclusive findings on diagnostic imaging of breast: Secondary | ICD-10-CM

## 2017-12-07 ENCOUNTER — Other Ambulatory Visit: Payer: Self-pay | Admitting: Internal Medicine

## 2017-12-07 DIAGNOSIS — R928 Other abnormal and inconclusive findings on diagnostic imaging of breast: Secondary | ICD-10-CM

## 2017-12-07 NOTE — Progress Notes (Signed)
Order placed for f/u left breast mammogram and ultrasound.   

## 2017-12-08 ENCOUNTER — Other Ambulatory Visit: Payer: Self-pay | Admitting: Internal Medicine

## 2017-12-08 ENCOUNTER — Ambulatory Visit
Admission: RE | Admit: 2017-12-08 | Discharge: 2017-12-08 | Disposition: A | Discharge status: Home / Self Care | Payer: BC Managed Care – PPO | Source: Ambulatory Visit | Attending: Internal Medicine | Admitting: Internal Medicine

## 2017-12-08 DIAGNOSIS — N6489 Other specified disorders of breast: Secondary | ICD-10-CM

## 2017-12-08 DIAGNOSIS — R928 Other abnormal and inconclusive findings on diagnostic imaging of breast: Secondary | ICD-10-CM

## 2017-12-09 MED ORDER — ALPRAZOLAM 0.25 MG PO TABS: 0.2500 mg | ORAL_TABLET | Freq: Every day | ORAL | 0 refills | Status: DC | PRN

## 2017-12-09 NOTE — Telephone Encounter (Signed)
rx ok'd for xanax #30 with no refills.   

## 2017-12-10 ENCOUNTER — Ambulatory Visit (INDEPENDENT_AMBULATORY_CARE_PROVIDER_SITE_OTHER): Payer: BC Managed Care – PPO | Admitting: Internal Medicine

## 2017-12-10 ENCOUNTER — Encounter: Payer: Self-pay | Admitting: Internal Medicine

## 2017-12-10 VITALS — BP 138/84 | HR 73 | Temp 97.8°F | Resp 18

## 2017-12-10 DIAGNOSIS — R928 Other abnormal and inconclusive findings on diagnostic imaging of breast: Secondary | ICD-10-CM

## 2017-12-10 DIAGNOSIS — C50919 Malignant neoplasm of unspecified site of unspecified female breast: Secondary | ICD-10-CM

## 2017-12-10 DIAGNOSIS — F419 Anxiety disorder, unspecified: Secondary | ICD-10-CM | POA: Diagnosis not present

## 2017-12-10 HISTORY — DX: Malignant neoplasm of unspecified site of unspecified female breast: C50.919

## 2017-12-10 NOTE — Progress Notes (Signed)
Patient ID: Jordan Benson, female   DOB: 06-05-1955, 62 y.o.   MRN: [id number]   Subjective:    Patient ID: Jordan Benson, female    DOB: Jul 10, 1955, 62 y.o.   MRN: [id number]  HPI  Patient here as a work in with concerns regarding abnormal mammogram.  Had routine mammogram - recommended f/u views.  Follow up mammogram and ultrasound - concern regarding multiple scattered cysts and left lymph node.  Given her history of melanoma - recommended biopsy of lymph node and cystic area.  Pt reports unable to palpate any abnormality.  No tenderness.  States otherwise doing well.  No pain.     Past Medical History:  Diagnosis Date  . Anxiety   . GERD (gastroesophageal reflux disease)   . Hypertension   . IBS (irritable bowel syndrome)   . Melanoma ([place])    followed by Dr Evorn Gong  . Sleep apnea   . Vitamin D deficiency    Past Surgical History:  Procedure Laterality Date  . ABDOMINAL HYSTERECTOMY  1992  . MOHS SURGERY  2012  . OVARIAN CYST REMOVAL  1991  . SEPTOPLASTY  2009  . TOTAL KNEE 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   Procedure: RIGHT TOTAL KNEE ARTHROPLASTY;  Surgeon: Sydnee Cabal, MD;  Location: [facility];  Service: Orthopedics;  Laterality: Right;  2 hrs  . TUBAL LIGATION  1985  . tummy tuck  1995  . WISDOM TOOTH EXTRACTION     Family History  Problem Relation Age of Onset  . Cancer Mother 27       breast cancer  . Heart disease Mother   . Hypertension Mother   . Diabetes Mother   . Breast cancer Mother 49  . Heart disease Father   . Breast cancer Sister 83       on HRT   Social History   Socioeconomic History  . Marital status: Married    Spouse name: Not on file  . Number of children: 2  . Years of education: Not on file  . Highest education level: Not on file  Occupational History    Employer: [employer]  . Financial resource strain: Not on file  . Food insecurity:    Worry: Not on file    Inability: Not on file  . Transportation needs:   Medical: Not on file    Non-medical: Not on file  Tobacco Use  . Smoking status: Former Smoker    Packs/day: 0.50    Years: 10.00    Pack years: 5.00    Types: Cigarettes    Last attempt to quit: 03/12/2000    Years since quitting: 17.7  . Smokeless tobacco: Never Used  Substance and Sexual Activity  . Alcohol use: Yes    Alcohol/week: 1.0 standard drinks    Types: 1 Standard drinks or equivalent per week    Comment: daily  . Drug use: No  . Sexual activity: Not on file  Lifestyle  . Physical activity:    Days per week: Not on file    Minutes per session: Not on file  . Stress: Not on file  Relationships  . Social connections:    Talks on phone: Not on file    Gets together: Not on file    Attends religious service: Not on file    Active member of club or organization: Not on file    Attends meetings of clubs or organizations: Not on file    Relationship status: Not on  file  Other Topics Concern  . Not on file  Social History Narrative  . Not on file    Outpatient Encounter Medications as of 12/10/2017  Medication Sig  . ALPRAZolam (XANAX) 0.25 MG tablet Take 1 tablet (0.25 mg total) by mouth daily as needed. for anxiety  . amLODipine (NORVASC) 5 MG tablet TAKE 1 TABLET(5 MG) BY MOUTH DAILY( GENERIC EQUIVALENT TO NORVASC)  . enalapril (VASOTEC) 10 MG tablet TAKE 1 TABLET(10 MG) BY MOUTH TWICE DAILY  . FLUoxetine (PROZAC) 40 MG capsule TAKE 1 CAPSULE(40 MG) BY MOUTH DAILY  . Multiple Vitamins-Calcium (ONE-A-DAY WOMENS PO) Take 1 tablet by mouth daily.  Marland Kitchen triamterene-hydrochlorothiazide (MAXZIDE-25) 37.5-25 MG tablet TAKE 1 TABLET BY MOUTH DAILY  . valACYclovir (VALTREX) 1000 MG tablet Take 1 tablet (1,000 mg total) by mouth as needed. (Patient taking differently: Take 1,000 mg by mouth daily as needed (for outbreaks). )  . [DISCONTINUED] FLUoxetine (PROZAC) 40 MG capsule Take 1 capsule (40 mg total) by mouth daily.   No facility-administered encounter medications on file  as of 12/10/2017.     Review of Systems  Constitutional: Negative for appetite change and fever.  HENT: Negative for congestion.   Respiratory: Negative for cough, chest tightness and shortness of breath.   Gastrointestinal: Negative for abdominal pain, nausea and vomiting.  Skin: Negative for color change and rash.  Neurological: Negative for dizziness and headaches.  Psychiatric/Behavioral: Negative for agitation and dysphoric mood.       Objective:    Physical Exam  Constitutional: She appears well-developed and well-nourished. No distress.  Neck: Neck supple.  Cardiovascular: Normal rate and regular rhythm.  Pulmonary/Chest: Breath sounds normal. No accessory muscle usage. No tachypnea. No respiratory distress. She has no decreased breath sounds. She has no wheezes. She has no rhonchi. Right breast exhibits no inverted nipple, no mass, no nipple discharge and no tenderness (no axillary adenopathy). Left breast exhibits no inverted nipple, no mass, no nipple discharge and no tenderness (no axilarry adenopathy).  Lymphadenopathy:    She has no cervical adenopathy.  Skin: No rash noted. No erythema.  Psychiatric: She has a normal mood and affect. Her behavior is normal.    BP 138/84 (BP Location: Left Arm, Patient Position: Sitting, Cuff Size: Normal)   Pulse 73   Temp 97.8 F (36.6 C) (Oral)   Resp 18   SpO2 99%  Wt Readings from Last 3 Encounters:  10/08/17 204 lb 3.2 oz (92.6 kg)  08/27/17 207 lb (93.9 kg)  08/13/17 207 lb (93.9 kg)     Lab Results  Component Value Date   WBC 11.2 (H) 08/29/2017   HGB 11.2 (L) 08/29/2017   HCT 32.8 (L) 08/29/2017   PLT 275 08/29/2017   GLUCOSE 136 (H) 08/28/2017   CHOL 190 03/03/2017   TRIG 150.0 (H) 03/03/2017   HDL 56.70 03/03/2017   LDLCALC 103 (H) 03/03/2017   ALT 29 03/03/2017   AST 18 03/03/2017   NA 140 08/28/2017   K 4.0 08/28/2017   CL 104 08/28/2017   CREATININE 0.71 08/28/2017   BUN 16 08/28/2017   CO2 27  08/28/2017   TSH 1.78 05/15/2016   INR 0.94 08/13/2017   HGBA1C 5.6 03/03/2017    US Breast Ltd Uni Left Inc Axilla  Result Date: 12/08/2017 CLINICAL DATA:  The patient was called back for a medial left breast asymmetry EXAM: DIGITAL DIAGNOSTIC LEFT MAMMOGRAM WITH CAD AND TOMO ULTRASOUND LEFT BREAST COMPARISON:  Previous exam(s). ACR Breast Density Category  b: There are scattered areas of fibroglandular density. FINDINGS: The medial left breast asymmetry persists today's study, spanning 13 mm. It is inferiorly located on the MLO view. Mammographic images were processed with CAD. On physical exam, no suspicious lumps are identified. Targeted ultrasound is performed, showing multiple scattered cysts. No definitive correlate for the mammographic finding. There is an abnormal node in the left axilla with a cortex measuring up to 6 mm. IMPRESSION: Indeterminate asymmetry in the medial left breast. Abnormal left axillary lymph node. Of note, the patient had melanoma in the left arm. RECOMMENDATION: Recommend stereotactic biopsy of the indeterminate asymmetry in the medial left breast. Recommend ultrasound-guided biopsy of the abnormal left axillary node with a cortex measuring up to 6 mm. I have discussed the findings and recommendations with the patient. Results were also provided in writing at the conclusion of the visit. If applicable, a reminder letter will be sent to the patient regarding the next appointment. BI-RADS CATEGORY  4: Suspicious. Electronically Signed   By: Dorise Bullion III M.D   On: 12/08/2017 12:01   Mm Diag Breast Tomo Uni Left  Result Date: 12/08/2017 CLINICAL DATA:  The patient was called back for a medial left breast asymmetry EXAM: DIGITAL DIAGNOSTIC LEFT MAMMOGRAM WITH CAD AND TOMO ULTRASOUND LEFT BREAST COMPARISON:  Previous exam(s). ACR Breast Density Category b: There are scattered areas of fibroglandular density. FINDINGS: The medial left breast asymmetry persists today's  study, spanning 13 mm. It is inferiorly located on the MLO view. Mammographic images were processed with CAD. On physical exam, no suspicious lumps are identified. Targeted ultrasound is performed, showing multiple scattered cysts. No definitive correlate for the mammographic finding. There is an abnormal node in the left axilla with a cortex measuring up to 6 mm. IMPRESSION: Indeterminate asymmetry in the medial left breast. Abnormal left axillary lymph node. Of note, the patient had melanoma in the left arm. RECOMMENDATION: Recommend stereotactic biopsy of the indeterminate asymmetry in the medial left breast. Recommend ultrasound-guided biopsy of the abnormal left axillary node with a cortex measuring up to 6 mm. I have discussed the findings and recommendations with the patient. Results were also provided in writing at the conclusion of the visit. If applicable, a reminder letter will be sent to the patient regarding the next appointment. BI-RADS CATEGORY  4: Suspicious. Electronically Signed   By: Dorise Bullion III M.D   On: 12/08/2017 12:01       Assessment & Plan:   Problem List Items Addressed This Visit    Abnormal mammogram - Primary    Mammogram as outlined.  Cysts and left lymph node.  Discussed with her today.  History of melanoma.  Refer to Dr Bary Castilla for further evaluation.        Relevant Orders   Ambulatory referral to General Surgery   Anxiety    On prozac.  Doing well.  Follow.            Einar Pheasant, MD

## 2017-12-12 ENCOUNTER — Encounter: Payer: Self-pay | Admitting: Internal Medicine

## 2017-12-12 DIAGNOSIS — R928 Other abnormal and inconclusive findings on diagnostic imaging of breast: Secondary | ICD-10-CM | POA: Insufficient documentation

## 2017-12-12 NOTE — Assessment & Plan Note (Signed)
On prozac.  Doing well.  Follow.   

## 2017-12-12 NOTE — Assessment & Plan Note (Signed)
Mammogram as outlined.  Cysts and left lymph node.  Discussed with her today.  History of melanoma.  Refer to Dr Bary Castilla for further evaluation.

## 2017-12-14 ENCOUNTER — Telehealth: Payer: Self-pay

## 2017-12-14 ENCOUNTER — Ambulatory Visit: Payer: Self-pay | Admitting: Surgery

## 2017-12-14 ENCOUNTER — Other Ambulatory Visit: Payer: Self-pay

## 2017-12-14 ENCOUNTER — Other Ambulatory Visit: Payer: Self-pay | Admitting: Surgery

## 2017-12-14 DIAGNOSIS — N6489 Other specified disorders of breast: Secondary | ICD-10-CM

## 2017-12-14 DIAGNOSIS — R2232 Localized swelling, mass and lump, left upper limb: Secondary | ICD-10-CM

## 2017-12-14 NOTE — Telephone Encounter (Signed)
Call to patient and let her know that [facility] would be contacting her to schedule her biopsies. We will have her follow up with Dr Hampton Abbot afterwards.

## 2017-12-14 NOTE — Telephone Encounter (Signed)
Spoke with Carol-lyn at [facility] office and she will schedule breast biopsy at [facility] and notify patient of appointment.   I spoke with patient prior to speaking with Carol-lyn to let patient  know we prefer her to have the biopsy done so Dr.Piscoya can discuss results / surgery with her.   Patient agreed she did not want to have to pay copay today if not going to be told anything. I let patient know that she would be contacted today regarding appointment.

## 2017-12-15 ENCOUNTER — Other Ambulatory Visit: Payer: Self-pay | Admitting: Surgery

## 2017-12-15 DIAGNOSIS — N6489 Other specified disorders of breast: Secondary | ICD-10-CM

## 2017-12-21 ENCOUNTER — Ambulatory Visit: Payer: Self-pay | Admitting: Surgery

## 2017-12-21 ENCOUNTER — Ambulatory Visit
Admission: RE | Admit: 2017-12-21 | Discharge: 2017-12-21 | Disposition: A | Discharge status: Home / Self Care | Payer: BC Managed Care – PPO | Source: Ambulatory Visit | Attending: Surgery | Admitting: Surgery

## 2017-12-21 DIAGNOSIS — R2232 Localized swelling, mass and lump, left upper limb: Secondary | ICD-10-CM

## 2017-12-21 DIAGNOSIS — N6489 Other specified disorders of breast: Secondary | ICD-10-CM | POA: Diagnosis not present

## 2017-12-21 HISTORY — PX: BREAST BIOPSY: SHX20

## 2017-12-22 ENCOUNTER — Telehealth: Payer: Self-pay | Admitting: Internal Medicine

## 2017-12-22 NOTE — Telephone Encounter (Signed)
I called patient to let her know that Dr. Nicki Reaper hadn't sent any notes on this as of yet. When she had that I or Jordan Scheuermann, LPN would give her a call.

## 2017-12-22 NOTE — Telephone Encounter (Signed)
Copied from [place] [id number]. Topic: Quick Communication - See Telephone Encounter >> Dec 22, 2017 11:38 AM Sheran Luz wrote: CRM for notification. See Telephone encounter for: 12/22/17.  Patient would like to know if Dr. Nicki Reaper has had a chance to review results from breast biopsy. Please advise.

## 2017-12-23 LAB — SURGICAL PATHOLOGY

## 2017-12-24 ENCOUNTER — Encounter: Payer: Self-pay | Admitting: Internal Medicine

## 2017-12-24 NOTE — Telephone Encounter (Signed)
Pt called Dr. Mont Dutton office and advised that they would go over her results with her on Tuesday. She is on her way to pick up pathology report from [place].

## 2017-12-24 NOTE — Telephone Encounter (Signed)
Please call pt and let her know that path results will go to physician who ordered.  We will try to review.  Also, in reviewing the phone note - it appears that Dr Hampton Abbot was going to contact her with results.  It looks like he ordered biopsy.  Let me know if problems.

## 2017-12-24 NOTE — Telephone Encounter (Signed)
Spoke with patient on the phone. She was very emotional. She says that radiology told her to reach out to our office for results of her biopsy because we are the ones who should be contacting her. Advised that general surgery are usually the ones who complete biopsy and call with results. I apologized for the back and forth communication and also let her know that our office was not the hold up.

## 2017-12-27 ENCOUNTER — Encounter: Payer: Self-pay | Admitting: Internal Medicine

## 2017-12-27 NOTE — Telephone Encounter (Signed)
Patient called back and is very concerned with results ,she says she spoke with the surgeons office this morning and was told again to call he pcp for results . Please call her back

## 2017-12-27 NOTE — Telephone Encounter (Signed)
Tried to call pt multiple times.  Left messages.  Please call her for update after her visit.  Has visit 12/28/17.

## 2017-12-28 ENCOUNTER — Encounter: Payer: Self-pay | Admitting: Surgery

## 2017-12-28 ENCOUNTER — Ambulatory Visit: Payer: Self-pay | Admitting: Surgery

## 2017-12-28 ENCOUNTER — Ambulatory Visit (INDEPENDENT_AMBULATORY_CARE_PROVIDER_SITE_OTHER): Payer: BC Managed Care – PPO | Admitting: Surgery

## 2017-12-28 VITALS — BP 121/81 | HR 98 | Temp 97.9°F | Resp 20 | Ht 68.0 in | Wt 209.8 lb

## 2017-12-28 DIAGNOSIS — N6092 Unspecified benign mammary dysplasia of left breast: Secondary | ICD-10-CM

## 2017-12-28 NOTE — Progress Notes (Signed)
12/28/2017  Reason for Visit:  Left breast mass  Referring Provider:  Einar Pheasant, MD  History of Present Illness: Jordan Benson is a 62 y.o. female with a history of left arm melanoma, who was noted to have an area of abnormality of left breat mammogram as well as abnormal lymph node on left axilla.  Biopsies were done on 11/12.  Pathology showed a normal left lymph node negative for melanoma or carcinoma, and a left breast mass with atypical ductal papillary lesion with adjacent atypical ductal hyperplasia.    Patient reports that she could not feel any masses and denies having any skin changes, nipple changes, drainage, or pain. There is breast cancer history in her mother and a sister.  Past Medical History: Past Medical History:  Diagnosis Date  . Anxiety   . GERD (gastroesophageal reflux disease)   . Hypertension   . IBS (irritable bowel syndrome)   . Melanoma ([place])    followed by Dr Evorn Gong  . Sleep apnea   . Vitamin D deficiency      Past Surgical History: Past Surgical History:  Procedure Laterality Date  . ABDOMINAL HYSTERECTOMY  1992  . BREAST BIOPSY    . BREAST SURGERY    . MOHS SURGERY  2012  . OVARIAN CYST REMOVAL  1991  . SEPTOPLASTY  2009  . TOTAL KNEE 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   Procedure: RIGHT TOTAL KNEE ARTHROPLASTY;  Surgeon: Sydnee Cabal, MD;  Location: [facility];  Service: Orthopedics;  Laterality: Right;  2 hrs  . TUBAL LIGATION  1985  . tummy tuck  1995  . WISDOM TOOTH EXTRACTION      Home Medications: Prior to Admission medications   Medication Sig Start Date End Date Taking? Authorizing Provider  ALPRAZolam (XANAX) 0.25 MG tablet Take 1 tablet (0.25 mg total) by mouth daily as needed. for anxiety 12/09/17  Yes Scott, Randell Patient, MD  amLODipine (NORVASC) 5 MG tablet TAKE 1 TABLET(5 MG) BY MOUTH DAILY( GENERIC EQUIVALENT TO [place]) 09/06/17  Yes Einar Pheasant, MD  amoxicillin (AMOXIL) 500 MG capsule TK 4 CS PO 1 HOUR PTA 12/10/17  Yes  [provider]  enalapril (VASOTEC) 10 MG tablet TAKE 1 TABLET(10 MG) BY MOUTH TWICE DAILY 11/15/17  Yes Einar Pheasant, MD  FLUoxetine (PROZAC) 40 MG capsule TAKE 1 CAPSULE(40 MG) BY MOUTH DAILY 10/18/17  Yes Einar Pheasant, MD  Multiple Vitamins-Calcium (ONE-A-DAY WOMENS PO) Take 1 tablet by mouth daily.   Yes [provider]  triamterene-hydrochlorothiazide (MAXZIDE-25) 37.5-25 MG tablet TAKE 1 TABLET BY MOUTH DAILY 11/24/17  Yes Einar Pheasant, MD    Allergies: No Known Allergies  Social History:  reports that she quit smoking about 17 years ago. Her smoking use included cigarettes. She has a 5.00 pack-year smoking history. She has never used smokeless tobacco. She reports that she drinks about 1.0 standard drinks of alcohol per week. She reports that she does not use drugs.   Family History: Family History  Problem Relation Age of Onset  . Cancer Mother 72       breast cancer  . Heart disease Mother   . Hypertension Mother   . Diabetes Mother   . Breast cancer Mother 14  . Heart disease Father   . Breast cancer Sister 25       on HRT    Review of Systems: Review of Systems  Constitutional: Negative for chills and fever.  Respiratory: Negative for shortness of breath.   Cardiovascular: Negative for chest pain.  Gastrointestinal: Negative for abdominal pain, nausea and vomiting.  Genitourinary: Negative for dysuria.  Musculoskeletal: Negative for myalgias.  Skin: Negative for rash.  Neurological: Negative for dizziness.  Psychiatric/Behavioral: Negative for depression.    Physical Exam BP 121/81   Pulse 98   Temp 97.9 F (36.6 C) (Temporal)   Resp 20   Ht 5\' 8"  (1.727 m)   Wt 209 lb 12.8 oz (95.2 kg)   SpO2 97%   BMI 31.90 kg/m  CONSTITUTIONAL: No acute distress HEENT:  Normocephalic, atraumatic, extraocular motion intact. NECK: Trachea is midline, and there is no jugular venous distension.  RESPIRATORY:  Lungs are clear, and breath sounds  are equal bilaterally. Normal respiratory effort without pathologic use of accessory muscles. CARDIOVASCULAR: Heart is regular without murmurs, gallops, or rubs. BREAST:  Left breast without any palpable masses.  There is a small area of firmness at the biopsy site adjacent to steri strips in the lower inner quadrant consistent with biopsy changes.  Left axilla without palpable masses, with steri strips from biopsy.  No nipple changes or drainage.  Negative exam on the right. GI: The abdomen is soft, nondistended, nontender.  MUSCULOSKELETAL:  Normal muscle strength and tone in all four extremities.  No peripheral edema or cyanosis. SKIN: Skin turgor is normal. There are no pathologic skin lesions.  NEUROLOGIC:  Motor and sensation is grossly normal.  Cranial nerves are grossly intact. PSYCH:  Alert and oriented to person, place and time. Affect is normal.  Laboratory Analysis: Pathology:  DIAGNOSIS:  A. BREAST, LEFT; STEREOTACTIC BIOPSY:  - ATYPICAL DUCTAL PAPILLARY LESION WITH ADJACENT ATYPICAL DUCTAL HYPERPLASIA.  - SEE COMMENT.   B. LYMPH NODE, LEFT AXILLA; STEREOTACTIC BIOPSY:  - NO METASTATIC MELANOMA OR CARCINOMA IDENTIFIED   Imaging: Mammogram and U/S: FINDINGS: The medial left breast asymmetry persists today's study, spanning 13 mm. It is inferiorly located on the MLO view.  Mammographic images were processed with CAD.  On physical exam, no suspicious lumps are identified.  Targeted ultrasound is performed, showing multiple scattered cysts. No definitive correlate for the mammographic finding. There is an abnormal node in the left axilla with a cortex measuring up to 6 mm.  IMPRESSION: Indeterminate asymmetry in the medial left breast. Abnormal left axillary lymph node. Of note, the patient had melanoma in the left arm.  RECOMMENDATION: Recommend stereotactic biopsy of the indeterminate asymmetry in the medial left breast. Recommend ultrasound-guided biopsy of the  abnormal left axillary node with a cortex measuring up to 6 mm.   Assessment and Plan: This is a 62 y.o. female with new diagnosis of left breast atypical ductal hyperplasia.  I have independently viewed the patient's imaging studies and reviewed her pathology reports.  Overall, there's an area of irregularity in the left breast which was biopsied.  Her axillary lymph node was negative.  Discussed with the patient that her lymph node was negative for either melanoma or breast cancer.  Her left breast biopsy does not show cancer, but the findings do increase her risk for eventual left breast cancer significantly, and as such, the recommendation is for excision.  At this point, a lumpectomy with wire localization is appropriate.  Discussed the risks of bleeding, infection, and injury to surrounding structures.  Discussed the risk of potential upgrade of ADH to cancer or DCIS once excision is done, and if so, she may need a sentinel node biopsy.  At this point, the only diagnosis she has is ADH and we will proceed with wire localized  lumpectomy.  She understands this plan and all of her questions have been answered.  She will be scheduled for Wednesday 11/27.  She is aware to not take any Aspirin for 3 days prior to surgery.  Face-to-face time spent with the patient and care providers was 60 minutes, with more than 50% of the time spent counseling, educating, and coordinating care of the patient.     Melvyn Neth, [facility]

## 2017-12-28 NOTE — Patient Instructions (Addendum)
Patient would like to schedule surgery with Dr.Piscoya for left breast lumpectomy. Call the office with any questions or concerns. Lumpectomy A lumpectomy, sometimes called a partial mastectomy, is surgery to remove a cancerous tumor or mass (the lump) from a breast. It is a form of "breast conserving" or "breast preservation" surgery. This means that the cancerous tissue is removed but the breast remains intact. During a lumpectomy, the portion of the breast that contains the tumor is removed. Some normal tissue around the lump may be taken out to make sure that all of the tumor has been removed. Lymph nodes under your arm may also be removed and tested to find out if the cancer has spread. Tell a health care provider about:  Any allergies you have.  All medicines you are taking, including vitamins, herbs, eye drops, creams, and over-the-counter medicines.  Any problems you or family members have had with anesthetic medicines.  Any blood disorders you have.  Any surgeries you have had.  Any medical conditions you have.  Whether you are pregnant or may be pregnant. What are the risks? Generally, this is a safe procedure. However, problems may occur, including:  Bleeding.  Infection.  Pain, swelling, weakness, or numbness in the arm on the side of your surgery.  Temporary swelling.  Change in the shape of the breast, particularly if a large portion is removed.  Scar tissue that forms at the surgical site and feels hard to the touch.  What happens before the procedure? Staying hydrated Follow instructions from your health care provider about hydration, which may include:  Up to 2 hours before the procedure - you may continue to drink clear liquids, such as water, clear fruit juice, black coffee, and plain tea.  Eating and drinking restrictions  Follow instructions from your health care provider about eating and drinking, which may include: ? 8 hours before the procedure -  stop eating heavy meals or foods such as meat, fried foods, or fatty foods. ? 6 hours before the procedure - stop eating light meals or foods, such as toast or cereal. ? 6 hours before the procedure - stop drinking milk or drinks that contain milk. ? 2 hours before the procedure - stop drinking clear liquids. Medicines  Ask your health care provider about: ? Changing or stopping your regular medicines. This is especially important if you are taking diabetes medicines or blood thinners. ? Taking medicines such as aspirin and ibuprofen. These medicines can thin your blood. Do not take these medicines before your procedure if your health care provider instructs you not to.  You may be given antibiotic medicine to help prevent infection. General instructions  Ask your health care provider how your surgical site will be marked or identified.  You may be screened for extra fluid around the lymph nodes (lymphedema).  Plan to have someone take you home from the hospital or clinic.  On the day of surgery, your health care provider will use a mammogram or ultrasound to locate and mark the tumor in your breast. These markings on your breast will show where the incision will be made. What happens during the procedure?  To lower your risk of infection: ? Your health care team will wash or sanitize their hands. ? Your skin will be washed with soap.  An IV tube will be put into one of your veins.  You will be given one or more of the following: ? A medicine to help you relax (sedative). ? A  medicine to numb the area (local anesthetic). ? A medicine to make you fall asleep (general anesthetic).  Your health care provider will use a kind of electric scalpel that uses heat to minimize bleeding (electrocautery knife). A curved incision that follows the natural curve of your breast will be made. This type of incision will allow for minimal scarring and better healing.  The tumor will be removed along  with some of the surrounding tissue. This will be sent to the lab for testing. Your health care provider may also remove lymph nodes at this time if needed.  A small drain tube may be inserted into your breast area or armpit to collect fluid that may build up after surgery. This tube will be connected to a suction bulb on the outside of your body to remove the fluid.  The incision will be closed with stitches (sutures).  A bandage (dressing) may be placed over the incision. The procedure may vary among health care providers and hospitals. What happens after the procedure?  Your blood pressure, heart rate, breathing rate, and blood oxygen level will be monitored until the medicines you were given have worn off.  You will be given medicine for pain.  You may have a drain tube in place for 2-3 days to prevent a collection of blood (hematoma) from developing in the breast. You will be given instructions about caring for the drain before you go home.  A pressure bandage may be applied for 1-2 days to prevent bleeding or swelling. Your pressure bandage may look like a thick piece of fabric or an elastic wrap. Ask your health care provider how to care for your bandage at home.  You may be given a tight sleeve to wear over your arm on the side of your surgery. You should wear this sleeve as told by your health care provider.  Do not drive for 24 hours if you were given a sedative. This information is not intended to replace advice given to you by your health care provider. Make sure you discuss any questions you have with your health care provider. Document Released: 03/09/2006 Document Revised: 10/10/2015 Document Reviewed: 10/09/2015 Elsevier Interactive Patient Education  [employer].  The patient is scheduled for surgery at [facility] with Dr Hampton Abbot on 01/05/18. She will report to the [facility] that day for wire location. She will pre admit by phone. The patient is aware of  date and instructions.

## 2017-12-28 NOTE — H&P (View-Only) (Signed)
12/28/2017  Reason for Visit:  Left breast mass  Referring Provider:  Einar Pheasant, MD  History of Present Illness: Jordan Benson is a 62 y.o. female with a history of left arm melanoma, who was noted to have an area of abnormality of left breat mammogram as well as abnormal lymph node on left axilla.  Biopsies were done on 11/12.  Pathology showed a normal left lymph node negative for melanoma or carcinoma, and a left breast mass with atypical ductal papillary lesion with adjacent atypical ductal hyperplasia.    Patient reports that she could not feel any masses and denies having any skin changes, nipple changes, drainage, or pain. There is breast cancer history in her mother and a sister.  Past Medical History: Past Medical History:  Diagnosis Date  . Anxiety   . GERD (gastroesophageal reflux disease)   . Hypertension   . IBS (irritable bowel syndrome)   . Melanoma ([place])    followed by Dr Evorn Gong  . Sleep apnea   . Vitamin D deficiency      Past Surgical History: Past Surgical History:  Procedure Laterality Date  . ABDOMINAL HYSTERECTOMY  1992  . BREAST BIOPSY    . BREAST SURGERY    . MOHS SURGERY  2012  . OVARIAN CYST REMOVAL  1991  . SEPTOPLASTY  2009  . TOTAL KNEE 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   Procedure: RIGHT TOTAL KNEE ARTHROPLASTY;  Surgeon: Sydnee Cabal, MD;  Location: [facility];  Service: Orthopedics;  Laterality: Right;  2 hrs  . TUBAL LIGATION  1985  . tummy tuck  1995  . WISDOM TOOTH EXTRACTION      Home Medications: Prior to Admission medications   Medication Sig Start Date End Date Taking? Authorizing Provider  ALPRAZolam (XANAX) 0.25 MG tablet Take 1 tablet (0.25 mg total) by mouth daily as needed. for anxiety 12/09/17  Yes Scott, Randell Patient, MD  amLODipine (NORVASC) 5 MG tablet TAKE 1 TABLET(5 MG) BY MOUTH DAILY( GENERIC EQUIVALENT TO [place]) 09/06/17  Yes Einar Pheasant, MD  amoxicillin (AMOXIL) 500 MG capsule TK 4 CS PO 1 HOUR PTA 12/10/17  Yes  [provider]  enalapril (VASOTEC) 10 MG tablet TAKE 1 TABLET(10 MG) BY MOUTH TWICE DAILY 11/15/17  Yes Einar Pheasant, MD  FLUoxetine (PROZAC) 40 MG capsule TAKE 1 CAPSULE(40 MG) BY MOUTH DAILY 10/18/17  Yes Einar Pheasant, MD  Multiple Vitamins-Calcium (ONE-A-DAY WOMENS PO) Take 1 tablet by mouth daily.   Yes [provider]  triamterene-hydrochlorothiazide (MAXZIDE-25) 37.5-25 MG tablet TAKE 1 TABLET BY MOUTH DAILY 11/24/17  Yes Einar Pheasant, MD    Allergies: No Known Allergies  Social History:  reports that she quit smoking about 17 years ago. Her smoking use included cigarettes. She has a 5.00 pack-year smoking history. She has never used smokeless tobacco. She reports that she drinks about 1.0 standard drinks of alcohol per week. She reports that she does not use drugs.   Family History: Family History  Problem Relation Age of Onset  . Cancer Mother 24       breast cancer  . Heart disease Mother   . Hypertension Mother   . Diabetes Mother   . Breast cancer Mother 65  . Heart disease Father   . Breast cancer Sister 3       on HRT    Review of Systems: Review of Systems  Constitutional: Negative for chills and fever.  Respiratory: Negative for shortness of breath.   Cardiovascular: Negative for chest pain.  Gastrointestinal: Negative for abdominal pain, nausea and vomiting.  Genitourinary: Negative for dysuria.  Musculoskeletal: Negative for myalgias.  Skin: Negative for rash.  Neurological: Negative for dizziness.  Psychiatric/Behavioral: Negative for depression.    Physical Exam BP 121/81   Pulse 98   Temp 97.9 F (36.6 C) (Temporal)   Resp 20   Ht 5\' 8"  (1.727 m)   Wt 209 lb 12.8 oz (95.2 kg)   SpO2 97%   BMI 31.90 kg/m  CONSTITUTIONAL: No acute distress HEENT:  Normocephalic, atraumatic, extraocular motion intact. NECK: Trachea is midline, and there is no jugular venous distension.  RESPIRATORY:  Lungs are clear, and breath sounds  are equal bilaterally. Normal respiratory effort without pathologic use of accessory muscles. CARDIOVASCULAR: Heart is regular without murmurs, gallops, or rubs. BREAST:  Left breast without any palpable masses.  There is a small area of firmness at the biopsy site adjacent to steri strips in the lower inner quadrant consistent with biopsy changes.  Left axilla without palpable masses, with steri strips from biopsy.  No nipple changes or drainage.  Negative exam on the right. GI: The abdomen is soft, nondistended, nontender.  MUSCULOSKELETAL:  Normal muscle strength and tone in all four extremities.  No peripheral edema or cyanosis. SKIN: Skin turgor is normal. There are no pathologic skin lesions.  NEUROLOGIC:  Motor and sensation is grossly normal.  Cranial nerves are grossly intact. PSYCH:  Alert and oriented to person, place and time. Affect is normal.  Laboratory Analysis: Pathology:  DIAGNOSIS:  A. BREAST, LEFT; STEREOTACTIC BIOPSY:  - ATYPICAL DUCTAL PAPILLARY LESION WITH ADJACENT ATYPICAL DUCTAL HYPERPLASIA.  - SEE COMMENT.   B. LYMPH NODE, LEFT AXILLA; STEREOTACTIC BIOPSY:  - NO METASTATIC MELANOMA OR CARCINOMA IDENTIFIED   Imaging: Mammogram and U/S: FINDINGS: The medial left breast asymmetry persists today's study, spanning 13 mm. It is inferiorly located on the MLO view.  Mammographic images were processed with CAD.  On physical exam, no suspicious lumps are identified.  Targeted ultrasound is performed, showing multiple scattered cysts. No definitive correlate for the mammographic finding. There is an abnormal node in the left axilla with a cortex measuring up to 6 mm.  IMPRESSION: Indeterminate asymmetry in the medial left breast. Abnormal left axillary lymph node. Of note, the patient had melanoma in the left arm.  RECOMMENDATION: Recommend stereotactic biopsy of the indeterminate asymmetry in the medial left breast. Recommend ultrasound-guided biopsy of the  abnormal left axillary node with a cortex measuring up to 6 mm.   Assessment and Plan: This is a 62 y.o. female with new diagnosis of left breast atypical ductal hyperplasia.  I have independently viewed the patient's imaging studies and reviewed her pathology reports.  Overall, there's an area of irregularity in the left breast which was biopsied.  Her axillary lymph node was negative.  Discussed with the patient that her lymph node was negative for either melanoma or breast cancer.  Her left breast biopsy does not show cancer, but the findings do increase her risk for eventual left breast cancer significantly, and as such, the recommendation is for excision.  At this point, a lumpectomy with wire localization is appropriate.  Discussed the risks of bleeding, infection, and injury to surrounding structures.  Discussed the risk of potential upgrade of ADH to cancer or DCIS once excision is done, and if so, she may need a sentinel node biopsy.  At this point, the only diagnosis she has is ADH and we will proceed with wire localized  lumpectomy.  She understands this plan and all of her questions have been answered.  She will be scheduled for Wednesday 11/27.  She is aware to not take any Aspirin for 3 days prior to surgery.  Face-to-face time spent with the patient and care providers was 60 minutes, with more than 50% of the time spent counseling, educating, and coordinating care of the patient.     Melvyn Neth, [facility]

## 2017-12-28 NOTE — Telephone Encounter (Signed)
Patient received results and good news so far. They are still going to do a lumpectomy to ensure there is no cancer. Patient was out at dinner when I spoke with her.

## 2017-12-28 NOTE — H&P (View-Only) (Signed)
12/28/2017  Reason for Visit:  Left breast mass  Referring Provider:  Einar Pheasant, MD  History of Present Illness: Jordan Benson is a 62 y.o. female with a history of left arm melanoma, who was noted to have an area of abnormality of left breat mammogram as well as abnormal lymph node on left axilla.  Biopsies were done on 11/12.  Pathology showed a normal left lymph node negative for melanoma or carcinoma, and a left breast mass with atypical ductal papillary lesion with adjacent atypical ductal hyperplasia.    Patient reports that she could not feel any masses and denies having any skin changes, nipple changes, drainage, or pain. There is breast cancer history in her mother and a sister.  Past Medical History: Past Medical History:  Diagnosis Date  . Anxiety   . GERD (gastroesophageal reflux disease)   . Hypertension   . IBS (irritable bowel syndrome)   . Melanoma ([place])    followed by Dr Evorn Gong  . Sleep apnea   . Vitamin D deficiency      Past Surgical History: Past Surgical History:  Procedure Laterality Date  . ABDOMINAL HYSTERECTOMY  1992  . BREAST BIOPSY    . BREAST SURGERY    . MOHS SURGERY  2012  . OVARIAN CYST REMOVAL  1991  . SEPTOPLASTY  2009  . TOTAL KNEE 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   Procedure: RIGHT TOTAL KNEE ARTHROPLASTY;  Surgeon: Sydnee Cabal, MD;  Location: [facility];  Service: Orthopedics;  Laterality: Right;  2 hrs  . TUBAL LIGATION  1985  . tummy tuck  1995  . WISDOM TOOTH EXTRACTION      Home Medications: Prior to Admission medications   Medication Sig Start Date End Date Taking? Authorizing Provider  ALPRAZolam (XANAX) 0.25 MG tablet Take 1 tablet (0.25 mg total) by mouth daily as needed. for anxiety 12/09/17  Yes Scott, Randell Patient, MD  amLODipine (NORVASC) 5 MG tablet TAKE 1 TABLET(5 MG) BY MOUTH DAILY( GENERIC EQUIVALENT TO [place]) 09/06/17  Yes Einar Pheasant, MD  amoxicillin (AMOXIL) 500 MG capsule TK 4 CS PO 1 HOUR PTA 12/10/17  Yes  [provider]  enalapril (VASOTEC) 10 MG tablet TAKE 1 TABLET(10 MG) BY MOUTH TWICE DAILY 11/15/17  Yes Einar Pheasant, MD  FLUoxetine (PROZAC) 40 MG capsule TAKE 1 CAPSULE(40 MG) BY MOUTH DAILY 10/18/17  Yes Einar Pheasant, MD  Multiple Vitamins-Calcium (ONE-A-DAY WOMENS PO) Take 1 tablet by mouth daily.   Yes [provider]  triamterene-hydrochlorothiazide (MAXZIDE-25) 37.5-25 MG tablet TAKE 1 TABLET BY MOUTH DAILY 11/24/17  Yes Einar Pheasant, MD    Allergies: No Known Allergies  Social History:  reports that she quit smoking about 17 years ago. Her smoking use included cigarettes. She has a 5.00 pack-year smoking history. She has never used smokeless tobacco. She reports that she drinks about 1.0 standard drinks of alcohol per week. She reports that she does not use drugs.   Family History: Family History  Problem Relation Age of Onset  . Cancer Mother 56       breast cancer  . Heart disease Mother   . Hypertension Mother   . Diabetes Mother   . Breast cancer Mother 20  . Heart disease Father   . Breast cancer Sister 8       on HRT    Review of Systems: Review of Systems  Constitutional: Negative for chills and fever.  Respiratory: Negative for shortness of breath.   Cardiovascular: Negative for chest pain.  Gastrointestinal: Negative for abdominal pain, nausea and vomiting.  Genitourinary: Negative for dysuria.  Musculoskeletal: Negative for myalgias.  Skin: Negative for rash.  Neurological: Negative for dizziness.  Psychiatric/Behavioral: Negative for depression.    Physical Exam BP 121/81   Pulse 98   Temp 97.9 F (36.6 C) (Temporal)   Resp 20   Ht 5\' 8"  (1.727 m)   Wt 209 lb 12.8 oz (95.2 kg)   SpO2 97%   BMI 31.90 kg/m  CONSTITUTIONAL: No acute distress HEENT:  Normocephalic, atraumatic, extraocular motion intact. NECK: Trachea is midline, and there is no jugular venous distension.  RESPIRATORY:  Lungs are clear, and breath sounds  are equal bilaterally. Normal respiratory effort without pathologic use of accessory muscles. CARDIOVASCULAR: Heart is regular without murmurs, gallops, or rubs. BREAST:  Left breast without any palpable masses.  There is a small area of firmness at the biopsy site adjacent to steri strips in the lower inner quadrant consistent with biopsy changes.  Left axilla without palpable masses, with steri strips from biopsy.  No nipple changes or drainage.  Negative exam on the right. GI: The abdomen is soft, nondistended, nontender.  MUSCULOSKELETAL:  Normal muscle strength and tone in all four extremities.  No peripheral edema or cyanosis. SKIN: Skin turgor is normal. There are no pathologic skin lesions.  NEUROLOGIC:  Motor and sensation is grossly normal.  Cranial nerves are grossly intact. PSYCH:  Alert and oriented to person, place and time. Affect is normal.  Laboratory Analysis: Pathology:  DIAGNOSIS:  A. BREAST, LEFT; STEREOTACTIC BIOPSY:  - ATYPICAL DUCTAL PAPILLARY LESION WITH ADJACENT ATYPICAL DUCTAL HYPERPLASIA.  - SEE COMMENT.   B. LYMPH NODE, LEFT AXILLA; STEREOTACTIC BIOPSY:  - NO METASTATIC MELANOMA OR CARCINOMA IDENTIFIED   Imaging: Mammogram and U/S: FINDINGS: The medial left breast asymmetry persists today's study, spanning 13 mm. It is inferiorly located on the MLO view.  Mammographic images were processed with CAD.  On physical exam, no suspicious lumps are identified.  Targeted ultrasound is performed, showing multiple scattered cysts. No definitive correlate for the mammographic finding. There is an abnormal node in the left axilla with a cortex measuring up to 6 mm.  IMPRESSION: Indeterminate asymmetry in the medial left breast. Abnormal left axillary lymph node. Of note, the patient had melanoma in the left arm.  RECOMMENDATION: Recommend stereotactic biopsy of the indeterminate asymmetry in the medial left breast. Recommend ultrasound-guided biopsy of the  abnormal left axillary node with a cortex measuring up to 6 mm.   Assessment and Plan: This is a 62 y.o. female with new diagnosis of left breast atypical ductal hyperplasia.  I have independently viewed the patient's imaging studies and reviewed her pathology reports.  Overall, there's an area of irregularity in the left breast which was biopsied.  Her axillary lymph node was negative.  Discussed with the patient that her lymph node was negative for either melanoma or breast cancer.  Her left breast biopsy does not show cancer, but the findings do increase her risk for eventual left breast cancer significantly, and as such, the recommendation is for excision.  At this point, a lumpectomy with wire localization is appropriate.  Discussed the risks of bleeding, infection, and injury to surrounding structures.  Discussed the risk of potential upgrade of ADH to cancer or DCIS once excision is done, and if so, she may need a sentinel node biopsy.  At this point, the only diagnosis she has is ADH and we will proceed with wire localized  lumpectomy.  She understands this plan and all of her questions have been answered.  She will be scheduled for Wednesday 11/27.  She is aware to not take any Aspirin for 3 days prior to surgery.  Face-to-face time spent with the patient and care providers was 60 minutes, with more than 50% of the time spent counseling, educating, and coordinating care of the patient.     Melvyn Neth, [facility]

## 2017-12-29 ENCOUNTER — Other Ambulatory Visit: Payer: Self-pay | Admitting: Surgery

## 2017-12-29 ENCOUNTER — Telehealth: Payer: Self-pay | Admitting: *Deleted

## 2017-12-29 DIAGNOSIS — N6092 Unspecified benign mammary dysplasia of left breast: Secondary | ICD-10-CM

## 2017-12-29 NOTE — Telephone Encounter (Signed)
Patient's surgery has been scheduled for 01-05-18 at [facility] with Dr. Hampton Abbot.   The patient is aware to arrive day of surgery at 8:20 am and check in at the [facility].   Patient is also aware to be expecting a phone call from the Pre-admission Testing Department to complete a phone interview on 01-04-18 between 9 am and 1 pm.    The patient is aware to call the office should she have further questions.

## 2018-01-04 ENCOUNTER — Other Ambulatory Visit: Payer: Self-pay

## 2018-01-04 ENCOUNTER — Encounter
Admission: RE | Admit: 2018-01-04 | Discharge: 2018-01-04 | Disposition: A | Discharge status: Home / Self Care | Payer: BC Managed Care – PPO | Source: Ambulatory Visit | Attending: Surgery | Admitting: Surgery

## 2018-01-04 ENCOUNTER — Other Ambulatory Visit: Payer: Self-pay | Admitting: Internal Medicine

## 2018-01-04 HISTORY — DX: Headache, unspecified: R51.9

## 2018-01-04 HISTORY — DX: Other complications of anesthesia, initial encounter: T88.59XA

## 2018-01-04 HISTORY — DX: Adverse effect of unspecified anesthetic, initial encounter: T41.45XA

## 2018-01-04 HISTORY — DX: Other specified postprocedural states: R11.2

## 2018-01-04 HISTORY — DX: Nausea with vomiting, unspecified: Z98.890

## 2018-01-04 HISTORY — DX: Headache: R51

## 2018-01-04 MED ORDER — ALPRAZOLAM 0.25 MG PO TABS: 0.2500 mg | ORAL_TABLET | Freq: Every day | ORAL | 0 refills | Status: DC | PRN

## 2018-01-04 MED ORDER — CEFAZOLIN SODIUM-DEXTROSE 2-4 GM/100ML-% IV SOLN
2.0000 g | INTRAVENOUS | Status: AC
  Administered 2018-01-05: 2 g via INTRAVENOUS

## 2018-01-04 NOTE — Pre-Procedure Instructions (Signed)
Echocardiogram 2D complete1/23/2017 [place] Component Name Value Ref Range  LV Ejection Fraction (%) 55   Aortic Valve Stenosis Grade none   Aortic Valve Regurgitation Grade trivial   Aortic Valve Max Velocity (m/s) 1.1 m/sec   Mitral Valve Stenosis Grade none   Mitral Valve Regurgitation Grade mild   Tricuspid Valve Regurgitation Grade mild   Tricuspid Valve Regurgitation Max Velocity (m/s) 2.2 m/sec   Right Ventricle Systolic Pressure (mmHg) 23.3 mmHg   LV End Diastolic Diameter (cm) 4.0 cm  LV End Systolic Diameter (cm) 2.8 cm  LV Septum Wall Thickness (cm) 1.2 cm  LV Posterior Wall Thickness (cm) 1.1 cm  Left Atrium Diameter (cm) 3.8 cm  Result Narrative   CARDIOLOGY DEPARTMENT CARA, THAXTON [id number] [facility] Acct #: [id number] [address] [place], [place] [address] Date: 03/04/2015 11:18 AM  Adult Female Age: 62 yrs  ECHOCARDIOGRAM REPORT Outpatient STUDY:CHEST WALL TAPE: KC::KCWC  ECHO:Yes DOPPLER:YesFILE: MD1:FATH, KENNETH ALAN COLOR:YesCONTRAST:Yes MACHINE:Philips RV BIOPSY:No 3D:No SOUND QLTY:Moderate  MEDIUM:None ___________________________________________________________________________________________   HISTORY:Chest pain REASON:Assess, LV function INDICATION:Heart palpitations [R00.2 (ICD-10-CM)]; ___________________________________________________________________________________________  ECHOCARDIOGRAPHIC MEASUREMENTS 2D DIMENSIONS AORTA ValuesNormal RangeMAIN  PAValuesNormal Range Annulus:1.9 cm[2.1 - 2.5]PA Main:nm* [1.5 - 2.1] Aorta Sin:nm* [2.7 - 3.3] RIGHT VENTRICLE ST Junction:nm* [2.3 - 2.9]RV Base:nm* [ < 4.2] Asc.Aorta:nm* [2.3 - 3.1] RV Mid:nm* [ < 3.5]  LEFT VENTRICLERV Length:nm* [ < 8.6] LVIDd:4.0 cm[3.9 - 5.3] INFERIOR VENA CAVA LVIDs:2.8 cmMax. IVC:nm* [ <= 2.1]  FS:31.6 %[> 25]Min. IVC:nm* SWT:1.2 cm[0.5 - 0.9] ------------------ PWT:1.1 cm[0.5 - 0.9] nm* - not measured  LEFT ATRIUM LA Diam:3.8 cm[2.7 - 3.8] LA A4C Area:nm* [ < 20] LA Volume:nm* [22 - 52] ___________________________________________________________________________________________  ECHOCARDIOGRAPHIC DESCRIPTIONS  AORTIC ROOT Size:Normal Dissection:INDETERM FOR DISSECTION  AORTIC VALVE Leaflets:Tricuspid Morphology:Normal Mobility:Fully mobile  LEFT VENTRICLE Size:NormalAnterior:Normal  Contraction:Normal Lateral:Normal Closest EF:>55% (Estimated)Septal:Normal  LV Masses:No Masses Apical:Normal  LHT:DSKA LVHInferior:Normal Posterior:Normal Dias.FxClass:N/A  MITRAL VALVE Leaflets:NormalMobility:Fully mobile Morphology:Normal  LEFT ATRIUM Size:Normal LA Masses:No masses  MAIN PA Size:Normal  PULMONIC VALVE Leaflets:N/A Morphology:Normal Mobility:Fully mobile  RIGHT VENTRICLE  RV Masses:No  Masses Size:Normal  Free Wall:Normal Contraction:Normal  TRICUSPID VALVE Leaflets:NormalMobility:Fully mobile Morphology:Normal  RIGHT ATRIUM Size:NormalRA Other:None  RA Mass:No masses  PERICARDIUM  Fluid:No effusion  INFERIOR VENACAVA Size:Not seen Not Seen  ____________________________________________________________________  DOPPLER ECHO and OTHER SPECIAL PROCEDURES  Aortic:TRIVIAL ARNo AS 105.0 cm/sec peak vel 4.4 mmHg peak grad   Mitral:MILD MR No MS 3.3 cm^2 by DOPPLER MV Inflow E Vel=65.2 cm/sec MV Annulus E'Vel=6.3 cm/sec E/E'Ratio=10.3  Tricuspid:MILD TR No TS 221.0 cm/sec peak TR vel29.6 mmHg peak RV pressure  Pulmonary:No PR No PS 76.9 cm/sec peak vel2.4 mmHg peak grad    ___________________________________________________________________________________________ INTERPRETATION NORMAL LEFT VENTRICULAR SYSTOLIC FUNCTION WITH MILD LVH MILD VALVULAR REGURGITATION (See above) NO VALVULAR STENOSIS EF >55% Closest EF: >55% (Estimated) LVH: MILD LVH Mitral: MILD MR Tricuspid: MILD TR   ___________________________________________________________________________________________ Electronically signed by: MD Jordan Hawks on 03/05/2015 08:05 AM Performed By: Maurilio Lovely, RDCS Ordering Physician: Bartholome Bill  ___________________________________________________________________________________________  Status Results Details   Unavailable

## 2018-01-04 NOTE — Patient Instructions (Signed)
Your procedure is scheduled on: 01-05-18 Report to [facility] @ 8:20 AM   Remember: Instructions that are not followed completely may result in serious medical risk, up to and including death, or upon the discretion of your surgeon and anesthesiologist your surgery may need to be rescheduled.    _x___ 1. Do not eat food after midnight the night before your procedure. You may drink clear liquids up to 2 hours before you are scheduled to arrive at the hospital for your procedure.  Do not drink clear liquids within 2 hours of your scheduled arrival to the hospital.  Clear liquids include  --Water or Apple juice without pulp  --Clear carbohydrate beverage such as ClearFast or Gatorade  --Black Coffee or Clear Tea (No milk, no creamers, do not add anything to the coffee or Tea   ____Ensure clear carbohydrate drink on the way to the hospital for bariatric patients  ____Ensure clear carbohydrate drink 3 hours before surgery for Dr Dwyane Luo patients if physician instructed.   No gum chewing or hard candies.     __x__ 2. No Alcohol for 24 hours before or after surgery.   __x__3. No Smoking or e-cigarettes for 24 prior to surgery.  Do not use any chewable tobacco products for at least 6 hour prior to surgery   ____  4. Bring all medications with you on the day of surgery if instructed.    __x__ 5. Notify your doctor if there is any change in your medical condition     (cold, fever, infections).    x___6. On the morning of surgery brush your teeth with toothpaste and water.  You may rinse your mouth with mouth wash if you wish.  Do not swallow any toothpaste or mouthwash.   Do not wear jewelry, make-up, hairpins, clips or nail polish.  Do not wear lotions, powders, or perfumes. You may wear deodorant.  Do not shave 48 hours prior to surgery. Men may shave face and neck.  Do not bring valuables to the hospital.    [facility] is not responsible for any belongings or valuables.    Contacts, dentures or bridgework may not be worn into surgery.  Leave your suitcase in the car. After surgery it may be brought to your room.  For patients admitted to the hospital, discharge time is determined by your treatment team.  _  Patients discharged the day of surgery will not be allowed to drive home.  You will need someone to drive you home and stay with you the night of your procedure.    Please read over the following fact sheets that you were given:   [facility] Preparing for Surgery   _x___ TAKE THE FOLLOWING MEDICATION THE MORNING OF SURGERY WITH A SMALL SIP OF WATER. These include:  1. PROZAC  2. AMLODIPINE  3. YOU MAY TAKE YOUR XANAX DAY OF SURGERY IF NEEDED  4.  5.  6.  ____Fleets enema or Magnesium Citrate as directed.   ____ Use CHG Soap or sage wipes as directed on instruction sheet   ____ Use inhalers on the day of surgery and bring to hospital day of surgery  ____ Stop Metformin and Janumet 2 days prior to surgery.    ____ Take 1/2 of usual insulin dose the night before surgery and none on the morning surgery.   ____ Follow recommendations from Cardiologist, Pulmonologist or PCP regarding stopping Aspirin, Coumadin, Plavix ,Eliquis, Effient, or Pradaxa, and Pletal.  ____Stop Anti-inflammatories such as Advil, Aleve, Ibuprofen,  Motrin, Naproxen, Naprosyn, Goodies powders or aspirin products. OK to take Tylenol    ____ Stop supplements until after surgery.     ____ Bring C-Pap to the hospital.

## 2018-01-05 ENCOUNTER — Ambulatory Visit
Admission: RE | Admit: 2018-01-05 | Discharge: 2018-01-05 | Disposition: A | Discharge status: Home / Self Care | Payer: BC Managed Care – PPO | Source: Ambulatory Visit | Attending: Surgery | Admitting: Surgery

## 2018-01-05 ENCOUNTER — Ambulatory Visit: Payer: BC Managed Care – PPO | Admitting: Certified Registered"

## 2018-01-05 ENCOUNTER — Other Ambulatory Visit: Payer: Self-pay | Admitting: Surgery

## 2018-01-05 ENCOUNTER — Encounter: Payer: Self-pay | Admitting: Anesthesiology

## 2018-01-05 ENCOUNTER — Encounter
Admission: RE | Disposition: A | Discharge status: Home / Self Care | Payer: Self-pay | Source: Ambulatory Visit | Attending: Surgery

## 2018-01-05 DIAGNOSIS — N6092 Unspecified benign mammary dysplasia of left breast: Secondary | ICD-10-CM

## 2018-01-05 DIAGNOSIS — F419 Anxiety disorder, unspecified: Secondary | ICD-10-CM | POA: Diagnosis not present

## 2018-01-05 DIAGNOSIS — Z79899 Other long term (current) drug therapy: Secondary | ICD-10-CM | POA: Insufficient documentation

## 2018-01-05 DIAGNOSIS — Z85820 Personal history of malignant melanoma of skin: Secondary | ICD-10-CM | POA: Diagnosis not present

## 2018-01-05 DIAGNOSIS — I10 Essential (primary) hypertension: Secondary | ICD-10-CM | POA: Diagnosis not present

## 2018-01-05 DIAGNOSIS — Z803 Family history of malignant neoplasm of breast: Secondary | ICD-10-CM | POA: Insufficient documentation

## 2018-01-05 DIAGNOSIS — Z87891 Personal history of nicotine dependence: Secondary | ICD-10-CM | POA: Diagnosis not present

## 2018-01-05 DIAGNOSIS — G4730 Sleep apnea, unspecified: Secondary | ICD-10-CM | POA: Diagnosis not present

## 2018-01-05 DIAGNOSIS — D242 Benign neoplasm of left breast: Secondary | ICD-10-CM | POA: Diagnosis not present

## 2018-01-05 HISTORY — PX: BREAST LUMPECTOMY WITH NEEDLE LOCALIZATION: SHX5759

## 2018-01-05 HISTORY — PX: BREAST LUMPECTOMY: SHX2

## 2018-01-05 HISTORY — DX: Malignant neoplasm of unspecified site of unspecified female breast: C50.919

## 2018-01-05 LAB — POCT I-STAT 4, (NA,K, GLUC, HGB,HCT)
Glucose, Bld: 98 mg/dL (ref 70–99)
HEMATOCRIT: 40 % (ref 36.0–46.0)
HEMOGLOBIN: 13.6 g/dL (ref 12.0–15.0)
POTASSIUM: 3.8 mmol/L (ref 3.5–5.1)
Sodium: 140 mmol/L (ref 135–145)

## 2018-01-05 SURGERY — BREAST LUMPECTOMY WITH NEEDLE LOCALIZATION
Anesthesia: General | Site: Breast | Laterality: Left

## 2018-01-05 MED ORDER — FENTANYL CITRATE (PF) 100 MCG/2ML IJ SOLN: 25.0000 ug | INTRAMUSCULAR | Status: DC | PRN

## 2018-01-05 MED ORDER — OXYCODONE HCL 5 MG PO TABS: 5.0000 mg | ORAL_TABLET | Freq: Four times a day (QID) | ORAL | 0 refills | Status: DC | PRN

## 2018-01-05 MED ORDER — ONDANSETRON HCL 4 MG/2ML IJ SOLN
INTRAMUSCULAR | Status: AC
  Filled 2018-01-05: qty 2

## 2018-01-05 MED ORDER — ONDANSETRON HCL 4 MG/2ML IJ SOLN
INTRAMUSCULAR | Status: DC | PRN
  Administered 2018-01-05: 4 mg via INTRAVENOUS

## 2018-01-05 MED ORDER — FAMOTIDINE 20 MG PO TABS
ORAL_TABLET | ORAL | Status: AC
  Administered 2018-01-05: 20 mg via ORAL
  Filled 2018-01-05: qty 1

## 2018-01-05 MED ORDER — BUPIVACAINE-EPINEPHRINE 0.5% -1:200000 IJ SOLN
INTRAMUSCULAR | Status: DC | PRN
  Administered 2018-01-05: 20 mL

## 2018-01-05 MED ORDER — MIDAZOLAM HCL 2 MG/2ML IJ SOLN
INTRAMUSCULAR | Status: AC
  Filled 2018-01-05: qty 2

## 2018-01-05 MED ORDER — CHLORHEXIDINE GLUCONATE CLOTH 2 % EX PADS
6.0000 | MEDICATED_PAD | Freq: Once | CUTANEOUS | Status: AC
  Administered 2018-01-05: 6 via TOPICAL

## 2018-01-05 MED ORDER — DEXAMETHASONE SODIUM PHOSPHATE 10 MG/ML IJ SOLN
INTRAMUSCULAR | Status: AC
  Filled 2018-01-05: qty 1

## 2018-01-05 MED ORDER — EPHEDRINE SULFATE 50 MG/ML IJ SOLN
INTRAMUSCULAR | Status: DC | PRN
  Administered 2018-01-05: 5 mg via INTRAVENOUS
  Administered 2018-01-05: 10 mg via INTRAVENOUS

## 2018-01-05 MED ORDER — IBUPROFEN 600 MG PO TABS: 600.0000 mg | ORAL_TABLET | Freq: Three times a day (TID) | ORAL | 0 refills | Status: DC | PRN

## 2018-01-05 MED ORDER — DEXAMETHASONE SODIUM PHOSPHATE 10 MG/ML IJ SOLN
INTRAMUSCULAR | Status: DC | PRN
  Administered 2018-01-05: 10 mg via INTRAVENOUS

## 2018-01-05 MED ORDER — LIDOCAINE HCL (CARDIAC) PF 100 MG/5ML IV SOSY
PREFILLED_SYRINGE | INTRAVENOUS | Status: DC | PRN
  Administered 2018-01-05: 100 mg via INTRAVENOUS

## 2018-01-05 MED ORDER — GLYCOPYRROLATE 0.2 MG/ML IJ SOLN
INTRAMUSCULAR | Status: AC
  Filled 2018-01-05: qty 1

## 2018-01-05 MED ORDER — GLYCOPYRROLATE 0.2 MG/ML IJ SOLN
INTRAMUSCULAR | Status: DC | PRN
  Administered 2018-01-05: 0.2 mg via INTRAVENOUS

## 2018-01-05 MED ORDER — FAMOTIDINE 20 MG PO TABS
20.0000 mg | ORAL_TABLET | Freq: Once | ORAL | Status: AC
  Administered 2018-01-05: 20 mg via ORAL

## 2018-01-05 MED ORDER — ACETAMINOPHEN 500 MG PO TABS
1000.0000 mg | ORAL_TABLET | ORAL | Status: AC
  Administered 2018-01-05: 1000 mg via ORAL

## 2018-01-05 MED ORDER — CHLORHEXIDINE GLUCONATE CLOTH 2 % EX PADS: 6.0000 | MEDICATED_PAD | Freq: Once | CUTANEOUS | Status: DC

## 2018-01-05 MED ORDER — ISOSULFAN BLUE 1 % ~~LOC~~ SOLN
SUBCUTANEOUS | Status: AC
  Filled 2018-01-05: qty 5

## 2018-01-05 MED ORDER — METHYLENE BLUE 0.5 % INJ SOLN
INTRAVENOUS | Status: AC
  Filled 2018-01-05: qty 10

## 2018-01-05 MED ORDER — LIDOCAINE HCL (PF) 2 % IJ SOLN
INTRAMUSCULAR | Status: AC
  Filled 2018-01-05: qty 10

## 2018-01-05 MED ORDER — GABAPENTIN 300 MG PO CAPS
ORAL_CAPSULE | ORAL | Status: AC
  Administered 2018-01-05: 300 mg via ORAL
  Filled 2018-01-05: qty 1

## 2018-01-05 MED ORDER — FENTANYL CITRATE (PF) 100 MCG/2ML IJ SOLN
INTRAMUSCULAR | Status: AC
  Filled 2018-01-05: qty 2

## 2018-01-05 MED ORDER — ACETAMINOPHEN 500 MG PO TABS
ORAL_TABLET | ORAL | Status: AC
  Administered 2018-01-05: 1000 mg via ORAL
  Filled 2018-01-05: qty 2

## 2018-01-05 MED ORDER — PROPOFOL 10 MG/ML IV BOLUS
INTRAVENOUS | Status: DC | PRN
  Administered 2018-01-05: 120 mg via INTRAVENOUS

## 2018-01-05 MED ORDER — FENTANYL CITRATE (PF) 100 MCG/2ML IJ SOLN
INTRAMUSCULAR | Status: DC | PRN
  Administered 2018-01-05 (×6): 25 ug via INTRAVENOUS
  Administered 2018-01-05: 50 ug via INTRAVENOUS

## 2018-01-05 MED ORDER — MIDAZOLAM HCL 2 MG/2ML IJ SOLN
INTRAMUSCULAR | Status: DC | PRN
  Administered 2018-01-05: 2 mg via INTRAVENOUS

## 2018-01-05 MED ORDER — ONDANSETRON HCL 4 MG/2ML IJ SOLN: 4.0000 mg | Freq: Once | INTRAMUSCULAR | Status: DC | PRN

## 2018-01-05 MED ORDER — PROPOFOL 10 MG/ML IV BOLUS
INTRAVENOUS | Status: AC
  Filled 2018-01-05: qty 20

## 2018-01-05 MED ORDER — LACTATED RINGERS IV SOLN
INTRAVENOUS | Status: DC
  Administered 2018-01-05: 50 mL/h via INTRAVENOUS
  Administered 2018-01-05: 14:00:00 via INTRAVENOUS

## 2018-01-05 MED ORDER — DEXMEDETOMIDINE HCL 200 MCG/2ML IV SOLN
INTRAVENOUS | Status: DC | PRN
  Administered 2018-01-05: 4 ug via INTRAVENOUS
  Administered 2018-01-05 (×2): 8 ug via INTRAVENOUS

## 2018-01-05 MED ORDER — CEFAZOLIN SODIUM-DEXTROSE 2-4 GM/100ML-% IV SOLN
INTRAVENOUS | Status: AC
  Filled 2018-01-05: qty 100

## 2018-01-05 MED ORDER — EPINEPHRINE PF 1 MG/ML IJ SOLN
INTRAMUSCULAR | Status: AC
  Filled 2018-01-05: qty 1

## 2018-01-05 MED ORDER — BUPIVACAINE HCL (PF) 0.5 % IJ SOLN
INTRAMUSCULAR | Status: AC
  Filled 2018-01-05: qty 30

## 2018-01-05 MED ORDER — GABAPENTIN 300 MG PO CAPS
300.0000 mg | ORAL_CAPSULE | ORAL | Status: AC
  Administered 2018-01-05: 300 mg via ORAL

## 2018-01-05 SURGICAL SUPPLY — 41 items
BINDER BREAST XLRG (GAUZE/BANDAGES/DRESSINGS) ×3 IMPLANT
BLADE PHOTON ILLUMINATED (MISCELLANEOUS) IMPLANT
BLADE SURG 15 STRL LF DISP TIS (BLADE) ×2 IMPLANT
BLADE SURG 15 STRL SS (BLADE) ×4
CANISTER SUCT 1200ML W/VALVE (MISCELLANEOUS) ×3 IMPLANT
CHLORAPREP W/TINT 26ML (MISCELLANEOUS) ×3 IMPLANT
CLIP VESOCCLUDE SM WIDE 6/CT (CLIP) ×3 IMPLANT
CNTNR SPEC 2.5X3XGRAD LEK (MISCELLANEOUS) ×1
CONT SPEC 4OZ STER OR WHT (MISCELLANEOUS) ×2
CONTAINER SPEC 2.5X3XGRAD LEK (MISCELLANEOUS) ×1 IMPLANT
COVER PROBE FLX POLY STRL (MISCELLANEOUS) ×3 IMPLANT
COVER WAND RF STERILE (DRAPES) ×3 IMPLANT
DERMABOND ADVANCED (GAUZE/BANDAGES/DRESSINGS) ×2
DERMABOND ADVANCED .7 DNX12 (GAUZE/BANDAGES/DRESSINGS) ×1 IMPLANT
DEVICE DUBIN SPECIMEN MAMMOGRA (MISCELLANEOUS) ×3 IMPLANT
DRAPE LAPAROTOMY 100X77 ABD (DRAPES) ×3 IMPLANT
DRSG GAUZE FLUFF 36X18 (GAUZE/BANDAGES/DRESSINGS) ×3 IMPLANT
ELECT CAUTERY BLADE 6.4 (BLADE) ×3 IMPLANT
ELECT REM PT RETURN 9FT ADLT (ELECTROSURGICAL) ×3
ELECTRODE REM PT RTRN 9FT ADLT (ELECTROSURGICAL) ×1 IMPLANT
GLOVE SURG SYN 7.0 (GLOVE) ×3 IMPLANT
GLOVE SURG SYN 7.5  E (GLOVE) ×2
GLOVE SURG SYN 7.5 E (GLOVE) ×1 IMPLANT
GOWN STRL REUS W/ TWL LRG LVL3 (GOWN DISPOSABLE) ×2 IMPLANT
GOWN STRL REUS W/TWL LRG LVL3 (GOWN DISPOSABLE) ×4
KIT TURNOVER KIT A (KITS) ×3 IMPLANT
LABEL OR SOLS (LABEL) ×3 IMPLANT
NEEDLE HYPO 22GX1.5 SAFETY (NEEDLE) ×3 IMPLANT
NEEDLE HYPO 25X1 1.5 SAFETY (NEEDLE) ×3 IMPLANT
PACK BASIN MINOR ARMC (MISCELLANEOUS) ×3 IMPLANT
SUT ETHILON 3-0 FS-10 30 BLK (SUTURE) ×3
SUT MNCRL 4-0 (SUTURE) ×4
SUT MNCRL 4-0 27XMFL (SUTURE) ×2
SUT SILK 3 0 SH 30 (SUTURE) ×3 IMPLANT
SUT VIC AB 3-0 SH 27 (SUTURE) ×4
SUT VIC AB 3-0 SH 27X BRD (SUTURE) ×2 IMPLANT
SUTURE EHLN 3-0 FS-10 30 BLK (SUTURE) ×1 IMPLANT
SUTURE MNCRL 4-0 27XMF (SUTURE) ×2 IMPLANT
SYR 10ML LL (SYRINGE) ×3 IMPLANT
SYR BULB IRRIG 60ML STRL (SYRINGE) ×3 IMPLANT
WATER STERILE IRR 1000ML POUR (IV SOLUTION) ×3 IMPLANT

## 2018-01-05 NOTE — Anesthesia Preprocedure Evaluation (Signed)
Anesthesia Evaluation  Patient identified by MRN, date of birth, ID band Patient awake    Reviewed: Allergy & Precautions, NPO status , Patient's Chart, lab work & pertinent test results, reviewed documented beta blocker date and time   History of Anesthesia Complications (+) PONV and history of anesthetic complications  Airway Mallampati: III  TM Distance: >3 FB     Dental  (+) Chipped   Pulmonary former smoker,           Cardiovascular hypertension, Pt. on medications      Neuro/Psych  Headaches, Anxiety    GI/Hepatic GERD  Controlled,  Endo/Other    Renal/GU      Musculoskeletal   Abdominal   Peds  Hematology   Anesthesia Other Findings 0bese.EKG ok. Hb 11.2.  Reproductive/Obstetrics                             Anesthesia Physical Anesthesia Plan  ASA: III  Anesthesia Plan: General   Post-op Pain Management:    Induction: Intravenous  PONV Risk Score and Plan:   Airway Management Planned: LMA  Additional Equipment:   Intra-op Plan:   Post-operative Plan:   Informed Consent: I have reviewed the patients History and Physical, chart, labs and discussed the procedure including the risks, benefits and alternatives for the proposed anesthesia with the patient or authorized representative who has indicated his/her understanding and acceptance.     Plan Discussed with: CRNA  Anesthesia Plan Comments:         Anesthesia Quick Evaluation

## 2018-01-05 NOTE — Anesthesia Post-op Follow-up Note (Signed)
Anesthesia QCDR form completed.        

## 2018-01-05 NOTE — Anesthesia Procedure Notes (Signed)
Procedure Name: LMA Insertion Date/Time: 01/05/2018 12:32 PM Performed by: Lavone Orn, CRNA Pre-anesthesia Checklist: Patient identified, Emergency Drugs available, Suction available, Patient being monitored and Timeout performed Patient Re-evaluated:Patient Re-evaluated prior to induction Oxygen Delivery Method: Circle system utilized Preoxygenation: Pre-oxygenation with 100% oxygen Induction Type: IV induction Ventilation: Mask ventilation without difficulty LMA: LMA inserted LMA Size: 4.0 Number of attempts: 1 Placement Confirmation: positive ETCO2 and breath sounds checked- equal and bilateral Tube secured with: Tape Dental Injury: Teeth and Oropharynx as per pre-operative assessment

## 2018-01-05 NOTE — Interval H&P Note (Signed)
History and Physical Interval Note:  01/05/2018 11:59 AM  Jordan Benson  has presented today for surgery, with the diagnosis of LEFT BREAST ADH  The various methods of treatment have been discussed with the patient and family. After consideration of risks, benefits and other options for treatment, the patient has consented to  Procedure(s): BREAST LUMPECTOMY WITH NEEDLE LOCALIZATION (Left) as a surgical intervention .  The patient's history has been reviewed, patient examined, no change in status, stable for surgery.  I have reviewed the patient's chart and labs.  Questions were answered to the patient's satisfaction.     Shonna Deiter

## 2018-01-05 NOTE — OR Nursing (Signed)
Discharge instructions discussed with pt and friend.both voice understanding.

## 2018-01-05 NOTE — Discharge Instructions (Signed)

## 2018-01-05 NOTE — Transfer of Care (Signed)
Immediate Anesthesia Transfer of Care Note  Patient: Jordan Benson  Procedure(s) Performed: BREAST LUMPECTOMY WITH NEEDLE LOCALIZATION (Left Breast)  Patient Location: PACU  Anesthesia Type:General  Level of Consciousness: drowsy  Airway & Oxygen Therapy: Patient Spontanous Breathing and Patient connected to face mask  Post-op Assessment: Report given to RN and Post -op Vital signs reviewed and stable  Post vital signs: stable  Last Vitals:  Vitals Value Taken Time  BP 156/62 01/05/2018  2:05 PM  Temp 36.8 C 01/05/2018  2:05 PM  Pulse 99 01/05/2018  2:09 PM  Resp 14 01/05/2018  2:09 PM  SpO2 100 % 01/05/2018  2:09 PM  Vitals shown include unvalidated device data.  Last Pain:  Vitals:   01/05/18 1405  PainSc: Asleep         Complications: No apparent anesthesia complications

## 2018-01-05 NOTE — Op Note (Signed)
  Procedure Date:  01/05/2018  Pre-operative Diagnosis:  Left breast atypical ductal hyperplasia  Post-operative Diagnosis:   Left breast atypical ductal hyperplasia  Procedure:  Left breast wire-localized lumpectomy  Surgeon:  Melvyn Neth, MD  Anesthesia:  General endotracheal  Estimated Blood Loss:  5 ml  Specimens:  Left breast wire localized mass, left breast additional medial margin  Complications:  None  Indications for Procedure:  This is a 62 y.o. female who presents with left breast atypical ductal hyperplasia.  The risks of bleeding, infection, injury to surrounding structures, hematoma, seroma, open wound, cosmetic deformity, and the need for further surgery were all discussed with the patient and was willing to proceed.  Prior to this procedure, the patient had undergone wire localization.  Description of Procedure: The patient was correctly identified in the preoperative area and brought into the operating room.  The patient was placed supine with VTE prophylaxis in place.  Appropriate time-outs were performed.  Anesthesia was induced and the patient was intubated.  Appropriate antibiotics were infused.  Attention was turned to the needle localization site where an incision was made encompassing the needle insertion site. Elecrocautery was used for dissection around the needle to perform a lumpectomy.  2-0 silk suture was used to mark the specimen short superior and long medial.  The specimen including the wire was sent to radiology which confirmed an intact wire and prior biopsy site clip.  However, the clip was right at the edge of the medial portion of the specimen, so an additional medial margin was resected, and labeled short superior and long medial.  This was sent to pathology.  The cavity was irrigated and hemostasis was assured with electrocautery.  Local anesthetic was infiltrated into the skin and subcutaneous tissue of the cavity.  The wound was then closed in  two layers with 3-0 Vicryl and 4-0 Monocryl and sealed with DermaBond.  The patient was emerged from anesthesia and extubated and brought to the recovery room for further management.  The patient tolerated the procedure well and all counts were correct at the end of the case.   Melvyn Neth, MD

## 2018-01-07 ENCOUNTER — Telehealth: Payer: Self-pay | Admitting: Surgery

## 2018-01-07 LAB — SURGICAL PATHOLOGY

## 2018-01-07 NOTE — Addendum Note (Signed)
Addended by: Riki Sheer on: 01/07/2018 04:27 PM   Modules accepted: Orders, SmartSet

## 2018-01-07 NOTE — Telephone Encounter (Signed)
01/07/18  Called the patient today to update her on the pathology results following her left breast wire localized lumpectomy.  It appears that the biopsy site changes and ADH extend to the anteromedial margin of the specimen sent.  I sent in the OR an additional medial margin but there are no changes on that one, and it cannot be definitively said that the entire mass was excised.  As such, I discussed with the patient that we would need to go back to the OR for excision of additional margin.  Discussed with her that we would do this for Wednesday, December 4th, in the afternoon.  She understands this and she wants to proceed with re-excision to make sure all margins are fully clear.  Otherwise, she reports that she's healing well without any pain or issues with the incision.  Orders will be placed for her surgery and have left voicemail on posting line for scheduling the case.  Olean Ree, MD

## 2018-01-10 ENCOUNTER — Telehealth: Payer: Self-pay | Admitting: *Deleted

## 2018-01-10 NOTE — Anesthesia Postprocedure Evaluation (Signed)
Anesthesia Post Note  Patient: Jordan Benson  Procedure(s) Performed: BREAST LUMPECTOMY WITH NEEDLE LOCALIZATION (Left Breast)  Patient location during evaluation: PACU Anesthesia Type: General Level of consciousness: awake and alert Pain management: pain level controlled Vital Signs Assessment: post-procedure vital signs reviewed and stable Respiratory status: spontaneous breathing, nonlabored ventilation, respiratory function stable and patient connected to nasal cannula oxygen Cardiovascular status: blood pressure returned to baseline and stable Postop Assessment: no apparent nausea or vomiting Anesthetic complications: no     Last Vitals:  Vitals:   01/05/18 1455 01/05/18 1529  BP: 117/62 (!) 123/53  Pulse: 91   Resp: 16 16  Temp:    SpO2: 97%     Last Pain:  Vitals:   01/07/18 0930  PainSc: 0-No pain                 Durenda Hurt

## 2018-01-10 NOTE — Telephone Encounter (Signed)
Dr. Hampton Abbot scheduled patient for surgery on 01-12-18 per Leah in the O.R.   Message left on patient's cell phone to call the office.   I wanted to follow up with the patient to make sure she knew to call [facility] tomorrow to find out her arrival time, reminder that she will need a driver, be NPO after midnight, and make sure she knows to check-in at the [place] entrance, 2nd floor-Same Day Surgery.

## 2018-01-11 NOTE — Telephone Encounter (Signed)
Patient notified of instructions and where to report to.

## 2018-01-12 ENCOUNTER — Ambulatory Visit: Payer: BC Managed Care – PPO | Admitting: Certified Registered Nurse Anesthetist

## 2018-01-12 ENCOUNTER — Encounter: Payer: Self-pay | Admitting: Emergency Medicine

## 2018-01-12 ENCOUNTER — Ambulatory Visit
Admission: RE | Admit: 2018-01-12 | Discharge: 2018-01-12 | Disposition: A | Discharge status: Home / Self Care | Payer: BC Managed Care – PPO | Source: Ambulatory Visit | Attending: Surgery | Admitting: Surgery

## 2018-01-12 ENCOUNTER — Other Ambulatory Visit: Payer: Self-pay

## 2018-01-12 ENCOUNTER — Encounter
Admission: RE | Disposition: A | Discharge status: Home / Self Care | Payer: Self-pay | Source: Ambulatory Visit | Attending: Surgery

## 2018-01-12 DIAGNOSIS — Z803 Family history of malignant neoplasm of breast: Secondary | ICD-10-CM | POA: Insufficient documentation

## 2018-01-12 DIAGNOSIS — G4730 Sleep apnea, unspecified: Secondary | ICD-10-CM | POA: Insufficient documentation

## 2018-01-12 DIAGNOSIS — F419 Anxiety disorder, unspecified: Secondary | ICD-10-CM | POA: Diagnosis not present

## 2018-01-12 DIAGNOSIS — N6092 Unspecified benign mammary dysplasia of left breast: Secondary | ICD-10-CM | POA: Diagnosis present

## 2018-01-12 DIAGNOSIS — Z79899 Other long term (current) drug therapy: Secondary | ICD-10-CM | POA: Insufficient documentation

## 2018-01-12 DIAGNOSIS — I10 Essential (primary) hypertension: Secondary | ICD-10-CM | POA: Insufficient documentation

## 2018-01-12 DIAGNOSIS — Z87891 Personal history of nicotine dependence: Secondary | ICD-10-CM | POA: Insufficient documentation

## 2018-01-12 DIAGNOSIS — Z85820 Personal history of malignant melanoma of skin: Secondary | ICD-10-CM | POA: Insufficient documentation

## 2018-01-12 DIAGNOSIS — Z8249 Family history of ischemic heart disease and other diseases of the circulatory system: Secondary | ICD-10-CM | POA: Insufficient documentation

## 2018-01-12 DIAGNOSIS — N6489 Other specified disorders of breast: Secondary | ICD-10-CM | POA: Insufficient documentation

## 2018-01-12 HISTORY — PX: BREAST LUMPECTOMY: SHX2

## 2018-01-12 HISTORY — PX: RE-EXCISION OF BREAST LUMPECTOMY: SHX6048

## 2018-01-12 SURGERY — EXCISION, LESION, BREAST
Anesthesia: General | Laterality: Left

## 2018-01-12 MED ORDER — LIDOCAINE HCL (PF) 2 % IJ SOLN
INTRAMUSCULAR | Status: AC
  Filled 2018-01-12: qty 10

## 2018-01-12 MED ORDER — PROPOFOL 10 MG/ML IV BOLUS
INTRAVENOUS | Status: AC
  Filled 2018-01-12: qty 20

## 2018-01-12 MED ORDER — FAMOTIDINE 20 MG PO TABS
20.0000 mg | ORAL_TABLET | Freq: Once | ORAL | Status: AC
  Administered 2018-01-12: 20 mg via ORAL

## 2018-01-12 MED ORDER — CEFAZOLIN SODIUM-DEXTROSE 2-4 GM/100ML-% IV SOLN
2.0000 g | INTRAVENOUS | Status: AC
  Administered 2018-01-12: 2 g via INTRAVENOUS

## 2018-01-12 MED ORDER — BUPIVACAINE HCL (PF) 0.5 % IJ SOLN
INTRAMUSCULAR | Status: DC | PRN
  Administered 2018-01-12: 30 mL

## 2018-01-12 MED ORDER — ACETAMINOPHEN 500 MG PO TABS
ORAL_TABLET | ORAL | Status: AC
  Administered 2018-01-12: 1000 mg via ORAL
  Filled 2018-01-12: qty 2

## 2018-01-12 MED ORDER — ONDANSETRON HCL 4 MG/2ML IJ SOLN: 4.0000 mg | Freq: Once | INTRAMUSCULAR | Status: DC | PRN

## 2018-01-12 MED ORDER — ONDANSETRON HCL 4 MG/2ML IJ SOLN
INTRAMUSCULAR | Status: AC
  Filled 2018-01-12: qty 2

## 2018-01-12 MED ORDER — CEFAZOLIN SODIUM-DEXTROSE 2-4 GM/100ML-% IV SOLN
INTRAVENOUS | Status: AC
  Filled 2018-01-12: qty 100

## 2018-01-12 MED ORDER — DEXAMETHASONE SODIUM PHOSPHATE 10 MG/ML IJ SOLN
INTRAMUSCULAR | Status: DC | PRN
  Administered 2018-01-12: 10 mg via INTRAVENOUS

## 2018-01-12 MED ORDER — SEVOFLURANE IN SOLN
RESPIRATORY_TRACT | Status: AC
  Filled 2018-01-12: qty 250

## 2018-01-12 MED ORDER — PROPOFOL 500 MG/50ML IV EMUL
INTRAVENOUS | Status: DC | PRN
  Administered 2018-01-12: 25 ug/kg/min via INTRAVENOUS

## 2018-01-12 MED ORDER — MIDAZOLAM HCL 2 MG/2ML IJ SOLN
INTRAMUSCULAR | Status: AC
  Filled 2018-01-12: qty 2

## 2018-01-12 MED ORDER — DEXAMETHASONE SODIUM PHOSPHATE 10 MG/ML IJ SOLN
INTRAMUSCULAR | Status: AC
  Filled 2018-01-12: qty 1

## 2018-01-12 MED ORDER — PHENYLEPHRINE HCL 10 MG/ML IJ SOLN
INTRAMUSCULAR | Status: DC | PRN
  Administered 2018-01-12: 100 ug via INTRAVENOUS
  Administered 2018-01-12: 200 ug via INTRAVENOUS

## 2018-01-12 MED ORDER — ONDANSETRON HCL 4 MG/2ML IJ SOLN
INTRAMUSCULAR | Status: DC | PRN
  Administered 2018-01-12: 4 mg via INTRAVENOUS

## 2018-01-12 MED ORDER — FENTANYL CITRATE (PF) 100 MCG/2ML IJ SOLN
INTRAMUSCULAR | Status: DC | PRN
  Administered 2018-01-12: 50 ug via INTRAVENOUS
  Administered 2018-01-12: 35 ug via INTRAVENOUS
  Administered 2018-01-12: 50 ug via INTRAVENOUS
  Administered 2018-01-12: 65 ug via INTRAVENOUS

## 2018-01-12 MED ORDER — LIDOCAINE HCL (CARDIAC) PF 100 MG/5ML IV SOSY
PREFILLED_SYRINGE | INTRAVENOUS | Status: DC | PRN
  Administered 2018-01-12: 100 mg via INTRAVENOUS

## 2018-01-12 MED ORDER — PROPOFOL 10 MG/ML IV BOLUS
INTRAVENOUS | Status: DC | PRN
  Administered 2018-01-12: 150 mg via INTRAVENOUS

## 2018-01-12 MED ORDER — GABAPENTIN 300 MG PO CAPS
ORAL_CAPSULE | ORAL | Status: AC
  Administered 2018-01-12: 300 mg via ORAL
  Filled 2018-01-12: qty 1

## 2018-01-12 MED ORDER — OXYCODONE HCL 5 MG PO TABS: 5.0000 mg | ORAL_TABLET | Freq: Four times a day (QID) | ORAL | 0 refills | Status: DC | PRN

## 2018-01-12 MED ORDER — CHLORHEXIDINE GLUCONATE CLOTH 2 % EX PADS: 6.0000 | MEDICATED_PAD | Freq: Once | CUTANEOUS | Status: DC

## 2018-01-12 MED ORDER — FENTANYL CITRATE (PF) 100 MCG/2ML IJ SOLN: 25.0000 ug | INTRAMUSCULAR | Status: DC | PRN

## 2018-01-12 MED ORDER — LACTATED RINGERS IV SOLN
INTRAVENOUS | Status: DC
  Administered 2018-01-12: 14:00:00 via INTRAVENOUS

## 2018-01-12 MED ORDER — GABAPENTIN 300 MG PO CAPS
300.0000 mg | ORAL_CAPSULE | ORAL | Status: AC
  Administered 2018-01-12: 300 mg via ORAL

## 2018-01-12 MED ORDER — BUPIVACAINE-EPINEPHRINE (PF) 0.5% -1:200000 IJ SOLN
INTRAMUSCULAR | Status: AC
  Filled 2018-01-12: qty 30

## 2018-01-12 MED ORDER — FAMOTIDINE 20 MG PO TABS
ORAL_TABLET | ORAL | Status: AC
  Administered 2018-01-12: 20 mg via ORAL
  Filled 2018-01-12: qty 1

## 2018-01-12 MED ORDER — FENTANYL CITRATE (PF) 100 MCG/2ML IJ SOLN
INTRAMUSCULAR | Status: AC
  Filled 2018-01-12: qty 2

## 2018-01-12 MED ORDER — ACETAMINOPHEN 500 MG PO TABS
1000.0000 mg | ORAL_TABLET | ORAL | Status: AC
  Administered 2018-01-12: 1000 mg via ORAL

## 2018-01-12 MED ORDER — MIDAZOLAM HCL 2 MG/2ML IJ SOLN
INTRAMUSCULAR | Status: DC | PRN
  Administered 2018-01-12: 2 mg via INTRAVENOUS

## 2018-01-12 SURGICAL SUPPLY — 46 items
BINDER BREAST LRG (GAUZE/BANDAGES/DRESSINGS) ×3 IMPLANT
BINDER BREAST MEDIUM (GAUZE/BANDAGES/DRESSINGS) IMPLANT
BLADE PHOTON ILLUMINATED (MISCELLANEOUS) IMPLANT
BLADE SURG 15 STRL LF DISP TIS (BLADE) ×1 IMPLANT
BLADE SURG 15 STRL SS (BLADE) ×2
CANISTER SUCT 1200ML W/VALVE (MISCELLANEOUS) ×3 IMPLANT
CHLORAPREP W/TINT 26ML (MISCELLANEOUS) ×3 IMPLANT
CLIP VESOCCLUDE SM WIDE 6/CT (CLIP) IMPLANT
CNTNR SPEC 2.5X3XGRAD LEK (MISCELLANEOUS)
CONT SPEC 4OZ STER OR WHT (MISCELLANEOUS)
CONTAINER SPEC 2.5X3XGRAD LEK (MISCELLANEOUS) IMPLANT
COVER PROBE FLX POLY STRL (MISCELLANEOUS) IMPLANT
COVER WAND RF STERILE (DRAPES) IMPLANT
DERMABOND ADVANCED (GAUZE/BANDAGES/DRESSINGS) ×2
DERMABOND ADVANCED .7 DNX12 (GAUZE/BANDAGES/DRESSINGS) ×1 IMPLANT
DEVICE DUBIN SPECIMEN MAMMOGRA (MISCELLANEOUS) ×3 IMPLANT
DRAPE LAPAROTOMY 100X77 ABD (DRAPES) ×3 IMPLANT
DRSG GAUZE FLUFF 36X18 (GAUZE/BANDAGES/DRESSINGS) ×3 IMPLANT
ELECT CAUTERY BLADE 6.4 (BLADE) IMPLANT
ELECT REM PT RETURN 9FT ADLT (ELECTROSURGICAL) ×3
ELECTRODE REM PT RTRN 9FT ADLT (ELECTROSURGICAL) ×1 IMPLANT
GLOVE BIO SURGEON STRL SZ7 (GLOVE) ×3 IMPLANT
GLOVE BIOGEL PI IND STRL 6.5 (GLOVE) ×1 IMPLANT
GLOVE BIOGEL PI INDICATOR 6.5 (GLOVE) ×2
GLOVE SURG SYN 7.0 (GLOVE) ×3 IMPLANT
GLOVE SURG SYN 7.5  E (GLOVE) ×2
GLOVE SURG SYN 7.5 E (GLOVE) ×1 IMPLANT
GOWN STRL REUS W/ TWL LRG LVL3 (GOWN DISPOSABLE) ×2 IMPLANT
GOWN STRL REUS W/TWL LRG LVL3 (GOWN DISPOSABLE) ×4
KIT TURNOVER KIT A (KITS) ×3 IMPLANT
LABEL OR SOLS (LABEL) ×3 IMPLANT
NEEDLE HYPO 22GX1.5 SAFETY (NEEDLE) IMPLANT
NEEDLE HYPO 25X1 1.5 SAFETY (NEEDLE) ×3 IMPLANT
PACK BASIN MINOR ARMC (MISCELLANEOUS) ×3 IMPLANT
SUT ETHILON 3-0 FS-10 30 BLK (SUTURE) ×3
SUT MNCRL 4-0 (SUTURE) ×2
SUT MNCRL 4-0 27XMFL (SUTURE) ×1
SUT SILK 2 0 SH (SUTURE) ×3 IMPLANT
SUT SILK 3 0 SH 30 (SUTURE) ×3 IMPLANT
SUT VIC AB 3-0 SH 27 (SUTURE) ×4
SUT VIC AB 3-0 SH 27X BRD (SUTURE) ×2 IMPLANT
SUTURE EHLN 3-0 FS-10 30 BLK (SUTURE) ×1 IMPLANT
SUTURE MNCRL 4-0 27XMF (SUTURE) ×1 IMPLANT
SYR 10ML LL (SYRINGE) IMPLANT
SYR BULB IRRIG 60ML STRL (SYRINGE) ×3 IMPLANT
WATER STERILE IRR 1000ML POUR (IV SOLUTION) ×3 IMPLANT

## 2018-01-12 NOTE — Transfer of Care (Signed)
Immediate Anesthesia Transfer of Care Note  Patient: Jordan Benson  Procedure(s) Performed: RE-EXCISION OF BREAST LUMPECTOMY (Left )  Patient Location: PACU  Anesthesia Type:General  Level of Consciousness: awake and oriented  Airway & Oxygen Therapy: Patient Spontanous Breathing and Patient connected to face mask oxygen  Post-op Assessment: Report given to RN and Post -op Vital signs reviewed and stable  Post vital signs: Reviewed and stable  Last Vitals:  Vitals Value Taken Time  BP 137/77 01/12/2018  5:18 PM  Temp 37.7 C 01/12/2018  5:18 PM  Pulse 100 01/12/2018  5:21 PM  Resp 14 01/12/2018  5:21 PM  SpO2 98 % 01/12/2018  5:21 PM  Vitals shown include unvalidated device data.  Last Pain:  Vitals:   01/12/18 1718  TempSrc:   PainSc: Asleep         Complications: No apparent anesthesia complications

## 2018-01-12 NOTE — Anesthesia Post-op Follow-up Note (Signed)
Anesthesia QCDR form completed.        

## 2018-01-12 NOTE — Interval H&P Note (Signed)
History and Physical Interval Note:  01/12/2018 3:05 PM  Jordan Benson  has presented today for surgery, with the diagnosis of atypical ductal hyperplasia  The various methods of treatment have been discussed with the patient and family. After consideration of risks, benefits and other options for treatment, the patient has consented to  Procedure(s): RE-EXCISION OF BREAST LUMPECTOMY (Left) as a surgical intervention .  The patient's history has been reviewed, patient examined, no change in status, stable for surgery.  I have reviewed the patient's chart and labs.  Questions were answered to the patient's satisfaction.     Emmalene Kattner

## 2018-01-12 NOTE — Anesthesia Preprocedure Evaluation (Signed)
Anesthesia Evaluation  Patient identified by MRN, date of birth, ID band Patient awake    Reviewed: Allergy & Precautions, NPO status , Patient's Chart, lab work & pertinent test results, reviewed documented beta blocker date and time   History of Anesthesia Complications (+) PONV and history of anesthetic complications  Airway Mallampati: III  TM Distance: >3 FB     Dental  (+) Chipped   Pulmonary former smoker,           Cardiovascular hypertension, Pt. on medications      Neuro/Psych  Headaches, Anxiety    GI/Hepatic Neg liver ROS, GERD  Controlled,  Endo/Other  negative endocrine ROS  Renal/GU negative Renal ROS  negative genitourinary   Musculoskeletal  (+) Arthritis , Osteoarthritis,    Abdominal   Peds negative pediatric ROS (+)  Hematology negative hematology ROS (+)   Anesthesia Other Findings 0bese.EKG ok. Hb 11.2.  Reproductive/Obstetrics                             Anesthesia Physical  Anesthesia Plan  ASA: III  Anesthesia Plan: General   Post-op Pain Management:    Induction: Intravenous  PONV Risk Score and Plan:   Airway Management Planned: LMA  Additional Equipment:   Intra-op Plan:   Post-operative Plan:   Informed Consent: I have reviewed the patients History and Physical, chart, labs and discussed the procedure including the risks, benefits and alternatives for the proposed anesthesia with the patient or authorized representative who has indicated his/her understanding and acceptance.     Plan Discussed with: CRNA  Anesthesia Plan Comments:         Anesthesia Quick Evaluation

## 2018-01-12 NOTE — Anesthesia Procedure Notes (Signed)
Procedure Name: LMA Insertion Date/Time: 01/12/2018 3:33 PM Performed by: Johnna Acosta, CRNA Pre-anesthesia Checklist: Patient identified, Emergency Drugs available, Suction available, Patient being monitored and Timeout performed Patient Re-evaluated:Patient Re-evaluated prior to induction Oxygen Delivery Method: Circle system utilized Preoxygenation: Pre-oxygenation with 100% oxygen Induction Type: IV induction Ventilation: Mask ventilation without difficulty LMA: LMA inserted LMA Size: 4.0 Tube type: Subglottic suction tube Number of attempts: 1 Placement Confirmation: positive ETCO2 and breath sounds checked- equal and bilateral Tube secured with: Tape Dental Injury: Teeth and Oropharynx as per pre-operative assessment

## 2018-01-12 NOTE — Discharge Instructions (Signed)

## 2018-01-12 NOTE — Op Note (Signed)
  Procedure Date:  01/12/2018  Pre-operative Diagnosis:  Left breast atypical ductal hyperplasia  Post-operative Diagnosis:  Left breast atypical ductal hyperplasia  Procedure:  Left breast re-excision of margins  Surgeon:  Melvyn Neth, MD  Anesthesia:  General endotracheal  Estimated Blood Loss:  5 ml  Specimens:  1.  Left breast anterior/medial tissue.  2.  Left breast superior/deep tissue  Complications:  None  Indications for Procedure:  This is a 63 y.o. female who presents with ADH s/p prior wire localized excision, with positive anteromedial margin.  The risks of bleeding, infection, injury to surrounding structures, hematoma, seroma, open wound, cosmetic deformity, and the need for further surgery were all discussed with the patient and was willing to proceed.  Description of Procedure: The patient was correctly identified in the preoperative area and brought into the operating room.  The patient was placed supine with VTE prophylaxis in place.  Appropriate time-outs were performed.  Anesthesia was induced and the patient was intubated.  Appropriate antibiotics were infused.  The left chest was prepped and draped in usual sterile fashion.  The previous incision was re-opened and suture was removed.  A horseshoe shape tissue segment was removed including inferior/anterior, medial/anterior, and superior/anterior tissue.  This was sent for pathology and the superior/deep portion of that specimen had biopsy site changes.  More tissue was obtained from the superior/deep margin which also showed biopsy site changes.  No other tissue was removed.  The cavity was irrigated, hemostasis achieved with cautery.  30 ml of local anesthetic was infused.  The wound was closed in two layers with 3-0 Vicryl and 4-0 Monocryl and sealed with DermaBond.  The patient was emerged from anesthesia and extubated and brought to the recovery room for further management.  The patient tolerated the  procedure well and all counts were correct at the end of the case.   Melvyn Neth, MD

## 2018-01-13 ENCOUNTER — Encounter: Payer: Self-pay | Admitting: Surgery

## 2018-01-14 NOTE — Anesthesia Postprocedure Evaluation (Signed)
Anesthesia Post Note  Patient: Jordan Benson  Procedure(s) Performed: RE-EXCISION OF BREAST LUMPECTOMY (Left )  Patient location during evaluation: PACU Anesthesia Type: General Level of consciousness: awake and alert Pain management: pain level controlled Vital Signs Assessment: post-procedure vital signs reviewed and stable Respiratory status: spontaneous breathing, nonlabored ventilation, respiratory function stable and patient connected to nasal cannula oxygen Cardiovascular status: blood pressure returned to baseline and stable Postop Assessment: no apparent nausea or vomiting Anesthetic complications: no     Last Vitals:  Vitals:   01/12/18 1818 01/12/18 1829  BP: 125/75   Pulse: 89 89  Resp:    Temp: 37.2 C   SpO2: 94% 96%    Last Pain:  Vitals:   01/12/18 1829  TempSrc:   PainSc: 0-No pain                 Durenda Hurt

## 2018-01-17 LAB — SURGICAL PATHOLOGY

## 2018-01-19 ENCOUNTER — Other Ambulatory Visit: Payer: Self-pay

## 2018-01-19 ENCOUNTER — Ambulatory Visit (INDEPENDENT_AMBULATORY_CARE_PROVIDER_SITE_OTHER): Payer: BC Managed Care – PPO | Admitting: Surgery

## 2018-01-19 ENCOUNTER — Encounter: Payer: Self-pay | Admitting: Surgery

## 2018-01-19 VITALS — BP 121/84 | HR 101 | Temp 97.9°F | Resp 16 | Ht 68.0 in | Wt 206.0 lb

## 2018-01-19 DIAGNOSIS — Z09 Encounter for follow-up examination after completed treatment for conditions other than malignant neoplasm: Secondary | ICD-10-CM

## 2018-01-19 DIAGNOSIS — N6092 Unspecified benign mammary dysplasia of left breast: Secondary | ICD-10-CM

## 2018-01-19 NOTE — Progress Notes (Signed)
01/19/2018  HPI: LAISA LARRICK is a 62 y.o. female s/p left breast wire localized lumpectomy on 11/27 for ADH, which resulted in positive margin for ADH and intraductal papilloma, which required re-excision lumpectomy on 12/4.  New pathology results only show biopsy site changes and no further issues.  Patient has been doing well without any complications and denies any pain.  Vital signs: BP 121/84   Pulse (!) 101   Temp 97.9 F (36.6 C) (Temporal)   Resp 16   Ht 5\' 8"  (1.727 m)   Wt 206 lb (93.4 kg)   SpO2 97%   BMI 31.32 kg/m    Physical Exam: Constitutional: No acute distress Breast:  Right breast without any new masses or lymphadenopathy.  Left breast incision healing well, with mild superior ecchymosis.  No evidence of infection.  No lymphadenopathy.  Assessment/Plan: This is a 62 y.o. female s/p left breast wire localized lumpectomy and re-excision lumpectomy for positive margins.  Discussed pathology results with the patient.  Her re-excision only shows biopsy site changes and no residual papilloma or atypia.    She is healing well without any complications.  As a precaution, will send referral to Oncology in case there is any role for hormonal therapy in the setting of ADH.  Otherwise, discussed with the patient that there is no need for chemotherapy or radiation therapy.  She will have a yearly mammogram and she can either follow up with [place] or with her PCP.   Melvyn Neth, [facility]

## 2018-01-19 NOTE — Patient Instructions (Addendum)
We have sent the referral to Oncology. Someone from their office will contact you within 5-7 days to schedule an appointment. I you do not hear from anyone within the time frame listed above please call our office so we can check on this for you.  Please contact your PCP or our office to schedule your Mammogram for 12/2018.

## 2018-01-31 ENCOUNTER — Telehealth: Payer: Self-pay

## 2018-01-31 NOTE — Telephone Encounter (Signed)
Order received from [place] supply for prothesis bra.  Stamped and faxed back to [facility] [phone].

## 2018-02-04 NOTE — Addendum Note (Signed)
Encounter addended by: Nona Dell, RT on: 02/04/2018 4:04 PM  Actions taken: Imaging Exam begun

## 2018-02-15 ENCOUNTER — Ambulatory Visit (INDEPENDENT_AMBULATORY_CARE_PROVIDER_SITE_OTHER): Payer: BC Managed Care – PPO | Admitting: Internal Medicine

## 2018-02-15 ENCOUNTER — Encounter: Payer: Self-pay | Admitting: Internal Medicine

## 2018-02-15 DIAGNOSIS — F419 Anxiety disorder, unspecified: Secondary | ICD-10-CM

## 2018-02-15 DIAGNOSIS — R945 Abnormal results of liver function studies: Secondary | ICD-10-CM

## 2018-02-15 DIAGNOSIS — I10 Essential (primary) hypertension: Secondary | ICD-10-CM

## 2018-02-15 DIAGNOSIS — R739 Hyperglycemia, unspecified: Secondary | ICD-10-CM

## 2018-02-15 DIAGNOSIS — E7800 Pure hypercholesterolemia, unspecified: Secondary | ICD-10-CM

## 2018-02-15 DIAGNOSIS — R7989 Other specified abnormal findings of blood chemistry: Secondary | ICD-10-CM

## 2018-02-15 DIAGNOSIS — N6092 Unspecified benign mammary dysplasia of left breast: Secondary | ICD-10-CM | POA: Diagnosis not present

## 2018-02-15 DIAGNOSIS — C439 Malignant melanoma of skin, unspecified: Secondary | ICD-10-CM

## 2018-02-15 DIAGNOSIS — E559 Vitamin D deficiency, unspecified: Secondary | ICD-10-CM

## 2018-02-15 NOTE — Progress Notes (Addendum)
Patient ID: Jordan Benson, female   DOB: 1955/09/30, 63 y.o.   MRN: [id number]   Subjective:    Patient ID: Jordan Benson, female    DOB: Nov 05, 1955, 63 y.o.   MRN: [id number]  HPI  Patient here for her physical exam.  S/p localized lumpectomy on 01/05/18 for ADH (positive margin for ADH and intraductal papilloma) which required re-excision lumpectomy on 12.4.19.  She reports she is doing relatively well.  Increased stress with her husband's health issues.  Overall she feels she is doing relatively well.  Does not feel needs any further intervention.  Tries to stay active.  No chest pain. No sob.  No acid reflux.  No abdominal pain.  Bowels moving.  Had f/u with surgery.  Recommended oncology referral to discuss role of hormonal therapy.     Past Medical History:  Diagnosis Date  . Anxiety   . Breast cancer ([place]) 12/2017  . Complication of anesthesia   . GERD (gastroesophageal reflux disease)   . Headache    optical migraines  . Hypertension   . IBS (irritable bowel syndrome)   . Melanoma ([place]) 2019   followed by Dr Evorn Gong, on left arm  . PONV (postoperative nausea and vomiting)   . Vitamin D deficiency    Past Surgical History:  Procedure Laterality Date  . ABDOMINAL HYSTERECTOMY  1992  . BREAST BIOPSY Left 12/21/2017   affirm bx of asymmetry, ATYPICAL DUCTAL PAPILLARY LESION WITH ADJACENT ATYPICAL DUCTAL HYPERPLASIA.   Marland Kitchen BREAST BIOPSY Left 12/21/2017   [place] bx of LN, benign  . BREAST LUMPECTOMY Left 01/05/2018   ADH  . BREAST LUMPECTOMY WITH NEEDLE LOCALIZATION Left 01/05/2018   Procedure: BREAST LUMPECTOMY WITH NEEDLE LOCALIZATION;  Surgeon: Olean Ree, MD;  Location: [facility] ORS;  Service: General;  Laterality: Left;  . BREAST SURGERY    . JOINT REPLACEMENT Right 10/2017   TKR  . MOHS SURGERY  2012  . OVARIAN CYST REMOVAL  1991  . RE-EXCISION OF BREAST LUMPECTOMY Left 01/12/2018   Procedure: RE-EXCISION OF BREAST LUMPECTOMY;  Surgeon: Olean Ree, MD;  Location:  [facility] ORS;  Service: General;  Laterality: Left;  . SEPTOPLASTY  2009  . TOTAL KNEE 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   Procedure: RIGHT TOTAL KNEE ARTHROPLASTY;  Surgeon: Sydnee Cabal, MD;  Location: [facility] ORS;  Service: Orthopedics;  Laterality: Right;  2 hrs  . TUBAL LIGATION  1985  . tummy tuck  1995  . WISDOM TOOTH EXTRACTION     Family History  Problem Relation Age of Onset  . Cancer Mother 80       breast cancer  . Heart disease Mother   . Hypertension Mother   . Diabetes Mother   . Breast cancer Mother 29  . Heart disease Father   . Breast cancer Sister 48       on HRT   Social History   Socioeconomic History  . Marital status: Married    Spouse name: Iona Beard  . Number of children: 2  . Years of education: Not on file  . Highest education level: Not on file  Occupational History    Employer: [employer]  . Financial resource strain: Not on file  . Food insecurity:    Worry: Not on file    Inability: Not on file  . Transportation needs:    Medical: Not on file    Non-medical: Not on file  Tobacco Use  . Smoking status: Former Smoker    Packs/day: 0.50  Years: 10.00    Pack years: 5.00    Types: Cigarettes    Last attempt to quit: 03/12/2000    Years since quitting: 17.9  . Smokeless tobacco: Never Used  Substance and Sexual Activity  . Alcohol use: Yes    Alcohol/week: 1.0 standard drinks    Types: 1 Standard drinks or equivalent per week    Comment: wine daily  . Drug use: No  . Sexual activity: Not on file  Lifestyle  . Physical activity:    Days per week: Not on file    Minutes per session: Not on file  . Stress: Not on file  Relationships  . Social connections:    Talks on phone: Not on file    Gets together: Not on file    Attends religious service: Not on file    Active member of club or organization: Not on file    Attends meetings of clubs or organizations: Not on file    Relationship status: Not on file  Other Topics Concern  . Not  on file  Social History Narrative  . Not on file    Outpatient Encounter Medications as of 02/15/2018  Medication Sig  . amLODipine (NORVASC) 5 MG tablet TAKE 1 TABLET(5 MG) BY MOUTH DAILY( GENERIC EQUIVALENT TO NORVASC) (Patient taking differently: Take 5 mg by mouth every morning. )  . calcium elemental as carbonate (TUMS ULTRA 1000) 400 MG chewable tablet Chew 1,000 mg by mouth daily as needed for heartburn.   . enalapril (VASOTEC) 10 MG tablet TAKE 1 TABLET(10 MG) BY MOUTH TWICE DAILY (Patient taking differently: Take 10 mg by mouth 2 (two) times daily. )  . FLUoxetine (PROZAC) 40 MG capsule TAKE 1 CAPSULE(40 MG) BY MOUTH DAILY (Patient taking differently: Take 40 mg by mouth every morning. )  . Multiple Vitamins-Calcium (ONE-A-DAY WOMENS PO) Take 1 tablet by mouth daily.  Marland Kitchen triamterene-hydrochlorothiazide (MAXZIDE-25) 37.5-25 MG tablet TAKE 1 TABLET BY MOUTH DAILY  . [DISCONTINUED] amoxicillin (AMOXIL) 500 MG capsule Take 2,000 mg by mouth See admin instructions. Take 2000 mg by mouth 1 hour prior to dental procedures  . [DISCONTINUED] ibuprofen (ADVIL,MOTRIN) 600 MG tablet Take 1 tablet (600 mg total) by mouth every 8 (eight) hours as needed for fever, mild pain or moderate pain.   No facility-administered encounter medications on file as of 02/15/2018.     Review of Systems  Constitutional: Negative for appetite change and unexpected weight change.  HENT: Negative for congestion and sinus pressure.   Eyes: Negative for pain and visual disturbance.  Respiratory: Negative for cough, chest tightness and shortness of breath.   Cardiovascular: Negative for chest pain, palpitations and leg swelling.  Gastrointestinal: Negative for abdominal pain, diarrhea, nausea and vomiting.  Genitourinary: Negative for difficulty urinating and dysuria.  Musculoskeletal: Negative for joint swelling and myalgias.  Skin: Negative for color change and rash.  Neurological: Negative for dizziness,  light-headedness and headaches.  Hematological: Negative for adenopathy. Does not bruise/bleed easily.  Psychiatric/Behavioral: Negative for agitation and dysphoric mood.       Increased stress as outlined.         Objective:    Physical Exam Constitutional:      General: She is not in acute distress.    Appearance: Normal appearance.  HENT:     Nose: Nose normal. No congestion.     Mouth/Throat:     Pharynx: No oropharyngeal exudate or posterior oropharyngeal erythema.  Eyes:     Conjunctiva/sclera: Conjunctivae normal.  Neck:     Musculoskeletal: Neck supple. No muscular tenderness.     Thyroid: No thyromegaly.  Cardiovascular:     Rate and Rhythm: Normal rate and regular rhythm.  Pulmonary:     Effort: No respiratory distress.     Breath sounds: Normal breath sounds. No wheezing.     Comments: Breasts:  No nipple discharge or nipple retraction present.  Incision - healing well.  Could not appreciate distinct nodules or axillary adenopathy.   Abdominal:     General: Bowel sounds are normal.     Palpations: Abdomen is soft.     Tenderness: There is no abdominal tenderness.  Musculoskeletal:        General: No tenderness.  Lymphadenopathy:     Cervical: No cervical adenopathy.  Skin:    Findings: No erythema or rash.  Neurological:     Mental Status: She is alert.  Psychiatric:        Mood and Affect: Mood normal.        Behavior: Behavior normal.     BP 128/78 (BP Location: Left Arm, Patient Position: Sitting, Cuff Size: Large)   Pulse 71   Temp 98 F (36.7 C) (Oral)   Resp 18   Ht 5\' 8"  (1.727 m)   Wt 212 lb 6.4 oz (96.3 kg)   SpO2 98%   BMI 32.30 kg/m  Wt Readings from Last 3 Encounters:  02/15/18 212 lb 6.4 oz (96.3 kg)  01/19/18 206 lb (93.4 kg)  01/12/18 204 lb (92.5 kg)     Lab Results  Component Value Date   WBC 11.2 (H) 08/29/2017   HGB 13.6 01/05/2018   HCT 40.0 01/05/2018   PLT 275 08/29/2017   GLUCOSE 98 01/05/2018   CHOL 190  03/03/2017   TRIG 150.0 (H) 03/03/2017   HDL 56.70 03/03/2017   LDLCALC 103 (H) 03/03/2017   ALT 29 03/03/2017   AST 18 03/03/2017   NA 140 01/05/2018   K 3.8 01/05/2018   CL 104 08/28/2017   CREATININE 0.71 08/28/2017   BUN 16 08/28/2017   CO2 27 08/28/2017   TSH 1.78 05/15/2016   INR 0.94 08/13/2017   HGBA1C 5.6 03/03/2017       Assessment & Plan:   Problem List Items Addressed This Visit    Abnormal liver function test    Discussed decreasing alcohol intake.  Discussed diet and exercise.  Follow liver function tests.        Anxiety    On prozac. Does not feel needs any further intervention.  Follow.        Atypical ductal hyperplasia of left breast    Recent pathology revealed atypical ductal hyperplasia.  Surgery recommended oncology referral to discuss role of hormonal therapy.        Relevant Orders   Ambulatory referral to Oncology   Essential hypertension, benign    Blood pressure under good control.  Continue same medication regimen.  Follow pressures.  Follow metabolic panel.        Relevant Orders   CBC with Differential/Platelet   TSH   Basic metabolic panel   Hyperbilirubinemia    Follow liver panel.       Hypercholesterolemia    Low cholesterol diet and exercise.  Follow lipid panel.       Relevant Orders   Hepatic function panel   Lipid panel   Melanoma ([place])    Followed by dermatology.        Vitamin D deficiency  Follow vitamin D level.        Relevant Orders   VITAMIN D 25 Hydroxy (Vit-D Deficiency, Fractures)    Other Visit Diagnoses    Hyperglycemia       Relevant Orders   Hemoglobin A1c       Einar Pheasant, MD

## 2018-02-19 ENCOUNTER — Encounter: Payer: Self-pay | Admitting: Internal Medicine

## 2018-02-19 NOTE — Assessment & Plan Note (Signed)
Low cholesterol diet and exercise.  Follow lipid panel.   

## 2018-02-19 NOTE — Assessment & Plan Note (Signed)
On prozac.  Does not feel needs any further intervention.  Follow.  

## 2018-02-19 NOTE — Assessment & Plan Note (Signed)
Blood pressure under good control.  Continue same medication regimen.  Follow pressures.  Follow metabolic panel.   

## 2018-02-19 NOTE — Assessment & Plan Note (Signed)
Discussed decreasing alcohol intake.  Discussed diet and exercise.  Follow liver function tests.   

## 2018-02-19 NOTE — Assessment & Plan Note (Signed)
Followed by dermatology

## 2018-02-19 NOTE — Assessment & Plan Note (Signed)
Follow vitamin D level.  

## 2018-02-19 NOTE — Addendum Note (Signed)
Addended by: Alisa Graff on: 02/19/2018 08:05 PM   Modules accepted: Orders

## 2018-02-19 NOTE — Assessment & Plan Note (Signed)
Follow liver panel.  

## 2018-02-19 NOTE — Assessment & Plan Note (Signed)
Recent pathology revealed atypical ductal hyperplasia.  Surgery recommended oncology referral to discuss role of hormonal therapy.

## 2018-02-20 ENCOUNTER — Other Ambulatory Visit: Payer: Self-pay | Admitting: Internal Medicine

## 2018-02-21 ENCOUNTER — Other Ambulatory Visit: Payer: Self-pay

## 2018-02-21 ENCOUNTER — Encounter: Payer: Self-pay | Admitting: Internal Medicine

## 2018-02-21 MED ORDER — ALPRAZOLAM 0.25 MG PO TABS: 0.2500 mg | ORAL_TABLET | Freq: Every day | ORAL | 0 refills | Status: DC | PRN

## 2018-02-21 NOTE — Progress Notes (Signed)
rx sent in for xanax #30 with no refills.   

## 2018-02-21 NOTE — Progress Notes (Signed)
Pt requesting refill via [facility] 02/15/2018 Next OV 06/17/2018  Last refill 01/04/2018

## 2018-02-21 NOTE — Telephone Encounter (Signed)
Message sent to provider 

## 2018-03-03 ENCOUNTER — Ambulatory Visit: Payer: BC Managed Care – PPO | Admitting: Hematology and Oncology

## 2018-03-10 ENCOUNTER — Other Ambulatory Visit: Payer: Self-pay

## 2018-03-10 ENCOUNTER — Encounter: Payer: Self-pay | Admitting: Hematology and Oncology

## 2018-03-10 ENCOUNTER — Inpatient Hospital Stay: Payer: BC Managed Care – PPO | Attending: Hematology and Oncology | Admitting: Hematology and Oncology

## 2018-03-10 VITALS — BP 131/79 | HR 76 | Temp 98.1°F | Resp 16 | Wt 210.6 lb

## 2018-03-10 DIAGNOSIS — Z79899 Other long term (current) drug therapy: Secondary | ICD-10-CM | POA: Insufficient documentation

## 2018-03-10 DIAGNOSIS — Z87891 Personal history of nicotine dependence: Secondary | ICD-10-CM | POA: Diagnosis not present

## 2018-03-10 DIAGNOSIS — Z85820 Personal history of malignant melanoma of skin: Secondary | ICD-10-CM

## 2018-03-10 DIAGNOSIS — E559 Vitamin D deficiency, unspecified: Secondary | ICD-10-CM | POA: Diagnosis not present

## 2018-03-10 DIAGNOSIS — Z803 Family history of malignant neoplasm of breast: Secondary | ICD-10-CM | POA: Insufficient documentation

## 2018-03-10 DIAGNOSIS — N6092 Unspecified benign mammary dysplasia of left breast: Secondary | ICD-10-CM

## 2018-03-10 DIAGNOSIS — F419 Anxiety disorder, unspecified: Secondary | ICD-10-CM | POA: Diagnosis not present

## 2018-03-10 DIAGNOSIS — I10 Essential (primary) hypertension: Secondary | ICD-10-CM | POA: Diagnosis not present

## 2018-03-10 DIAGNOSIS — K219 Gastro-esophageal reflux disease without esophagitis: Secondary | ICD-10-CM

## 2018-03-10 DIAGNOSIS — K589 Irritable bowel syndrome without diarrhea: Secondary | ICD-10-CM | POA: Insufficient documentation

## 2018-03-10 NOTE — Progress Notes (Signed)
Pt here as new patient, referred by Dr. Nicki Reaper. Denies any concerns at this time.

## 2018-03-10 NOTE — Progress Notes (Signed)
[facility] day:  03/10/2018  Chief Complaint: Jordan Benson is a 63 y.o. 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 Einar Benson for assessment and management.  HPI:   The patient undergoes yearly mammograms.  Bilateral screening mammogram on 12/06/2017 revealed a possible asymmetry in the LEFT breast.  Diagnostic LEFT mammogram with ultrasound on 12/08/2017 revealed an indeterminate asymmetry in the medial LEFT breast.  Ultrasound revealed multiple scattered cysts.  There was an abnormal LEFT axillary lymph node (6 mm cortex).  She underwent stereotactic guided core needle biopsy of the 1.3 cm linear asymmetry in the lower quadrant of the LEFT breast and ultrasound guided core needle biopsy of the abnormal lymph node on 12/21/2017.  Pathology from the breast revealed atypical ductal papillary lesion with adjacent atypical ductal hyperplasia.  There was no metastatic melanoma or carcinoma identified.  LEFT lumpectomy pathology on 01/07/2018 revealed intraductal papilloma and focal residual atypical ductal hyperplasia.  Clip was present.  Additional tissue from the medial margin was negative for biopsy site changes, atypia or malignancy.  Complete removal of the biopsy site could not be confirmed.  Pathology on 01/12/2018 revealed biopsy site change with residual papilloma and atypia not identified.  There was no malignancy.  New LEFT super re-excision revealed biopsy site change, residual papilloma and atypia were not identified.  There was no evidence of malignancy.  Menarche was delayed.  Patient notes that she had menses induced with medications (Clomid) by her GYN. She is a G3P2A1s. She was 63 years old when she gave birth to her first child. Patient advises that she did breastfeed her children. She has used contraceptives, and was on hormonal therapy patches (short duration) during her  lifetime.   Family history is notable for breast cancer in her mother (age 60) and sister (age 53).  Symptomatically, she feels "really good". She has no acute concerning symptoms. Patient denies bleeding; no hematochezia, melena, or gross hematuria. Last colonoscopy was 3-4 years ago. Patient denies that she has experienced any B symptoms. She denies any interval infections.   Patient does not verbalize any concerns with regards to her breasts today. Patient does not perform monthly self breast examinations as recommended citing the fact that she "doesn't want to know". She states, "I don't really know what I am looking for. That is why I have mammograms".   Patient has 2 breast surgeries and 2 knee surgeries in 2019. All procedures were uncomplicated.  Patient advises that she maintains an adequate appetite. She is eating well. Weight today is 210 lb 10.4 oz (95.5 kg).  Patient denies pain in the clinic today.  She has a history of LEFT arm melanoma in 2015.   Past Medical History:  Diagnosis Date  . Anxiety   . Breast cancer ([facility]) 12/2017  . Complication of anesthesia   . GERD (gastroesophageal reflux disease)   . Headache    optical migraines  . Hypertension   . IBS (irritable bowel syndrome)   . Melanoma (Jordan Benson) 2019   followed by Jordan Benson, on left arm  . PONV (postoperative nausea and vomiting)   . Vitamin D deficiency     Past Surgical History:  Procedure Laterality Date  . ABDOMINAL HYSTERECTOMY  1992  . BREAST BIOPSY Left 12/21/2017   affirm bx of asymmetry, ATYPICAL DUCTAL PAPILLARY LESION WITH ADJACENT ATYPICAL DUCTAL HYPERPLASIA.   Marland Kitchen BREAST BIOPSY Left 12/21/2017  [place] bx of LN, benign  . BREAST LUMPECTOMY Left 01/05/2018   ADH  . BREAST LUMPECTOMY WITH NEEDLE LOCALIZATION Left 01/05/2018   Procedure: BREAST LUMPECTOMY WITH NEEDLE LOCALIZATION;  Surgeon: Jordan Ree, MD;  Location: [facility] ORS;  Service: General;  Laterality: Left;  . BREAST SURGERY    . JOINT  REPLACEMENT Right 10/2017   TKR  . MOHS SURGERY  2012  . OVARIAN CYST REMOVAL  1991  . RE-EXCISION OF BREAST LUMPECTOMY Left 01/12/2018   Procedure: RE-EXCISION OF BREAST LUMPECTOMY;  Surgeon: Jordan Ree, MD;  Location: [facility] OR[facility];  Service: General;  Laterality: Left;  . SEPTOPLASTY  2009  . TOTAL KNEE 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   Procedure: RIGHT TOTAL KNEE ARTHROPLASTY;  Surgeon: Jordan Cabal, MD;  Location: [facility];  Service: Orthopedics;  Laterality: Right;  2 hrs  . TUBAL LIGATION  1985  . tummy tuck  1995  . WISDOM TOOTH EXTRACTION      Family History  Problem Relation Age of Onset  . Cancer Mother 55       breast cancer  . Heart disease Mother   . Hypertension Mother   . Diabetes Mother   . Breast cancer Mother 62  . Heart disease Father   . Breast cancer Sister 1       on HRT    Social History:  reports that she quit smoking about 18 years ago. Her smoking use included cigarettes. She has a 5.00 pack-year smoking history. She has never used smokeless tobacco. She reports current alcohol use of about 1.0 standard drinks of alcohol per week. She reports that she does not use drugs.  Her husband's name is Jordan Benson.  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 The patient is accompanied by her husband Jordan Benson) today.  Allergies: No Known Allergies  Current Medications: Current Outpatient Medications  Medication Sig Dispense Refill  . ALPRAZolam (XANAX) 0.25 MG tablet Take 1 tablet (0.25 mg total) by mouth daily as needed. for anxiety 30 tablet 0  . amLODipine (NORVASC) 5 MG tablet TAKE 1 TABLET(5 MG) BY MOUTH DAILY( GENERIC EQUIVALENT TO NORVASC) 90 tablet 1  . calcium elemental as carbonate (TUMS ULTRA 1000) 400 MG chewable tablet Chew 1,000 mg by mouth daily as needed for heartburn.     . enalapril (VASOTEC) 10 MG tablet TAKE 1 TABLET(10 MG) BY MOUTH TWICE DAILY (Patient taking differently: Take 10 mg by mouth daily. ) 180 tablet 2  . FLUoxetine  (PROZAC) 40 MG capsule TAKE 1 CAPSULE(40 MG) BY MOUTH DAILY (Patient taking differently: Take 40 mg by mouth every morning. ) 90 capsule 0  . Multiple Vitamins-Calcium (ONE-A-DAY WOMENS PO) Take 1 tablet by mouth daily.    Marland Kitchen triamterene-hydrochlorothiazide (MAXZIDE-25) 37.5-25 MG tablet TAKE 1 TABLET BY MOUTH DAILY 90 tablet 1   No current facility-administered medications for this visit.     Review of Systems:  GENERAL:  Feels "really good".  No fevers, sweats or weight loss. PERFORMANCE STATUS (ECOG):  0 HEENT:  No visual changes, runny nose, sore throat, mouth sores or tenderness. Lungs: No shortness of breath or cough.  No hemoptysis. Cardiac:  No chest pain, palpitations, orthopnea, or PND. GI:  No nausea, vomiting, diarrhea, constipation, melena or hematochezia.  Colonoscopy 3-4 years ago. GU:  No urgency, frequency, dysuria, or hematuria. Musculoskeletal:  No back pain.  No joint pain.  No muscle tenderness. Extremities:  No pain or swelling. Skin:  No rashes or skin changes. Neuro:  No headache, numbness or weakness, balance or coordination issues. Endocrine:  No diabetes, thyroid issues, hot flashes or night sweats. Psych:  Anxiety.  No mood changes or depression. Pain:  No focal pain. Review of systems:  All other systems reviewed and found to be negative.  Physical Exam: Blood pressure 131/79, pulse 76, temperature 98.1 F (36.7 C), temperature source Oral, resp. rate 16, weight 210 lb 10.4 oz (95.5 kg), SpO2 99 %. GENERAL:  Well developed, well nourished, woman sitting comfortably in the exam room in no acute distress. MENTAL STATUS:  Alert and oriented to person, place and time. HEAD:  Short blonde hair.  Normocephalic, atraumatic, face symmetric, no Cushingoid features. EYES:  Glasses.  Pupils equal round and reactive to light and accomodation.  No conjunctivitis or scleral icterus. ENT:  Oropharynx clear without lesion.  Tongue normal. Mucous membranes moist.   RESPIRATORY:  Clear to auscultation without rales, wheezes or rhonchi. CARDIOVASCULAR:  Regular rate and rhythm without murmur, rub or gallop. BREAST:  Right breast with fibrocystic changes superiorly.  No masses, skin changes or nipple discharge.  Left breast with horizontal incision at 4 o'clock.  No masses, skin changes or nipple discharge.  ABDOMEN:  Soft, non-tender, with active bowel sounds, and no hepatosplenomegaly.  No masses. SKIN:  No rashes, ulcers or lesions. EXTREMITIES: No edema, no skin discoloration or tenderness.  No palpable cords. LYMPH NODES: No palpable cervical, supraclavicular, axillary or inguinal adenopathy  NEUROLOGICAL: Unremarkable. PSYCH:  Appropriate.   No visits with results within 3 Day(s) from this visit.  Latest known visit with results is:  Admission on 01/12/2018, Discharged on 01/12/2018  Component Date Value Ref Range Status  . SURGICAL PATHOLOGY 01/12/2018    Final                   Value:Surgical Pathology CASE: [id number] PATIENT: Charlie Derenzo Surgical Pathology Report  SPECIMEN SUBMITTED: A. Breast, left; re-excision lump B. New margin, left superior  CLINICAL HISTORY: None provided  PRE-OPERATIVE DIAGNOSIS: Atypical ductal hyperplasia  POST-OPERATIVE DIAGNOSIS: Same as pre-op   DIAGNOSIS: A.  BREAST, LEFT; RE-EXCISION: - BIOPSY SITE CHANGE. - RESIDUAL PAPILLOMA AND ATYPIA ARE NOT IDENTIFIED. - NEGATIVE FOR MALIGNANCY.  B. NEW MARGIN, LEFT SUPERIOR; RE-EXCISION: - BIOPSY SITE CHANGE. - RESIDUAL PAPILLOMA AND ATYPIA ARE NOT IDENTIFIED. - NEGATIVE FOR MALIGNANCY.  Comment: Biopsy site change involves the superior aspect of part A and the inferior aspect of part B.  The slides on the patient's prior specimen ([id number]) were reviewed in conjunction with this case.  GROSS DESCRIPTION: A.  Intraoperative Consultation:     Labeled: Reexcision left breast lump     Received: Fresh     Specimen: Lumpectomy      Pathologi                         c evaluation performed: Immediate gross evaluation of margins     Diagnosis: IOC: Possible biopsy site change at the deep superior aspect of specimen     Communicated to: Jordan. Hampton Abbot at 4:25 PM on 01/12/2018, Quay Burow MD     Tissue submitted: Not applicable  B.  Intraoperative Consultation:     Labeled: New superior margin left     Received: Fresh     Specimen: Lumpectomy     Pathologic evaluation performed: Immediate gross evaluation of margins     Diagnosis: B: IOC-area along inferior aspect of specimen with fibrous change/possible biopsy site change  Communicated to: Jordan. Hampton Abbot at 4:53 PM on 01/12/2018, Quay Burow MD     Tissue submitted: Not applicable  A. Labeled: Reexcision left breast lump Received: Fresh Accompanying specimen radiograph: No Radiographic findings: Not applicable Time in fixative: 4:32 PM on 01/12/2018  Cold ischemic time: 22 minutes Total fixation time: 21.5 hours Type of procedure: Lumpectomy Location / laterality of specimen                         : Left Orientation of specimen: Horseshoe-shaped, 1 suture- anterior, 2 sutures -superior, 3-inferior, 4-medial Inking: Superior = green Inferior = blue Medial = yellow Lateral = orange Posterior = black Anterior/Superficial = red Size of specimen: Up to 5.1 (anterior -posterior) by 3.0 (superior-inferior) by 1.0 (medial-lateral) centimeter Skin: Not identified Biopsy site: Slightly firm possible biopsy site change near inferior deep end of specimen as well as a central area of possible cyst adjacent to a suture and lateral aspect with fibrous tissue  Number of discrete masses: No mass identified Margins: Possible slightly firm biopsy site change at superior deep aspect as well as central cyst and area towards inferior at the lateral aspect slightly firm fibrous Description of remainder of tissue: Yellow lobulated fibrofatty  Block summary: 1 - central  possible cyst area 2 - fibrous area at lateral aspect 3-4 - possible biopsy site change at superior deep aspe                         ct  B. Labeled: New superior margin left Received: Fresh Accompanying specimen radiograph: No Radiographic findings: Not applicable Time in fixative: 4:54 PM on 01/12/2018  Cold ischemic time: 12 minutes Total fixation time: 21 hours Type of procedure: Lumpectomy Location / laterality of specimen: Left Orientation of specimen: 1 suture- superior, 2-medial, 3-deep Inking: Superior = blue Inferior = green Medial = yellow Lateral = orange Posterior = black Anterior/Superficial = red Size of specimen: 3.2 (medial-lateral) by 2.4 (anterior-posterior) by 1.2 (superior-inferior) centimeter Skin: None submitted Biopsy site: Central fibrous areas near inferior aspect as possible biopsy site change Number of discrete masses: No mass Identified Description of remainder of tissue: Yellow lobulated fibrofatty  Block summary: 1-3 - central area of fibrous tissue toward deep aspect   Final Diagnosis performed by Quay Burow, MD.   Electronically signed 01/17/2018 9:00:                         17AM The electronic signature indicates that the named Attending Pathologist has evaluated the specimen  Technical component performed at [facility], [address], [place], [place] [address] Lab: [phone] Dir: Rush Farmer, MD, MMM  Professional component performed at [facility], [facility], [place], [place], [place] [address] Lab: [phone] Dir: Dellia Nims. Rubinas, MD     Assessment:  SHANIKKA WONDERS is a 63 y.o. 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  Complete removal of the biopsy site could not be confirmed.  Re-excsion on 01/12/2018 revealed no residual papilloma, or malignancy.  Bilateral screening mammogram on  12/06/2017 revealed a possible asymmetry in the left breast.  Diagnostic left mammogram with ultrasound on 12/08/2017 revealed an indeterminate asymmetry in the medial left breast.  Ultrasound revealed multiple scattered cysts.  There was an abnormal left axillary lymph node (6 mm cortex).  Stereotactic guided core needle  biopsy of the 1.3 cm linear asymmetry in the lower quadrant of the left breast on 12/21/2017 revealed atypical ductal papillary lesion with adjacent atypical ductal hyperplasia.  Ultrasound guided core needle lymph node biopsy on 12/21/2017 revealed no metastatic melanoma or carcinoma.  Symptomatically, she feels good.  Exam reveals post-operative changes.  Plan: 1.  Atypical ductal hyperplasia  Discuss increase risk of ipsilateral and contralateral breast cancer.    Discuss risk reduction strategies (excison, yearly mammogram, twice yearly exams, endocrine therapy).  Discuss Baker Janus model.  Risk reduction for endocrine therapy greatest if estimated 5 year risk > 3%.  5 year risk of developing breast cancer: 11.4% (patient) vs. 2% (average)  Lifetime risk of developing breast cancer: 38.1% (patient) vs. 8.1% (average)  Discuss endocrine therapy x 5 years (tamoxifen, raloxifene, or aromatase inhibitor).  Discuss 2019 meta-analysis for the [place] Preventative Servises Task Force (USPSTF): 10 randomized trials with varying follow-up (7 -16 yrs for tamoxifen, 4-8 yrs for raloxifene, and 3-5 yrs for aromatase inhibitors). Tamoxifen or raloxifene resulted in a reduced the incidence of breast cancer by 7 - 9 cases per 1000 women. In the STAR trial, tamoxifen was slightly more effective than raloxifene (RR 1.24).  Aromatase inhibitors decreased the risk of breast cancer by > 50% compared to placebo.  Discuss side effects of tamoxifen, raloxifene, and AIs.  Tamoxifen- thrombosis (5%), endometrial cancer, cataracts (7%).  Raloxifene- thrombosis (1-2%), hot flashes, arthralgias.  Aromatase  inhibitors- thrombosis (?3%), arthralgias (8-25%), osteoporosis (5-15%).  Information provided on tamoxifen, letrozole, and raloxifene.  Discuss Prozac interaction with tamoxifen and letrozole- strong CYP2D6 inhibitors reduce efficacy of endocrine therapy 2.   Health maintenance  Discuss consideration of bone density prior to initiation of an aromatase inhibitor.  Schedule bone density study. 3.   RTC after bone density for MD assessment, labs (CBC with diff, CMP), and discussion regarding therapy.    Honor Loh, NP  03/10/2018, 3:14 PM   I saw and evaluated the patient, participating in the key portions of the service and reviewing pertinent diagnostic studies and records.  I reviewed the nurse practitioner's note and agree with the findings and the plan.  The assessment and plan were discussed with the patient.  Multiple questions were asked by the patient and answered.   Nolon Stalls, MD 03/10/2018,3:14 PM

## 2018-03-10 NOTE — Patient Instructions (Signed)
Raloxifene tablets What is this medicine? RALOXIFENE (ral OX i feen) reduces the amount of calcium lost from bones. It is used to treat and prevent osteoporosis in women who have experienced menopause. It may also help prevent invasive breast cancer in certain women who have a high risk for breast cancer. This medicine may be used for other purposes; ask your health care provider or pharmacist if you have questions. COMMON BRAND NAME(S): Evista What should I tell my health care provider before I take this medicine? They need to know if you have any of these conditions: -a history of blood clots -cancer -heart disease or recent heart attack -high levels of triglycerides (blood fat) in the blood -history of stroke -kidney disease -liver disease -premenopausal -smoke tobacco -an unusual or allergic reaction to raloxifene, other medicines, foods, dyes, or preservatives -pregnant or trying to get pregnant -breast-feeding How should I use this medicine? Take this medicine by mouth with a glass of water. Follow the directions on the prescription label. The tablets can be taken with or without food. Take your doses at regular intervals. Do not take your medicine more often than directed. A special MedGuide will be given to you by the pharmacist with each prescription and refill. Be sure to read this information carefully each time. Talk to your pediatrician regarding the use of this medicine in children. Special care may be needed. Overdosage: If you think you have taken too much of this medicine contact a poison control center or emergency room at once. NOTE: This medicine is only for you. Do not share this medicine with others. What if I miss a dose? If you miss a dose, take it as soon as you can. If it is almost time for your next dose, take only that dose. Do not take double or extra doses. What may interact with this medicine? -cholestyramine -female hormones, like  estrogens -warfarin This list may not describe all possible interactions. Give your health care provider a list of all the medicines, herbs, non-prescription drugs, or dietary supplements you use. Also tell them if you smoke, drink alcohol, or use illegal drugs. Some items may interact with your medicine. What should I watch for while using this medicine? Visit your doctor or health care professional for regular checks on your progress. Do not stop taking this medicine except on the advice of your doctor or health care professional. If you are taking this medicine to reduce your risk of getting breast cancer, you should know that this medicine does not prevent all types of breast cancer. Talk to your doctor if you have questions. This medicine does not prevent hot flashes. It may cause hot flashes in some patients at the start of therapy. You should make sure that you get enough calcium and vitamin D while you are taking this medicine. Discuss the foods you eat and the vitamins you take with your health care professional. Exercise may help to prevent bone loss. Discuss your exercise needs with your doctor or health care professional. This medicine can rarely cause blood clots. If you are going to have surgery, tell your doctor or health care professional that you are taking this medicine. This medicine should be stopped at least 3 days before surgery. After surgery, it should be restarted only after you are walking again. It should not be restarted while you still need long periods of bed rest. You should not smoke while taking this medicine. Smoking may increase your risk of blood clots or stroke.  If you have any reason to think you are pregnant; stop taking this medicine at once and contact your doctor or health care professional. Do not breast feed while taking this medicine. What side effects may I notice from receiving this medicine? Side effects that you should report to your doctor or health  care professional as soon as possible: -allergic reactions like skin rash, itching or hives, swelling of the face, lips, or tongue) -breast tissue changes or discharge -signs and symptoms of a blood clot such as breathing problems; changes in vision; chest pain; severe, sudden headache; pain, swelling, warmth in the leg; trouble speaking; sudden numbness or weakness of the face, arm or leg -signs and symptoms of a stroke like changes in vision; confusion; trouble speaking or understanding; severe headaches; sudden numbness or weakness of the face, arm or leg; trouble walking; dizziness; loss of balance or coordination -vaginal discharge that is bloody, brown, or rust Side effects that usually do not require medical attention (report to your doctor or health care professional if they continue or are bothersome): -hot flashes -joint pain -leg cramps -sweating -swelling of the ankles, feet, hands This list may not describe all possible side effects. Call your doctor for medical advice about side effects. You may report side effects to FDA at 1-800-FDA-1088. Where should I keep my medicine? Keep out of the reach of children. Store at room temperature between 15 and 30 degrees C (59 and 86 degrees F). Throw away any unused medicine after the expiration date. NOTE: This sheet is a summary. It may not cover all possible information. If you have questions about this medicine, talk to your doctor, pharmacist, or health care provider.  2019 Elsevier/Gold Standard (2016-03-04 17:15:34) Letrozole tablets What is this medicine? LETROZOLE (LET roe zole) blocks the production of estrogen. It is used to treat breast cancer. This medicine may be used for other purposes; ask your health care provider or pharmacist if you have questions. COMMON BRAND NAME(S): Femara What should I tell my health care provider before I take this medicine? They need to know if you have any of these conditions: -high  cholesterol -liver disease -osteoporosis (weak bones) -an unusual or allergic reaction to letrozole, other medicines, foods, dyes, or preservatives -pregnant or trying to get pregnant -breast-feeding How should I use this medicine? Take this medicine by mouth with a glass of water. You may take it with or without food. Follow the directions on the prescription label. Take your medicine at regular intervals. Do not take your medicine more often than directed. Do not stop taking except on your doctor's advice. Talk to your pediatrician regarding the use of this medicine in children. Special care may be needed. Overdosage: If you think you have taken too much of this medicine contact a poison control center or emergency room at once. NOTE: This medicine is only for you. Do not share this medicine with others. What if I miss a dose? If you miss a dose, take it as soon as you can. If it is almost time for your next dose, take only that dose. Do not take double or extra doses. What may interact with this medicine? Do not take this medicine with any of the following medications: -estrogens, like hormone replacement therapy or birth control pills This medicine may also interact with the following medications: -dietary supplements such as androstenedione or DHEA -prasterone -tamoxifen This list may not describe all possible interactions. Give your health care provider a list of all the  medicines, herbs, non-prescription drugs, or dietary supplements you use. Also tell them if you smoke, drink alcohol, or use illegal drugs. Some items may interact with your medicine. What should I watch for while using this medicine? Tell your doctor or healthcare professional if your symptoms do not start to get better or if they get worse. Do not become pregnant while taking this medicine or for 3 weeks after stopping it. Women should inform their doctor if they wish to become pregnant or think they might be pregnant.  There is a potential for serious side effects to an unborn child. Talk to your health care professional or pharmacist for more information. Do not breast-feed while taking this medicine or for 3 weeks after stopping it. This medicine may interfere with the ability to have a child. Talk with your doctor or health care professional if you are concerned about your fertility. Using this medicine for a long time may increase your risk of low bone mass. Talk to your doctor about bone health. You may get drowsy or dizzy. Do not drive, use machinery, or do anything that needs mental alertness until you know how this medicine affects you. Do not stand or sit up quickly, especially if you are an older patient. This reduces the risk of dizzy or fainting spells. You may need blood work done while you are taking this medicine. What side effects may I notice from receiving this medicine? Side effects that you should report to your doctor or health care professional as soon as possible: -allergic reactions like skin rash, itching, or hives -bone fracture -chest pain -signs and symptoms of a blood clot such as breathing problems; changes in vision; chest pain; severe, sudden headache; pain, swelling, warmth in the leg; trouble speaking; sudden numbness or weakness of the face, arm or leg -vaginal bleeding Side effects that usually do not require medical attention (report to your doctor or health care professional if they continue or are bothersome): -bone, back, joint, or muscle pain -dizziness -fatigue -fluid retention -headache -hot flashes, night sweats -nausea -weight gain This list may not describe all possible side effects. Call your doctor for medical advice about side effects. You may report side effects to FDA at 1-800-FDA-1088. Where should I keep my medicine? Keep out of the reach of children. Store between 15 and 30 degrees C (59 and 86 degrees F). Throw away any unused medicine after the  expiration date. NOTE: This sheet is a summary. It may not cover all possible information. If you have questions about this medicine, talk to your doctor, pharmacist, or health care provider.  2019 Elsevier/Gold Standard (2015-09-02 11:10:41) Tamoxifen oral tablet What is this medicine? TAMOXIFEN (ta MOX i fen) blocks the effects of estrogen. It is commonly used to treat breast cancer. It is also used to decrease the chance of breast cancer coming back in women who have received treatment for the disease. It may also help prevent breast cancer in women who have a high risk of developing breast cancer. This medicine may be used for other purposes; ask your health care provider or pharmacist if you have questions. COMMON BRAND NAME(S): Nolvadex What should I tell my health care provider before I take this medicine? They need to know if you have any of these conditions: -blood clots -blood disease -cataracts or impaired eyesight -endometriosis -high calcium levels -high cholesterol -irregular menstrual cycles -liver disease -stroke -uterine fibroids -an unusual reaction to tamoxifen, other medicines, foods, dyes, or preservatives -pregnant or trying  to get pregnant -breast-feeding How should I use this medicine? Take this medicine by mouth with a glass of water. Follow the directions on the prescription label. You can take it with or without food. Take your medicine at regular intervals. Do not take your medicine more often than directed. Do not stop taking except on your doctor's advice. A special MedGuide will be given to you by the pharmacist with each prescription and refill. Be sure to read this information carefully each time. Talk to your pediatrician regarding the use of this medicine in children. While this drug may be prescribed for selected conditions, precautions do apply. Overdosage: If you think you have taken too much of this medicine contact a poison control center or  emergency room at once. NOTE: This medicine is only for you. Do not share this medicine with others. What if I miss a dose? If you miss a dose, take it as soon as you can. If it is almost time for your next dose, take only that dose. Do not take double or extra doses. What may interact with this medicine? Do not take this medicine with any of the following medications: -cisapride -certain medicines for irregular heart beat like dofetilide, dronedarone, quinidine -certain medicines for fungal infection like fluconazole, posaconazole -pimozide -saquinavir -thioridazine This medicine may also interact with the following medications: -aminoglutethimide -anastrozole -bromocriptine -chemotherapy drugs -female hormones, like estrogens and birth control pills -letrozole -medroxyprogesterone -phenobarbital -rifampin -warfarin This list may not describe all possible interactions. Give your health care provider a list of all the medicines, herbs, non-prescription drugs, or dietary supplements you use. Also tell them if you smoke, drink alcohol, or use illegal drugs. Some items may interact with your medicine. What should I watch for while using this medicine? Visit your doctor or health care professional for regular checks on your progress. You will need regular pelvic exams, breast exams, and mammograms. If you are taking this medicine to reduce your risk of getting breast cancer, you should know that this medicine does not prevent all types of breast cancer. If breast cancer or other problems occur, there is no guarantee that it will be found at an early stage. Do not become pregnant while taking this medicine or for 2 months after stopping it. Women should inform their doctor if they wish to become pregnant or think they might be pregnant. There is a potential for serious side effects to an unborn child. Talk to your health care professional or pharmacist for more information. Do not breast-feed an  infant while taking this medicine or for 3 months after stopping it. This medicine may interfere with the ability to have a child. Talk with your doctor or health care professional if you are concerned about your fertility. What side effects may I notice from receiving this medicine? Side effects that you should report to your doctor or health care professional as soon as possible: -allergic reactions like skin rash, itching or hives, swelling of the face, lips, or tongue -changes in vision -changes in your menstrual cycle -difficulty walking or talking -new breast lumps -numbness -pelvic pain or pressure -redness, blistering, peeling or loosening of the skin, including inside the mouth -signs and symptoms of a dangerous change in heartbeat or heart rhythm like chest pain, dizziness, fast or irregular heartbeat, palpitations, feeling faint or lightheaded, falls, breathing problems -sudden chest pain -swelling, pain or tenderness in your calf or leg -unusual bruising or bleeding -vaginal discharge that is bloody, brown, or rust -weakness -  yellowing of the whites of the eyes or skin Side effects that usually do not require medical attention (report to your doctor or health care professional if they continue or are bothersome): -fatigue -hair loss, although uncommon and is usually mild -headache -hot flashes -impotence (in men) -nausea, vomiting (mild) -vaginal discharge (white or clear) This list may not describe all possible side effects. Call your doctor for medical advice about side effects. You may report side effects to FDA at 1-800-FDA-1088. Where should I keep my medicine? Keep out of the reach of children. Store at room temperature between 20 and 25 degrees C (68 and 77 degrees F). Protect from light. Keep container tightly closed. Throw away any unused medicine after the expiration date. NOTE: This sheet is a summary. It may not cover all possible information. If you have  questions about this medicine, talk to your doctor, pharmacist, or health care provider.  2019 Elsevier/Gold Standard (2017-05-19 16:49:57)

## 2018-03-14 ENCOUNTER — Encounter: Payer: Self-pay | Admitting: Internal Medicine

## 2018-03-14 DIAGNOSIS — E2839 Other primary ovarian failure: Secondary | ICD-10-CM

## 2018-03-14 NOTE — Telephone Encounter (Signed)
Pt would like estrogen levels checked with next blood draw. I have sent her a my chart message back regarding her bone density scan

## 2018-03-15 NOTE — Telephone Encounter (Signed)
I have placed the order for the bone density.  See my last message to her and then her response.  Need bone density scheduled at [facility]

## 2018-04-15 ENCOUNTER — Other Ambulatory Visit (INDEPENDENT_AMBULATORY_CARE_PROVIDER_SITE_OTHER): Payer: BC Managed Care – PPO

## 2018-04-15 DIAGNOSIS — I10 Essential (primary) hypertension: Secondary | ICD-10-CM

## 2018-04-15 DIAGNOSIS — R739 Hyperglycemia, unspecified: Secondary | ICD-10-CM | POA: Diagnosis not present

## 2018-04-15 DIAGNOSIS — E7800 Pure hypercholesterolemia, unspecified: Secondary | ICD-10-CM | POA: Diagnosis not present

## 2018-04-15 DIAGNOSIS — E559 Vitamin D deficiency, unspecified: Secondary | ICD-10-CM

## 2018-04-15 LAB — BASIC METABOLIC PANEL
BUN: 21 mg/dL (ref 6–23)
CO2: 26 mEq/L (ref 19–32)
Calcium: 9.5 mg/dL (ref 8.4–10.5)
Chloride: 100 mEq/L (ref 96–112)
Creatinine, Ser: 0.83 mg/dL (ref 0.40–1.20)
GFR: 69.15 mL/min (ref >60.00)
Glucose, Bld: 104 mg/dL — ABNORMAL HIGH (ref 70–99)
Potassium: 3.8 mEq/L (ref 3.5–5.1)
Sodium: 137 mEq/L (ref 135–145)

## 2018-04-15 LAB — CBC WITH DIFFERENTIAL/PLATELET
Basophils Absolute: 0.1 10*3/uL (ref 0.0–0.1)
Basophils Relative: 1 % (ref 0.0–3.0)
Eosinophils Absolute: 0.1 10*3/uL (ref 0.0–0.7)
Eosinophils Relative: 2.6 % (ref 0.0–5.0)
HCT: 41.1 % (ref 36.0–46.0)
Hemoglobin: 14.3 g/dL (ref 12.0–15.0)
Lymphocytes Relative: 30.9 % (ref 12.0–46.0)
Lymphs Abs: 1.7 10*3/uL (ref 0.7–4.0)
MCHC: 34.8 g/dL (ref 30.0–36.0)
MCV: 90.9 fl (ref 78.0–100.0)
Monocytes Absolute: 0.4 10*3/uL (ref 0.1–1.0)
Monocytes Relative: 6.5 % (ref 3.0–12.0)
NEUTROS PCT: 59 % (ref 43.0–77.0)
Neutro Abs: 3.2 10*3/uL (ref 1.4–7.7)
Platelets: 305 10*3/uL (ref 150.0–400.0)
RBC: 4.52 Mil/uL (ref 3.87–5.11)
RDW: 12.1 % (ref 11.5–15.5)
WBC: 5.5 10*3/uL (ref 4.0–10.5)

## 2018-04-15 LAB — HEPATIC FUNCTION PANEL
ALBUMIN: 4.4 g/dL (ref 3.5–5.2)
ALT: 33 U/L (ref 0–35)
AST: 22 U/L (ref 0–37)
Alkaline Phosphatase: 60 U/L (ref 39–117)
Bilirubin, Direct: 0.2 mg/dL (ref 0.0–0.3)
Total Bilirubin: 1.3 mg/dL — ABNORMAL HIGH (ref 0.2–1.2)
Total Protein: 7.2 g/dL (ref 6.0–8.3)

## 2018-04-15 LAB — VITAMIN D 25 HYDROXY (VIT D DEFICIENCY, FRACTURES): VITD: 29.77 ng/mL — ABNORMAL LOW (ref 30.00–100.00)

## 2018-04-15 LAB — LIPID PANEL
Cholesterol: 228 mg/dL — ABNORMAL HIGH (ref 0–200)
HDL: 56.7 mg/dL (ref >39.00)
LDL Cholesterol: 134 mg/dL — ABNORMAL HIGH (ref 0–99)
NONHDL: 171.21
Total CHOL/HDL Ratio: 4
Triglycerides: 184 mg/dL — ABNORMAL HIGH (ref 0.0–149.0)
VLDL: 36.8 mg/dL (ref 0.0–40.0)

## 2018-04-15 LAB — TSH: TSH: 1.64 u[IU]/mL (ref 0.35–4.50)

## 2018-04-15 LAB — HEMOGLOBIN A1C: Hgb A1c MFr Bld: 5.3 % (ref 4.6–6.5)

## 2018-04-18 ENCOUNTER — Other Ambulatory Visit: Payer: BC Managed Care – PPO

## 2018-04-20 ENCOUNTER — Other Ambulatory Visit: Payer: Self-pay | Admitting: Internal Medicine

## 2018-04-20 ENCOUNTER — Other Ambulatory Visit: Payer: Self-pay

## 2018-04-20 ENCOUNTER — Ambulatory Visit
Admission: RE | Admit: 2018-04-20 | Discharge: 2018-04-20 | Disposition: A | Discharge status: Home / Self Care | Payer: BC Managed Care – PPO | Source: Ambulatory Visit | Attending: Urgent Care | Admitting: Urgent Care

## 2018-04-20 DIAGNOSIS — N6092 Unspecified benign mammary dysplasia of left breast: Secondary | ICD-10-CM | POA: Diagnosis present

## 2018-04-20 MED ORDER — ALPRAZOLAM 0.25 MG PO TABS: 0.2500 mg | ORAL_TABLET | Freq: Every day | ORAL | 0 refills | Status: DC | PRN

## 2018-04-20 NOTE — Telephone Encounter (Signed)
Last OV 02/15/2018 Next OV 06/17/2018 Last refill 02/21/2018

## 2018-04-25 ENCOUNTER — Encounter: Payer: Self-pay | Admitting: Internal Medicine

## 2018-04-25 NOTE — Telephone Encounter (Signed)
I do not see that she has had either of the pneumonia shots but she will not be 75 until November of this year. Should she go ahead and get the prevnar since her husband is immunocompromised?

## 2018-05-09 ENCOUNTER — Other Ambulatory Visit: Payer: Self-pay | Admitting: Internal Medicine

## 2018-05-17 ENCOUNTER — Other Ambulatory Visit: Payer: Self-pay | Admitting: Internal Medicine

## 2018-05-18 MED ORDER — FLUOXETINE HCL 40 MG PO CAPS: 40.0000 mg | ORAL_CAPSULE | Freq: Every day | ORAL | 0 refills | Status: DC

## 2018-06-17 ENCOUNTER — Ambulatory Visit (INDEPENDENT_AMBULATORY_CARE_PROVIDER_SITE_OTHER): Payer: BC Managed Care – PPO | Admitting: Internal Medicine

## 2018-06-17 ENCOUNTER — Other Ambulatory Visit: Payer: Self-pay

## 2018-06-17 ENCOUNTER — Encounter: Payer: Self-pay | Admitting: Internal Medicine

## 2018-06-17 ENCOUNTER — Other Ambulatory Visit: Payer: BC Managed Care – PPO

## 2018-06-17 DIAGNOSIS — Z96651 Presence of right artificial knee joint: Secondary | ICD-10-CM

## 2018-06-17 DIAGNOSIS — I10 Essential (primary) hypertension: Secondary | ICD-10-CM

## 2018-06-17 DIAGNOSIS — R945 Abnormal results of liver function studies: Secondary | ICD-10-CM

## 2018-06-17 DIAGNOSIS — R319 Hematuria, unspecified: Secondary | ICD-10-CM | POA: Diagnosis not present

## 2018-06-17 DIAGNOSIS — N6092 Unspecified benign mammary dysplasia of left breast: Secondary | ICD-10-CM | POA: Diagnosis not present

## 2018-06-17 DIAGNOSIS — E559 Vitamin D deficiency, unspecified: Secondary | ICD-10-CM

## 2018-06-17 DIAGNOSIS — E7800 Pure hypercholesterolemia, unspecified: Secondary | ICD-10-CM

## 2018-06-17 DIAGNOSIS — R7989 Other specified abnormal findings of blood chemistry: Secondary | ICD-10-CM

## 2018-06-17 DIAGNOSIS — F419 Anxiety disorder, unspecified: Secondary | ICD-10-CM | POA: Diagnosis not present

## 2018-06-17 DIAGNOSIS — Z87440 Personal history of urinary (tract) infections: Secondary | ICD-10-CM

## 2018-06-17 NOTE — Progress Notes (Signed)
Patient ID: Jordan Benson, female   DOB: [birth year], 63 y.o.   MRN: [id number]   Virtual Visit via Video Note  This visit type was conducted due to national recommendations for restrictions regarding the COVID-19 pandemic (e.g. social distancing).  This format is felt to be most appropriate for this patient at this time.  All issues noted in this document were discussed and addressed.  No physical exam was performed (except for noted visual exam findings with Video Visits).   I connected with Jordan Benson by a video enabled telemedicine application and verified that I am speaking with the correct person using two identifiers. Location patient: home Location provider: work Persons participating in the virtual visit: patient, provider  I discussed the limitations, risks, security and privacy concerns of performing an evaluation and management service by video and the availability of in person appointments. The patient expressed understanding and agreed to proceed.   Reason for visit: scheduled follow up.   HPI: She reports she is doing relatively well.  Just returned from [place].  Was staying at her house there for several weeks.  She wanted to get away for a while.  Feels better.  Handling stress.  On prozac.  States she was evaluated recently and diagnosed with UTI.  Treated.  Request to have urine rechecked.  Trying to stay active.  No chest pain.  No sob.  No acid reflux.  No abdominal pain.  Bowels moving.  She is exercising.  Had appt with oncology for evaluation atypical hyperplasia s/p lumpectomy.  Request f/u with Dr Rogue Bussing here in [place].  Wants to discuss further treatment.  Knee is doing well.  She is s/p RTKR.     ROS: See pertinent positives and negatives per HPI.  Past Medical History:  Diagnosis Date  . Anxiety   . Breast cancer ([place]) 12/2017  . Complication of anesthesia   . GERD (gastroesophageal reflux disease)   . Headache    optical migraines  .  Hypertension   . IBS (irritable bowel syndrome)   . Melanoma ([place]) 2019   followed by Dr Evorn Gong, on left arm  . PONV (postoperative nausea and vomiting)   . Vitamin D deficiency     Past Surgical History:  Procedure Laterality Date  . ABDOMINAL HYSTERECTOMY  1992  . BREAST BIOPSY Left 12/21/2017   affirm bx of asymmetry, ATYPICAL DUCTAL PAPILLARY LESION WITH ADJACENT ATYPICAL DUCTAL HYPERPLASIA.   Marland Kitchen BREAST BIOPSY Left 12/21/2017   [place] bx of LN, benign  . BREAST LUMPECTOMY Left 01/05/2018   ADH  . BREAST LUMPECTOMY WITH NEEDLE LOCALIZATION Left 01/05/2018   Procedure: BREAST LUMPECTOMY WITH NEEDLE LOCALIZATION;  Surgeon: Olean Ree, MD;  Location: [facility] ORS;  Service: General;  Laterality: Left;  . BREAST SURGERY    . JOINT REPLACEMENT Right 10/2017   TKR  . MOHS SURGERY  2012  . OVARIAN CYST REMOVAL  1991  . RE-EXCISION OF BREAST LUMPECTOMY Left 01/12/2018   Procedure: RE-EXCISION OF BREAST LUMPECTOMY;  Surgeon: Olean Ree, MD;  Location: [facility] ORS;  Service: General;  Laterality: Left;  . SEPTOPLASTY  2009  . TOTAL KNEE 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   Procedure: RIGHT TOTAL KNEE ARTHROPLASTY;  Surgeon: Sydnee Cabal, MD;  Location: [facility] ORS;  Service: Orthopedics;  Laterality: Right;  2 hrs  . TUBAL LIGATION  1985  . tummy tuck  1995  . WISDOM TOOTH EXTRACTION      Family History  Problem Relation Age of Onset  . Cancer  Mother 53       breast cancer  . Heart disease Mother   . Hypertension Mother   . Diabetes Mother   . Breast cancer Mother 6  . Heart disease Father   . Breast cancer Sister 52       on HRT    SOCIAL HX: reviewed.    Current Outpatient Medications:  .  ALPRAZolam (XANAX) 0.25 MG tablet, Take 1 tablet (0.25 mg total) by mouth daily as needed. for anxiety, Disp: 30 tablet, Rfl: 0 .  amLODipine (NORVASC) 5 MG tablet, TAKE 1 TABLET(5 MG) BY MOUTH DAILY( GENERIC EQUIVALENT TO NORVASC), Disp: 90 tablet, Rfl: 1 .  calcium elemental as carbonate (TUMS  ULTRA 1000) 400 MG chewable tablet, Chew 1,000 mg by mouth daily as needed for heartburn. , Disp: , Rfl:  .  enalapril (VASOTEC) 10 MG tablet, TAKE 1 TABLET(10 MG) BY MOUTH TWICE DAILY (Patient taking differently: Take 10 mg by mouth daily. ), Disp: 180 tablet, Rfl: 2 .  FLUoxetine (PROZAC) 40 MG capsule, Take 1 capsule (40 mg total) by mouth daily., Disp: 90 capsule, Rfl: 0 .  Multiple Vitamins-Calcium (ONE-A-DAY WOMENS PO), Take 1 tablet by mouth daily., Disp: , Rfl:  .  triamterene-hydrochlorothiazide (MAXZIDE-25) 37.5-25 MG tablet, TAKE 1 TABLET BY MOUTH DAILY, Disp: 90 tablet, Rfl: 1  EXAM:  GENERAL: alert, oriented, appears well and in no acute distress  HEENT: atraumatic, conjunttiva clear, no obvious abnormalities on inspection of external nose and ears  NECK: normal movements of the head and neck  LUNGS: on inspection no signs of respiratory distress, breathing rate appears normal, no obvious gross SOB, gasping or wheezing  CV: no obvious cyanosis  PSYCH/NEURO: pleasant and cooperative, no obvious depression or anxiety, speech and thought processing grossly intact  ASSESSMENT AND PLAN:  Discussed the following assessment and plan:  Hematuria, unspecified type - Plan: Urinalysis, Routine w reflex microscopic, Urine Culture  Abnormal liver function test  Anxiety  Atypical ductal hyperplasia of left breast  Essential hypertension, benign  Hypercholesterolemia  Status post right knee replacement  Vitamin D deficiency  History of UTI - Recent UTI.  Treated.  Recheck urinalysis.    Abnormal liver function test Discussed decreasing alcohol intake.  Discussed diet and exercise.  Follow liver function tests.    Anxiety On prozac.  Feels she is doing well.  Follow.    Atypical ductal hyperplasia of left breast Pathology revealed atypical ductal hyperplasia.  Saw oncology.  Discussed treatment options.  She is hesitant about starting medication.  Discussed f/u with  oncology for more discussion regarding hormonal therapy.  She request f/u in [facility] - Dr Rogue Bussing.    Essential hypertension, benign Blood pressure has been under good control.  Continue same medication regimen.  Follow pressures.  Follow metabolic panel.    Hypercholesterolemia Low cholesterol diet and exercise.  Follow lipid panel.  Discussed lab results.    S/P knee replacement Doing well s/p RTKR.  Follow.    Vitamin D deficiency Follow vitamin D level.      I discussed the assessment and treatment plan with the patient. The patient was provided an opportunity to ask questions and all were answered. The patient agreed with the plan and demonstrated an understanding of the instructions.   The patient was advised to call back or seek an in-person evaluation if the symptoms worsen or if the condition fails to improve as anticipated.    Einar Pheasant, MD

## 2018-06-17 NOTE — Addendum Note (Signed)
Addended by: Arby Barrette on: 06/17/2018 02:06 PM   Modules accepted: Orders

## 2018-06-18 ENCOUNTER — Encounter: Payer: Self-pay | Admitting: Internal Medicine

## 2018-06-18 LAB — URINALYSIS, ROUTINE W REFLEX MICROSCOPIC
Bilirubin, UA: NEGATIVE
Glucose, UA: NEGATIVE
Ketones, UA: NEGATIVE
Leukocytes,UA: NEGATIVE
Nitrite, UA: NEGATIVE
Protein,UA: NEGATIVE
RBC, UA: NEGATIVE
Specific Gravity, UA: 1.016 (ref 1.005–1.030)
Urobilinogen, Ur: 0.2 mg/dL (ref 0.2–1.0)
pH, UA: 6 (ref 5.0–7.5)

## 2018-06-18 LAB — URINE CULTURE
MICRO NUMBER:: 458198
Result:: NO GROWTH
SPECIMEN QUALITY:: ADEQUATE

## 2018-06-19 ENCOUNTER — Encounter: Payer: Self-pay | Admitting: Internal Medicine

## 2018-06-19 NOTE — Assessment & Plan Note (Signed)
Follow vitamin D level.  

## 2018-06-19 NOTE — Assessment & Plan Note (Signed)
On prozac.  Feels she is doing well.  Follow.

## 2018-06-19 NOTE — Assessment & Plan Note (Signed)
Blood pressure has been under good control.  Continue same medication regimen.  Follow pressures.  Follow metabolic panel.   

## 2018-06-19 NOTE — Assessment & Plan Note (Signed)
Pathology revealed atypical ductal hyperplasia.  Saw oncology.  Discussed treatment options.  She is hesitant about starting medication.  Discussed f/u with oncology for more discussion regarding hormonal therapy.  She request f/u in [facility] - Dr Rogue Bussing.

## 2018-06-19 NOTE — Assessment & Plan Note (Signed)
Doing well s/p RTKR.  Follow.

## 2018-06-19 NOTE — Assessment & Plan Note (Signed)
Discussed decreasing alcohol intake.  Discussed diet and exercise.  Follow liver function tests.

## 2018-06-19 NOTE — Assessment & Plan Note (Signed)
Low cholesterol diet and exercise.  Follow lipid panel.  Discussed lab results.

## 2018-07-11 ENCOUNTER — Other Ambulatory Visit: Payer: Self-pay

## 2018-07-12 ENCOUNTER — Other Ambulatory Visit: Payer: Self-pay

## 2018-07-12 ENCOUNTER — Encounter: Payer: Self-pay | Admitting: Internal Medicine

## 2018-07-12 ENCOUNTER — Inpatient Hospital Stay: Payer: BC Managed Care – PPO | Attending: Internal Medicine | Admitting: Internal Medicine

## 2018-07-12 DIAGNOSIS — Z803 Family history of malignant neoplasm of breast: Secondary | ICD-10-CM | POA: Insufficient documentation

## 2018-07-12 DIAGNOSIS — Z87891 Personal history of nicotine dependence: Secondary | ICD-10-CM | POA: Diagnosis not present

## 2018-07-12 DIAGNOSIS — K219 Gastro-esophageal reflux disease without esophagitis: Secondary | ICD-10-CM | POA: Diagnosis not present

## 2018-07-12 DIAGNOSIS — I10 Essential (primary) hypertension: Secondary | ICD-10-CM | POA: Diagnosis not present

## 2018-07-12 DIAGNOSIS — Z79899 Other long term (current) drug therapy: Secondary | ICD-10-CM | POA: Diagnosis not present

## 2018-07-12 DIAGNOSIS — K589 Irritable bowel syndrome without diarrhea: Secondary | ICD-10-CM | POA: Diagnosis not present

## 2018-07-12 DIAGNOSIS — Z85820 Personal history of malignant melanoma of skin: Secondary | ICD-10-CM | POA: Diagnosis not present

## 2018-07-12 DIAGNOSIS — N6092 Unspecified benign mammary dysplasia of left breast: Secondary | ICD-10-CM | POA: Diagnosis present

## 2018-07-12 DIAGNOSIS — F419 Anxiety disorder, unspecified: Secondary | ICD-10-CM | POA: Diagnosis not present

## 2018-07-12 DIAGNOSIS — E559 Vitamin D deficiency, unspecified: Secondary | ICD-10-CM | POA: Insufficient documentation

## 2018-07-12 MED ORDER — EXEMESTANE 25 MG PO TABS: 25.0000 mg | ORAL_TABLET | Freq: Every day | ORAL | 4 refills | Status: DC

## 2018-07-12 NOTE — Progress Notes (Signed)
[facility] CONSULT NOTE  Patient Care Team: Einar Pheasant, MD as PCP - General (Internal Medicine)  CHIEF COMPLAINTS/PURPOSE OF CONSULTATION:  Atypical ductal hyperplasia  # LEFT BREAST ATYPICAL DUCTAL HYPERPLASIA  #   No history exists.     HISTORY OF PRESENTING ILLNESS:  Jordan Benson 63 y.o.  female with a history of newly diagnosed left breast atypical ductal hyperplasia-is here for follow-up.  Patient was recommended tamoxifen by Dr. Mike Gip.  Patient reluctant-to use tamoxifen given her sister's intolerance/side effects.  Patient otherwise feels good.  Denies any new complaints.  No joint pains.  No nausea no vomiting.  No significant hot flashes.  Review of Systems  Constitutional: Negative for chills, diaphoresis, fever, malaise/fatigue and weight loss.  HENT: Negative for nosebleeds and sore throat.   Eyes: Negative for double vision.  Respiratory: Negative for cough, hemoptysis, sputum production, shortness of breath and wheezing.   Cardiovascular: Negative for chest pain, palpitations, orthopnea and leg swelling.  Gastrointestinal: Negative for abdominal pain, blood in stool, constipation, diarrhea, heartburn, melena, nausea and vomiting.  Genitourinary: Negative for dysuria, frequency and urgency.  Musculoskeletal: Negative for back pain and joint pain.  Skin: Negative.  Negative for itching and rash.  Neurological: Negative for dizziness, tingling, focal weakness, weakness and headaches.  Endo/Heme/Allergies: Does not bruise/bleed easily.  Psychiatric/Behavioral: Negative for depression. The patient is not nervous/anxious and does not have insomnia.      MEDICAL HISTORY:  Past Medical History:  Diagnosis Date  . Anxiety   . Breast cancer ([place]) 12/2017  . Complication of anesthesia   . GERD (gastroesophageal reflux disease)   . Headache    optical migraines  . Hypertension   . IBS (irritable bowel syndrome)   . Melanoma ([place]) 2019   followed by Dr Evorn Gong, on left arm  . PONV (postoperative nausea and vomiting)   . Vitamin D deficiency     SURGICAL HISTORY: Past Surgical History:  Procedure Laterality Date  . ABDOMINAL HYSTERECTOMY  1992  . BREAST BIOPSY Left 12/21/2017   affirm bx of asymmetry, ATYPICAL DUCTAL PAPILLARY LESION WITH ADJACENT ATYPICAL DUCTAL HYPERPLASIA.   Marland Kitchen BREAST BIOPSY Left 12/21/2017   [place] bx of LN, benign  . BREAST LUMPECTOMY Left 01/05/2018   ADH  . BREAST LUMPECTOMY WITH NEEDLE LOCALIZATION Left 01/05/2018   Procedure: BREAST LUMPECTOMY WITH NEEDLE LOCALIZATION;  Surgeon: Olean Ree, MD;  Location: [facility] ORS;  Service: General;  Laterality: Left;  . BREAST SURGERY    . JOINT REPLACEMENT Right 10/2017   TKR  . MOHS SURGERY  2012  . OVARIAN CYST REMOVAL  1991  . RE-EXCISION OF BREAST LUMPECTOMY Left 01/12/2018   Procedure: RE-EXCISION OF BREAST LUMPECTOMY;  Surgeon: Olean Ree, MD;  Location: [facility] ORS;  Service: General;  Laterality: Left;  . SEPTOPLASTY  2009  . TOTAL KNEE 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   Procedure: RIGHT TOTAL KNEE ARTHROPLASTY;  Surgeon: Sydnee Cabal, MD;  Location: [facility];  Service: Orthopedics;  Laterality: Right;  2 hrs  . TUBAL LIGATION  1985  . tummy tuck  1995  . WISDOM TOOTH EXTRACTION      SOCIAL HISTORY: Social History   Socioeconomic History  . Marital status: Married    Spouse name: Iona Beard  . Number of children: 2  . Years of education: Not on file  . Highest education level: Not on file  Occupational History    Employer: [employer]  . Financial resource strain: Not on file  .  Food insecurity:    Worry: Not on file    Inability: Not on file  . Transportation needs:    Medical: Not on file    Non-medical: Not on file  Tobacco Use  . Smoking status: Former Smoker    Packs/day: 0.50    Years: 10.00    Pack years: 5.00    Types: Cigarettes    Last attempt to quit: 03/12/2000    Years since quitting: 18.3  . Smokeless tobacco:  Never Used  Substance and Sexual Activity  . Alcohol use: Yes    Alcohol/week: 1.0 standard drinks    Types: 1 Standard drinks or equivalent per week    Comment: wine daily  . Drug use: No  . Sexual activity: Not on file  Lifestyle  . Physical activity:    Days per week: Not on file    Minutes per session: Not on file  . Stress: Not on file  Relationships  . Social connections:    Talks on phone: Not on file    Gets together: Not on file    Attends religious service: Not on file    Active member of club or organization: Not on file    Attends meetings of clubs or organizations: Not on file    Relationship status: Not on file  . Intimate partner violence:    Fear of current or ex partner: Not on file    Emotionally abused: Not on file    Physically abused: Not on file    Forced sexual activity: Not on file  Other Topics Concern  . Not on file  Social History Narrative  . Not on file    FAMILY HISTORY: Family History  Problem Relation Age of Onset  . Cancer Mother 51       breast cancer  . Heart disease Mother   . Hypertension Mother   . Diabetes Mother   . Breast cancer Mother 42  . Heart disease Father   . Breast cancer Sister 2       on HRT    ALLERGIES:  has No Known Allergies.  MEDICATIONS:  Current Outpatient Medications  Medication Sig Dispense Refill  . ALPRAZolam (XANAX) 0.25 MG tablet Take 1 tablet (0.25 mg total) by mouth daily as needed. for anxiety 30 tablet 0  . amLODipine (NORVASC) 5 MG tablet TAKE 1 TABLET(5 MG) BY MOUTH DAILY( GENERIC EQUIVALENT TO NORVASC) 90 tablet 1  . calcium elemental as carbonate (TUMS ULTRA 1000) 400 MG chewable tablet Chew 1,000 mg by mouth daily as needed for heartburn.     . enalapril (VASOTEC) 10 MG tablet TAKE 1 TABLET(10 MG) BY MOUTH TWICE DAILY (Patient taking differently: Take 10 mg by mouth daily. ) 180 tablet 2  . FLUoxetine (PROZAC) 40 MG capsule Take 1 capsule (40 mg total) by mouth daily. 90 capsule 0  .  Multiple Vitamins-Calcium (ONE-A-DAY WOMENS PO) Take 1 tablet by mouth daily.    Marland Kitchen triamterene-hydrochlorothiazide (MAXZIDE-25) 37.5-25 MG tablet TAKE 1 TABLET BY MOUTH DAILY 90 tablet 1  . exemestane (AROMASIN) 25 MG tablet Take 1 tablet (25 mg total) by mouth daily after breakfast. 30 tablet 4   No current facility-administered medications for this visit.       Marland Kitchen  PHYSICAL EXAMINATION: ECOG PERFORMANCE STATUS: 0 - Asymptomatic  Vitals:   07/12/18 1129  BP: 122/75  Pulse: 84  Resp: 20  Temp: 99.1 F (37.3 C)   Filed Weights   07/12/18 1129  Weight:  208 lb (94.3 kg)    Physical Exam  Constitutional: She is oriented to person, place, and time and well-developed, well-nourished, and in no distress.  HENT:  Head: Normocephalic and atraumatic.  Mouth/Throat: Oropharynx is clear and moist. No oropharyngeal exudate.  Eyes: Pupils are equal, round, and reactive to light.  Neck: Normal range of motion. Neck supple.  Cardiovascular: Normal rate and regular rhythm.  Pulmonary/Chest: Effort normal and breath sounds normal. No respiratory distress. She has no wheezes.  Abdominal: Soft. Bowel sounds are normal. She exhibits no distension and no mass. There is no abdominal tenderness. There is no rebound and no guarding.  Musculoskeletal: Normal range of motion.        General: No tenderness or edema.  Neurological: She is alert and oriented to person, place, and time.  Skin: Skin is warm.  Psychiatric: Affect normal.     LABORATORY DATA:  I have reviewed the data as listed Lab Results  Component Value Date   WBC 5.5 04/15/2018   HGB 14.3 04/15/2018   HCT 41.1 04/15/2018   MCV 90.9 04/15/2018   PLT 305.0 04/15/2018   Recent Labs    08/13/17 1210 08/28/17 0452 01/05/18 1120 04/15/18 0830  NA 138 140 140 137  K 4.6 4.0 3.8 3.8  CL 100 104  --  100  CO2 28 27  --  26  GLUCOSE 107* 136* 98 104*  BUN 20 16  --  21  CREATININE 0.92 0.71  --  0.83  CALCIUM 9.6 8.7*  --   9.5  GFRNONAA >60 >60  --   --   GFRAA >60 >60  --   --   PROT  --   --   --  7.2  ALBUMIN  --   --   --  4.4  AST  --   --   --  22  ALT  --   --   --  33  ALKPHOS  --   --   --  60  BILITOT  --   --   --  1.3*  BILIDIR  --   --   --  0.2    RADIOGRAPHIC STUDIES: I have personally reviewed the radiological images as listed and agreed with the findings in the report. No results found.  ASSESSMENT & PLAN:   Atypical ductal hyperplasia of left breast #Left breast atypical ductal hyperplasia.  Discussed the small but significant role of atypical ductal hyperplasia being a precursor lesion for development of breast cancer.  #Also discussed the role of antihormone therapy; is concerned about use of tamoxifen given her sister's poor tolerance to tamoxifen.  After lengthy discussion patient agreed to proceed with Aromasin.  New prescription sent.  #Had a long discussion regarding use of alternative antihormone therapy including Aromasin.  Discussed the potential side effects including but not limited to hot flashes and osteoporosis.  No interaction with Prozac.  # Anxiety- on Prozac [10 years]-clinically stable.  # DISPOSITION: # follow up in 5 weeks- MD- [facility]- no labs- Dr.B  All questions were answered. The patient knows to call the clinic with any problems, questions or concerns.    Cammie Sickle, MD 07/12/2018 2:59 PM

## 2018-07-12 NOTE — Assessment & Plan Note (Addendum)
#  Left breast atypical ductal hyperplasia.  Discussed the small but significant role of atypical ductal hyperplasia being a precursor lesion for development of breast cancer.  #Also discussed the role of antihormone therapy; is concerned about use of tamoxifen given her sister's poor tolerance to tamoxifen.  After lengthy discussion patient agreed to proceed with Aromasin.  New prescription sent.  #Had a long discussion regarding use of alternative antihormone therapy including Aromasin.  Discussed the potential side effects including but not limited to hot flashes and osteoporosis.  No interaction with Prozac.  # Anxiety- on Prozac [10 years]-clinically stable.  # DISPOSITION: # follow up in 5 weeks- MD- [facility]- no labs- Dr.B

## 2018-07-14 ENCOUNTER — Other Ambulatory Visit: Payer: Self-pay | Admitting: *Deleted

## 2018-07-14 ENCOUNTER — Encounter: Payer: Self-pay | Admitting: Internal Medicine

## 2018-07-14 MED ORDER — EXEMESTANE 25 MG PO TABS: 25.0000 mg | ORAL_TABLET | Freq: Every day | ORAL | 4 refills | Status: DC

## 2018-07-25 ENCOUNTER — Other Ambulatory Visit: Payer: Self-pay | Admitting: Internal Medicine

## 2018-07-26 NOTE — Telephone Encounter (Signed)
Refilled: 04/20/2018 Last OV: 06/17/2018 Next OV: 11/04/2018

## 2018-07-27 MED ORDER — ALPRAZOLAM 0.25 MG PO TABS: 0.2500 mg | ORAL_TABLET | Freq: Every day | ORAL | 0 refills | Status: DC | PRN

## 2018-08-07 ENCOUNTER — Other Ambulatory Visit: Payer: Self-pay | Admitting: Internal Medicine

## 2018-08-16 ENCOUNTER — Other Ambulatory Visit: Payer: Self-pay

## 2018-08-16 ENCOUNTER — Inpatient Hospital Stay: Payer: BC Managed Care – PPO | Attending: Internal Medicine | Admitting: Internal Medicine

## 2018-08-16 DIAGNOSIS — N6092 Unspecified benign mammary dysplasia of left breast: Secondary | ICD-10-CM | POA: Diagnosis present

## 2018-08-16 DIAGNOSIS — K219 Gastro-esophageal reflux disease without esophagitis: Secondary | ICD-10-CM | POA: Insufficient documentation

## 2018-08-16 DIAGNOSIS — E559 Vitamin D deficiency, unspecified: Secondary | ICD-10-CM | POA: Diagnosis not present

## 2018-08-16 DIAGNOSIS — F419 Anxiety disorder, unspecified: Secondary | ICD-10-CM

## 2018-08-16 DIAGNOSIS — M8580 Other specified disorders of bone density and structure, unspecified site: Secondary | ICD-10-CM | POA: Diagnosis not present

## 2018-08-16 DIAGNOSIS — Z87891 Personal history of nicotine dependence: Secondary | ICD-10-CM | POA: Diagnosis not present

## 2018-08-16 DIAGNOSIS — Z79811 Long term (current) use of aromatase inhibitors: Secondary | ICD-10-CM | POA: Insufficient documentation

## 2018-08-16 DIAGNOSIS — Z79899 Other long term (current) drug therapy: Secondary | ICD-10-CM | POA: Diagnosis not present

## 2018-08-16 DIAGNOSIS — Z803 Family history of malignant neoplasm of breast: Secondary | ICD-10-CM | POA: Diagnosis not present

## 2018-08-16 DIAGNOSIS — Z85820 Personal history of malignant melanoma of skin: Secondary | ICD-10-CM | POA: Insufficient documentation

## 2018-08-16 DIAGNOSIS — I10 Essential (primary) hypertension: Secondary | ICD-10-CM | POA: Diagnosis not present

## 2018-08-16 NOTE — Progress Notes (Signed)
For the last couple of weeks patient has been having left breast soreness/tenderness.

## 2018-08-16 NOTE — Assessment & Plan Note (Addendum)
#  Left breast atypical ductal hyperplasia-currently on antihormone therapy with Aromasin.  Tolerating well without any major side effects.  Will check with mammography regarding mammogram.  #Left nipple tenderness-no masses felt/no skin changes.  Question dryness of the nipple recommend moisturizing lotion.  Informed to let us know if her symptoms do not improve in the next 1 week or so.  # BMD march 2020- Osteopenia- T score= -1.9.  Recommend calcium plus vitamin D plus physical activity/exercise.  Patient agrees.  # DISPOSITION: # follow up in 6 months/ no labs;  Dr.B

## 2018-08-16 NOTE — Progress Notes (Signed)
[facility] CONSULT NOTE  Patient Care Team: Einar Pheasant, MD as PCP - General (Internal Medicine)  CHIEF COMPLAINTS/PURPOSE OF CONSULTATION: Atypical ductal hyperplasia  # LEFT BREAST ATYPICAL DUCTAL HYPERPLASIA- June 2020 started Aromasin [declined tamoxifen because sister's intolerance or side effects]  #  Oncology History   No history exists.     HISTORY OF PRESENTING ILLNESS:  Jordan Benson 63 y.o.  female with  left breast atypical ductal hyperplasia-currently on Aromasin is here for follow-up.  Patient denies any joint pains but denies any unusual fatigue.  Denies any night sweats.  Patient noted to have discomfort in the left breast around the nipple in the last 2 weeks.  Denies any discharge.  No lumps or bumps.  Review of Systems  Constitutional: Negative for chills, diaphoresis, fever, malaise/fatigue and weight loss.  HENT: Negative for nosebleeds and sore throat.   Eyes: Negative for double vision.  Respiratory: Negative for cough, hemoptysis, sputum production, shortness of breath and wheezing.   Cardiovascular: Negative for chest pain, palpitations, orthopnea and leg swelling.  Gastrointestinal: Negative for abdominal pain, blood in stool, constipation, diarrhea, heartburn, melena, nausea and vomiting.  Genitourinary: Negative for dysuria, frequency and urgency.  Musculoskeletal: Negative for back pain and joint pain.  Skin: Negative.  Negative for itching and rash.  Neurological: Negative for dizziness, tingling, focal weakness, weakness and headaches.  Endo/Heme/Allergies: Does not bruise/bleed easily.  Psychiatric/Behavioral: Negative for depression. The patient is not nervous/anxious and does not have insomnia.        MEDICAL HISTORY:  Past Medical History:  Diagnosis Date  . Anxiety   . Breast cancer ([place]) 12/2017  . Complication of anesthesia   . GERD (gastroesophageal reflux disease)   . Headache    optical migraines  .  Hypertension   . IBS (irritable bowel syndrome)   . Melanoma ([place]) 2019   followed by Dr Evorn Gong, on left arm  . PONV (postoperative nausea and vomiting)   . Vitamin D deficiency     SURGICAL HISTORY: Past Surgical History:  Procedure Laterality Date  . ABDOMINAL HYSTERECTOMY  1992  . BREAST BIOPSY Left 12/21/2017   affirm bx of asymmetry, ATYPICAL DUCTAL PAPILLARY LESION WITH ADJACENT ATYPICAL DUCTAL HYPERPLASIA.   Marland Kitchen BREAST BIOPSY Left 12/21/2017   [place] bx of LN, benign  . BREAST LUMPECTOMY Left 01/05/2018   ADH  . BREAST LUMPECTOMY WITH NEEDLE LOCALIZATION Left 01/05/2018   Procedure: BREAST LUMPECTOMY WITH NEEDLE LOCALIZATION;  Surgeon: Olean Ree, MD;  Location: [facility] ORS;  Service: General;  Laterality: Left;  . BREAST SURGERY    . JOINT REPLACEMENT Right 10/2017   TKR  . MOHS SURGERY  2012  . OVARIAN CYST REMOVAL  1991  . RE-EXCISION OF BREAST LUMPECTOMY Left 01/12/2018   Procedure: RE-EXCISION OF BREAST LUMPECTOMY;  Surgeon: Olean Ree, MD;  Location: [facility] ORS;  Service: General;  Laterality: Left;  . SEPTOPLASTY  2009  . TOTAL KNEE 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   Procedure: RIGHT TOTAL KNEE ARTHROPLASTY;  Surgeon: Sydnee Cabal, MD;  Location: [facility];  Service: Orthopedics;  Laterality: Right;  2 hrs  . TUBAL LIGATION  1985  . tummy tuck  1995  . WISDOM TOOTH EXTRACTION      SOCIAL HISTORY: Social History   Socioeconomic History  . Marital status: Married    Spouse name: Iona Beard  . Number of children: 2  . Years of education: Not on file  . Highest education level: Not on file  Occupational History  Employer: [employer]  Social Needs  . Financial resource strain: Not on file  . Food insecurity    Worry: Not on file    Inability: Not on file  . Transportation needs    Medical: Not on file    Non-medical: Not on file  Tobacco Use  . Smoking status: Former Smoker    Packs/day: 0.50    Years: 10.00    Pack years: 5.00    Types: Cigarettes    Quit date:  03/12/2000    Years since quitting: 18.4  . Smokeless tobacco: Never Used  Substance and Sexual Activity  . Alcohol use: Yes    Alcohol/week: 1.0 standard drinks    Types: 1 Standard drinks or equivalent per week    Comment: wine daily  . Drug use: No  . Sexual activity: Not on file  Lifestyle  . Physical activity    Days per week: Not on file    Minutes per session: Not on file  . Stress: Not on file  Relationships  . Social Herbalist on phone: Not on file    Gets together: Not on file    Attends religious service: Not on file    Active member of club or organization: Not on file    Attends meetings of clubs or organizations: Not on file    Relationship status: Not on file  . Intimate partner violence    Fear of current or ex partner: Not on file    Emotionally abused: Not on file    Physically abused: Not on file    Forced sexual activity: Not on file  Other Topics Concern  . Not on file  Social History Narrative  . Not on file    FAMILY HISTORY: Family History  Problem Relation Age of Onset  . Cancer Mother 67       breast cancer  . Heart disease Mother   . Hypertension Mother   . Diabetes Mother   . Breast cancer Mother 34  . Heart disease Father   . Breast cancer Sister 63       on HRT    ALLERGIES:  has No Known Allergies.  MEDICATIONS:  Current Outpatient Medications  Medication Sig Dispense Refill  . ALPRAZolam (XANAX) 0.25 MG tablet Take 1 tablet (0.25 mg total) by mouth daily as needed. for anxiety 30 tablet 0  . amLODipine (NORVASC) 5 MG tablet TAKE 1 TABLET BY MOUTH DAILY( GENERIC EQUIVALENT FOR NORVASC) 90 tablet 1  . calcium elemental as carbonate (TUMS ULTRA 1000) 400 MG chewable tablet Chew 1,000 mg by mouth daily as needed for heartburn.     . enalapril (VASOTEC) 10 MG tablet TAKE 1 TABLET(10 MG) BY MOUTH TWICE DAILY (Patient taking differently: Take 10 mg by mouth daily. ) 180 tablet 2  . exemestane (AROMASIN) 25 MG tablet Take 1  tablet (25 mg total) by mouth daily after breakfast. 30 tablet 4  . FLUoxetine (PROZAC) 40 MG capsule Take 1 capsule (40 mg total) by mouth daily. 90 capsule 0  . Multiple Vitamins-Calcium (ONE-A-DAY WOMENS PO) Take 1 tablet by mouth daily.    Marland Kitchen triamterene-hydrochlorothiazide (MAXZIDE-25) 37.5-25 MG tablet TAKE 1 TABLET BY MOUTH DAILY 90 tablet 1   No current facility-administered medications for this visit.       Marland Kitchen  PHYSICAL EXAMINATION: ECOG PERFORMANCE STATUS: 0 - Asymptomatic  Vitals:   08/16/18 1306  BP: 117/79  Pulse: 81  Resp: 18  Temp: (!) 97.5  F (36.4 C)   Filed Weights   08/16/18 1306  Weight: 207 lb 8 oz (94.1 kg)    Physical Exam  Constitutional: She is oriented to person, place, and time and well-developed, well-nourished, and in no distress.  HENT:  Head: Normocephalic and atraumatic.  Mouth/Throat: Oropharynx is clear and moist. No oropharyngeal exudate.  Eyes: Pupils are equal, round, and reactive to light.  Neck: Normal range of motion. Neck supple.  Cardiovascular: Normal rate and regular rhythm.  Pulmonary/Chest: Effort normal and breath sounds normal. No respiratory distress. She has no wheezes.  Abdominal: Soft. Bowel sounds are normal. She exhibits no distension and no mass. There is no abdominal tenderness. There is no rebound and no guarding.  Musculoskeletal: Normal range of motion.        General: No tenderness or edema.  Neurological: She is alert and oriented to person, place, and time.  Skin: Skin is warm.  Left breast exam shows-left lumpectomy incision well healing.  Mild tenderness without any mass noted on the 3:00 portion of the nipple.  Dryness of the nipple noted.  No discharge noted.  No skin changes noted otherwise.  Psychiatric: Affect normal.      LABORATORY DATA:  I have reviewed the data as listed Lab Results  Component Value Date   WBC 5.5 04/15/2018   HGB 14.3 04/15/2018   HCT 41.1 04/15/2018   MCV 90.9 04/15/2018    PLT 305.0 04/15/2018   Recent Labs    08/28/17 0452 01/05/18 1120 04/15/18 0830  NA 140 140 137  K 4.0 3.8 3.8  CL 104  --  100  CO2 27  --  26  GLUCOSE 136* 98 104*  BUN 16  --  21  CREATININE 0.71  --  0.83  CALCIUM 8.7*  --  9.5  GFRNONAA >60  --   --   GFRAA >60  --   --   PROT  --   --  7.2  ALBUMIN  --   --  4.4  AST  --   --  22  ALT  --   --  33  ALKPHOS  --   --  60  BILITOT  --   --  1.3*  BILIDIR  --   --  0.2    RADIOGRAPHIC STUDIES: I have personally reviewed the radiological images as listed and agreed with the findings in the report. No results found.  ASSESSMENT & PLAN:   Atypical ductal hyperplasia of left breast #Left breast atypical ductal hyperplasia-currently on antihormone therapy with Aromasin.  Tolerating well without any major side effects.  Will check with mammography regarding mammogram.  #Left nipple tenderness-no masses felt/no skin changes.  Question dryness of the nipple recommend moisturizing lotion.  Informed to let us know if her symptoms do not improve in the next 1 week or so.  # BMD march 2020- Osteopenia- T score= -1.9.  Recommend calcium plus vitamin D plus physical activity/exercise.  Patient agrees.  # DISPOSITION: # follow up in 6 months/ no labs;  Dr.B  All questions were answered. The patient knows to call the clinic with any problems, questions or concerns.    Cammie Sickle, MD 08/16/2018 1:44 PM

## 2018-08-22 ENCOUNTER — Encounter: Payer: Self-pay | Admitting: Internal Medicine

## 2018-08-23 ENCOUNTER — Other Ambulatory Visit: Payer: Self-pay

## 2018-08-23 MED ORDER — FLUOXETINE HCL 40 MG PO CAPS: 40.0000 mg | ORAL_CAPSULE | Freq: Every day | ORAL | 1 refills | Status: DC

## 2018-08-31 ENCOUNTER — Encounter: Payer: Self-pay | Admitting: Internal Medicine

## 2018-09-02 ENCOUNTER — Encounter: Payer: Self-pay | Admitting: Internal Medicine

## 2018-09-02 ENCOUNTER — Other Ambulatory Visit: Payer: Self-pay

## 2018-09-02 MED ORDER — FLUOXETINE HCL 40 MG PO CAPS: 40.0000 mg | ORAL_CAPSULE | Freq: Every day | ORAL | 1 refills | Status: DC

## 2018-09-11 ENCOUNTER — Telehealth: Payer: BC Managed Care – PPO | Admitting: Family

## 2018-09-11 DIAGNOSIS — R300 Dysuria: Secondary | ICD-10-CM | POA: Diagnosis not present

## 2018-09-11 MED ORDER — NITROFURANTOIN MONOHYD MACRO 100 MG PO CAPS: 100.0000 mg | ORAL_CAPSULE | Freq: Two times a day (BID) | ORAL | 0 refills | Status: DC

## 2018-09-11 NOTE — Progress Notes (Signed)

## 2018-09-28 ENCOUNTER — Encounter: Payer: Self-pay | Admitting: Internal Medicine

## 2018-10-23 ENCOUNTER — Other Ambulatory Visit: Payer: Self-pay | Admitting: Internal Medicine

## 2018-10-28 ENCOUNTER — Other Ambulatory Visit: Payer: Self-pay | Admitting: Internal Medicine

## 2018-10-28 ENCOUNTER — Telehealth: Payer: BC Managed Care – PPO | Admitting: Physician Assistant

## 2018-10-28 DIAGNOSIS — J029 Acute pharyngitis, unspecified: Secondary | ICD-10-CM

## 2018-10-28 MED ORDER — PENICILLIN V POTASSIUM 500 MG PO TABS: 500.0000 mg | ORAL_TABLET | Freq: Two times a day (BID) | ORAL | 0 refills | Status: DC

## 2018-10-28 NOTE — Progress Notes (Signed)
We are sorry that you are not feeling well.  Here is how we plan to help!  Based on what you have shared with me it is likely that you have strep pharyngitis.  Strep pharyngitis is inflammation and infection in the back of the throat.  This is an infection cause by bacteria and is treated with antibiotics.  I have prescribed Penicillin V 500 mg twice a day for 10 days. For throat pain, we recommend over the counter oral pain relief medications such as acetaminophen or aspirin, or anti-inflammatory medications such as ibuprofen or naproxen sodium. Topical treatments such as oral throat lozenges or sprays may be used as needed. Strep infections are not as easily transmitted as other respiratory infections, however we still recommend that you avoid close contact with loved ones, especially the very young and elderly.  Remember to wash your hands thoroughly throughout the day as this is the number one way to prevent the spread of infection and wipe down door knobs and counters with disinfectant.   Home Care:  Only take medications as instructed by your medical team.  Complete the entire course of an antibiotic.  Do not take these medications with alcohol.  A steam or ultrasonic humidifier can help congestion.  You can place a towel over your head and breathe in the steam from hot water coming from a faucet.  Avoid close contacts especially the very young and the elderly.  Cover your mouth when you cough or sneeze.  Always remember to wash your hands.  Get Help Right Away If:  You develop worsening fever or sinus pain.  You develop a severe head ache or visual changes.  Your symptoms persist after you have completed your treatment plan.  Make sure you  Understand these instructions.  Will watch your condition.  Will get help right away if you are not doing well or get worse.  Your e-visit answers were reviewed by a board certified advanced clinical practitioner to complete your  personal care plan.  Depending on the condition, your plan could have included both over the counter or prescription medications.  If there is a problem please reply  once you have received a response from your provider.  Your safety is important to [place].  If you have drug allergies check your prescription carefully.    You can use MyChart to ask questions about today's visit, request a non-urgent call back, or ask for a work or school excuse for 24 hours related to this e-Visit. If it has been greater than 24 hours you will need to follow up with your provider, or enter a new e-Visit to address those concerns.  You will get an e-mail in the next two days asking about your experience.  I hope that your e-visit has been valuable and will speed your recovery. Thank you for using e-visits.  Greater than 5 minutes, yet less than 10 minutes of time have been spent researching, coordinating, and implementing care for this patient today

## 2018-11-01 MED ORDER — ALPRAZOLAM 0.25 MG PO TABS: 0.2500 mg | ORAL_TABLET | Freq: Every day | ORAL | 0 refills | Status: DC | PRN

## 2018-11-02 ENCOUNTER — Other Ambulatory Visit: Payer: Self-pay

## 2018-11-04 ENCOUNTER — Encounter: Payer: Self-pay | Admitting: Internal Medicine

## 2018-11-04 ENCOUNTER — Ambulatory Visit: Payer: BC Managed Care – PPO | Admitting: Internal Medicine

## 2018-11-07 NOTE — Telephone Encounter (Signed)
Called and spoke with pt.  She is ok with rescheduling the appt.  I offered to see her before 11/25/18.  She states she is fine to wait until 11/25/18 for the appt.  Discussed concerns.  Questions answered.  Will call if feels needs something prior to 11/25/18.

## 2018-11-07 NOTE — Telephone Encounter (Signed)
Addressed by PCP

## 2018-11-09 ENCOUNTER — Other Ambulatory Visit: Payer: Self-pay | Admitting: *Deleted

## 2018-11-09 MED ORDER — EXEMESTANE 25 MG PO TABS: 25.0000 mg | ORAL_TABLET | Freq: Every day | ORAL | 1 refills | Status: DC

## 2018-11-23 ENCOUNTER — Other Ambulatory Visit: Payer: Self-pay

## 2018-11-25 ENCOUNTER — Other Ambulatory Visit: Payer: Self-pay

## 2018-11-25 ENCOUNTER — Ambulatory Visit (INDEPENDENT_AMBULATORY_CARE_PROVIDER_SITE_OTHER): Payer: BC Managed Care – PPO | Admitting: Internal Medicine

## 2018-11-25 VITALS — BP 122/70 | HR 64 | Temp 96.8°F | Resp 16 | Ht 68.0 in | Wt 206.0 lb

## 2018-11-25 DIAGNOSIS — R928 Other abnormal and inconclusive findings on diagnostic imaging of breast: Secondary | ICD-10-CM | POA: Diagnosis not present

## 2018-11-25 DIAGNOSIS — E7800 Pure hypercholesterolemia, unspecified: Secondary | ICD-10-CM

## 2018-11-25 DIAGNOSIS — Z01818 Encounter for other preprocedural examination: Secondary | ICD-10-CM

## 2018-11-25 DIAGNOSIS — Z8371 Family history of colonic polyps: Secondary | ICD-10-CM

## 2018-11-25 DIAGNOSIS — R945 Abnormal results of liver function studies: Secondary | ICD-10-CM | POA: Diagnosis not present

## 2018-11-25 DIAGNOSIS — Z0000 Encounter for general adult medical examination without abnormal findings: Secondary | ICD-10-CM

## 2018-11-25 DIAGNOSIS — N6092 Unspecified benign mammary dysplasia of left breast: Secondary | ICD-10-CM

## 2018-11-25 DIAGNOSIS — I10 Essential (primary) hypertension: Secondary | ICD-10-CM

## 2018-11-25 DIAGNOSIS — F419 Anxiety disorder, unspecified: Secondary | ICD-10-CM | POA: Diagnosis not present

## 2018-11-25 DIAGNOSIS — R7989 Other specified abnormal findings of blood chemistry: Secondary | ICD-10-CM

## 2018-11-25 NOTE — Assessment & Plan Note (Addendum)
Physical today 11/25/18.  S/p hysterectomy. Recent evaluation for abnormal mammogram as outlined.  Schedule f/u diagnostic mammogram.  Colonoscopy 03/2014.  Recommended f/u colonoscopy in 5 years.

## 2018-11-25 NOTE — Progress Notes (Signed)
Patient ID: Jordan Benson, female   DOB: September 11, 1955, 63 y.o.   MRN: [id number]   Subjective:    Patient ID: Jordan Benson, female    DOB: 12-28-1955, 63 y.o.   MRN: [id number]  HPI  Patient here for her physical exam.  She is planning for knee surgery 12/13/18.  Has had recent surgeries recently.  Has done well with previous surgeries.  Reports no chest pain.  No sob.  No acid reflux.  No abdominal pain.  Bowels moving.  Diagnosed with atypical ductal hyperplasia.  On aromasin.  Followed by oncology.  Handling stress.  Discussed with her.  She does not feel needs anything more at this time.  Due for colonoscopy 03/2019.  Discussed the need to decrease/stop alcohol intake.     Past Medical History:  Diagnosis Date  . Anxiety   . Breast cancer ([place]) 12/2017  . Complication of anesthesia   . GERD (gastroesophageal reflux disease)   . Headache    optical migraines  . Hypertension   . IBS (irritable bowel syndrome)   . Melanoma ([place]) 2019   followed by Dr Evorn Gong, on left arm  . PONV (postoperative nausea and vomiting)   . Vitamin D deficiency    Past Surgical History:  Procedure Laterality Date  . ABDOMINAL HYSTERECTOMY  1992  . BREAST BIOPSY Left 12/21/2017   affirm bx of asymmetry, ATYPICAL DUCTAL PAPILLARY LESION WITH ADJACENT ATYPICAL DUCTAL HYPERPLASIA.   Marland Kitchen BREAST BIOPSY Left 12/21/2017   [place] bx of LN, benign  . BREAST LUMPECTOMY Left 01/05/2018   ADH  . BREAST LUMPECTOMY WITH NEEDLE LOCALIZATION Left 01/05/2018   Procedure: BREAST LUMPECTOMY WITH NEEDLE LOCALIZATION;  Surgeon: Olean Ree, MD;  Location: [facility] ORS;  Service: General;  Laterality: Left;  . BREAST SURGERY    . JOINT REPLACEMENT Right 10/2017   TKR  . MOHS SURGERY  2012  . OVARIAN CYST REMOVAL  1991  . RE-EXCISION OF BREAST LUMPECTOMY Left 01/12/2018   Procedure: RE-EXCISION OF BREAST LUMPECTOMY;  Surgeon: Olean Ree, MD;  Location: [facility] ORS;  Service: General;  Laterality: Left;  . SEPTOPLASTY  2009   . TOTAL KNEE 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   Procedure: RIGHT TOTAL KNEE ARTHROPLASTY;  Surgeon: Sydnee Cabal, MD;  Location: [facility];  Service: Orthopedics;  Laterality: Right;  2 hrs  . TUBAL LIGATION  1985  . tummy tuck  1995  . WISDOM TOOTH EXTRACTION     Family History  Problem Relation Age of Onset  . Cancer Mother 8       breast cancer  . Heart disease Mother   . Hypertension Mother   . Diabetes Mother   . Breast cancer Mother 79  . Heart disease Father   . Breast cancer Sister 73       on HRT   Social History   Socioeconomic History  . Marital status: Married    Spouse name: Iona Beard  . Number of children: 2  . Years of education: Not on file  . Highest education level: Not on file  Occupational History    Employer: [employer]  . Financial resource strain: Not on file  . Food insecurity    Worry: Not on file    Inability: Not on file  . Transportation needs    Medical: Not on file    Non-medical: Not on file  Tobacco Use  . Smoking status: Former Smoker    Packs/day: 0.50    Years: 10.00  Pack years: 5.00    Types: Cigarettes    Quit date: 03/12/2000    Years since quitting: 18.7  . Smokeless tobacco: Never Used  Substance and Sexual Activity  . Alcohol use: Yes    Alcohol/week: 1.0 standard drinks    Types: 1 Standard drinks or equivalent per week    Comment: wine daily  . Drug use: No  . Sexual activity: Not on file  Lifestyle  . Physical activity    Days per week: Not on file    Minutes per session: Not on file  . Stress: Not on file  Relationships  . Social Herbalist on phone: Not on file    Gets together: Not on file    Attends religious service: Not on file    Active member of club or organization: Not on file    Attends meetings of clubs or organizations: Not on file    Relationship status: Not on file  Other Topics Concern  . Not on file  Social History Narrative  . Not on file    Outpatient Encounter  Medications as of 11/25/2018  Medication Sig  . ALPRAZolam (XANAX) 0.25 MG tablet Take 1 tablet (0.25 mg total) by mouth daily as needed. for anxiety  . amLODipine (NORVASC) 5 MG tablet TAKE 1 TABLET BY MOUTH DAILY( GENERIC EQUIVALENT FOR NORVASC)  . calcium elemental as carbonate (TUMS ULTRA 1000) 400 MG chewable tablet Chew 1,000 mg by mouth daily as needed for heartburn.   . enalapril (VASOTEC) 10 MG tablet TAKE 1 TABLET(10 MG) BY MOUTH TWICE DAILY (Patient taking differently: Take 10 mg by mouth daily. )  . exemestane (AROMASIN) 25 MG tablet Take 1 tablet (25 mg total) by mouth daily after breakfast.  . Multiple Vitamins-Calcium (ONE-A-DAY WOMENS PO) Take 1 tablet by mouth daily.  Marland Kitchen triamterene-hydrochlorothiazide (MAXZIDE-25) 37.5-25 MG tablet TAKE 1 TABLET BY MOUTH DAILY  . [DISCONTINUED] FLUoxetine (PROZAC) 40 MG capsule Take 1 capsule (40 mg total) by mouth daily.  . [DISCONTINUED] nitrofurantoin, macrocrystal-monohydrate, (MACROBID) 100 MG capsule Take 1 capsule (100 mg total) by mouth 2 (two) times daily.  . [DISCONTINUED] penicillin v potassium (VEETID) 500 MG tablet Take 1 tablet (500 mg total) by mouth 2 (two) times daily.   No facility-administered encounter medications on file as of 11/25/2018.    Review of Systems  Constitutional: Negative for appetite change and unexpected weight change.  HENT: Negative for congestion and sinus pressure.   Eyes: Negative for pain and visual disturbance.  Respiratory: Negative for cough, chest tightness and shortness of breath.   Cardiovascular: Negative for chest pain, palpitations and leg swelling.  Gastrointestinal: Negative for abdominal pain, diarrhea, nausea and vomiting.  Genitourinary: Negative for difficulty urinating and dysuria.  Musculoskeletal: Negative for joint swelling and myalgias.  Skin: Negative for color change and rash.  Neurological: Negative for dizziness, light-headedness and headaches.  Hematological: Negative for  adenopathy. Does not bruise/bleed easily.  Psychiatric/Behavioral: Negative for agitation and dysphoric mood.       Objective:    Physical Exam Constitutional:      General: She is not in acute distress.    Appearance: Normal appearance. She is well-developed.  HENT:     Head: Normocephalic and atraumatic.     Right Ear: External ear normal.     Left Ear: External ear normal.  Eyes:     General: No scleral icterus.       Right eye: No discharge.  Left eye: No discharge.     Conjunctiva/sclera: Conjunctivae normal.  Neck:     Musculoskeletal: Neck supple. No muscular tenderness.     Thyroid: No thyromegaly.  Cardiovascular:     Rate and Rhythm: Normal rate and regular rhythm.  Pulmonary:     Effort: No tachypnea, accessory muscle usage or respiratory distress.     Breath sounds: Normal breath sounds. No decreased breath sounds or wheezing.  Chest:     Breasts:        Right: No inverted nipple, mass, nipple discharge or tenderness (no axillary adenopathy).        Left: No inverted nipple, mass, nipple discharge or tenderness (no axilarry adenopathy).  Abdominal:     General: Bowel sounds are normal.     Palpations: Abdomen is soft.     Tenderness: There is no abdominal tenderness.  Musculoskeletal:        General: No swelling or tenderness.  Lymphadenopathy:     Cervical: No cervical adenopathy.  Skin:    Findings: No erythema or rash.  Neurological:     Mental Status: She is alert and oriented to person, place, and time.  Psychiatric:        Mood and Affect: Mood normal.        Behavior: Behavior normal.     BP 122/70   Pulse 64   Temp (!) 96.8 F (36 C)   Resp 16   Ht 5\' 8"  (1.727 m)   Wt 206 lb (93.4 kg)   SpO2 97%   BMI 31.32 kg/m  Wt Readings from Last 3 Encounters:  11/25/18 206 lb (93.4 kg)  08/16/18 207 lb 8 oz (94.1 kg)  07/12/18 208 lb (94.3 kg)     Lab Results  Component Value Date   WBC 5.5 04/15/2018   HGB 14.3 04/15/2018   HCT  41.1 04/15/2018   PLT 305.0 04/15/2018   GLUCOSE 104 (H) 04/15/2018   CHOL 228 (H) 04/15/2018   TRIG 184.0 (H) 04/15/2018   HDL 56.70 04/15/2018   LDLCALC 134 (H) 04/15/2018   ALT 33 04/15/2018   AST 22 04/15/2018   NA 137 04/15/2018   K 3.8 04/15/2018   CL 100 04/15/2018   CREATININE 0.83 04/15/2018   BUN 21 04/15/2018   CO2 26 04/15/2018   TSH 1.64 04/15/2018   INR 0.94 08/13/2017   HGBA1C 5.3 04/15/2018    Dg Bone Density  Result Date: 04/20/2018 EXAM: DUAL X-RAY ABSORPTIOMETRY (DXA) FOR BONE MINERAL DENSITY IMPRESSION: Technologist: MTB Your patient Jordan Benson completed a BMD test on 04/20/2018 using the [place] (analysis version: 14.10) manufactured by [employer]. The following summarizes the results of our evaluation. PATIENT BIOGRAPHICAL: Name: Jordan Benson, Jordan Benson Patient ID: [id number] Birth Date: 1955/06/05 Height: 68.0 in. Gender: Female Exam Date: 04/20/2018 Weight: 210.7 lbs. Indications: Caucasian, History of Fracture (Adult), Hysterectomy, Oophorectomy Bilateral, Osteoarthritis, Postmenopausal, Vitamin D Deficiency Fractures: Foot Treatments: Calcium, Multi-Vitamin ASSESSMENT: The BMD measured at Femur Neck Right is 0.773 g/cm2 with a T-score of -1.9. This patient is considered osteopenic according to [facility]) criteria. The quality of the scan is good. L-3 and L-4 were excluded due to degenerative changes. Site Region Measured Measured WHO Young Adult BMD Date       Age      Classification T-score AP Spine L1-L2 04/20/2018 64.2 Osteopenia -1.4 1.001 g/cm2 DualFemur Neck Right 04/20/2018 64.2 Osteopenia -1.9 0.773 g/cm2 DualFemur Total Mean 04/20/2018 64.2 Osteopenia -1.4 0.835 g/cm2  World Pharmacologist [facility]) criteria for post-menopausal, Caucasian Women: Normal:       T-score at or above -1 SD Osteopenia:   T-score between -1 and -2.5 SD Osteoporosis: T-score at or below -2.5 SD RECOMMENDATIONS: 1. All patients should optimize  calcium and vitamin D intake. 2. Consider FDA-approved medical therapies in postmenopausal women and men aged 4 years and older, based on the following: a. A hip or vertebral(clinical or morphometric) fracture b. T-score < -2.5 at the femoral neck or spine after appropriate evaluation to exclude secondary causes c. Low bone mass (T-score between -1.0 and -2.5 at the femoral neck or spine) and a 10-year probability of a hip fracture > 3% or a 10-year probability of a major osteoporosis-related fracture > 20% based on the [place]-adapted WHO algorithm d. Clinician judgment and/or patient preferences may indicate treatment for people with 10-year fracture probabilities above or below these levels FOLLOW-UP: People with diagnosed cases of osteoporosis or at high risk for fracture should have regular bone mineral density tests. For patients eligible for Medicare, routine testing is allowed once every 2 years. The testing frequency can be increased to one year for patients who have rapidly progressing disease, those who are receiving or discontinuing medical therapy to restore bone mass, or have additional risk factors. I have reviewed this report, and agree with the above findings. [facility] Dear Karen Kitchens, Your patient Jordan Benson completed a FRAX assessment on 04/20/2018 using the [place] (analysis version: 14.10) manufactured by [employer]. The following summarizes the results of our evaluation. PATIENT BIOGRAPHICAL: Name: Jordan Benson, Jordan Benson Patient ID: [id number] Birth Date: 03/16/1955 Height:    68.0 in. Gender:     Female    [age]       Weight:    210.7 lbs. Ethnicity:  White                            Exam Date: 04/20/2018 FRAX* RESULTS:  (version: 3.5) 10-year Probability of Fracture1 Major Osteoporotic Fracture2 Hip Fracture 16.1% 2.2% Population: [place] (Caucasian) Risk Factors: History of Fracture (Adult) Based on Femur (Right) Neck BMD 1 -The 10-year probability of  fracture may be lower than reported if the patient has received treatment. 2 -Major Osteoporotic Fracture: Clinical Spine, Forearm, Hip or Shoulder *FRAX is a Materials engineer of the [employer] for Metabolic Bone Disease, a [place] (WHO) [facility]. ASSESSMENT: The probability of a major osteoporotic fracture is 16.1% within the next ten years. The probability of a hip fracture is 2.2% within the next ten years. . Electronically Signed   By: Lowella Grip III M.D.   On: 04/20/2018 14:11       Assessment & Plan:   Problem List Items Addressed This Visit    Abnormal liver function test    Discussed decreasing alcohol intake.  Discussed diet and exercise.  Follow liver function tests.        Abnormal mammogram - Primary   Relevant Orders   MM DIAG BREAST TOMO BILATERAL   US BREAST LTD UNI LEFT INC AXILLA   US BREAST LTD UNI RIGHT INC AXILLA   Anxiety    On prozac.  Stable.       Atypical ductal hyperplasia of left breast    On aromasin.  Followed by oncology.  Schedule diagnostic mammogram.  Essential hypertension, benign    Blood pressure under good control.  Continue same medication regimen.  Follow pressures.  Follow metabolic panel.        Family history of colonic polyps    Colonoscopy 03/2014. Recommended f/u colonoscopy in 5 years.  Discussed with her today.       Health care maintenance    Physical today 11/25/18.  S/p hysterectomy. Recent evaluation for abnormal mammogram as outlined.  Schedule f/u diagnostic mammogram.  Colonoscopy 03/2014.  Recommended f/u colonoscopy in 5 years.        Hypercholesterolemia    Low cholesterol diet and exercise.  Follow lipid panel.       Pre-op evaluation    Recent surgeries and she did well.  No chest pain or sob with increased activity or exertion.  Given no symptoms and given recent surgeries with no complication, I do feel she is at low risk from a cardiac  standpoint to proceed with planned surgery. Will need close intra op and post op monitoring of heart rate and blood pressure to avoid extremes.              Einar Pheasant, MD

## 2018-11-29 ENCOUNTER — Encounter: Payer: Self-pay | Admitting: Internal Medicine

## 2018-11-30 ENCOUNTER — Encounter: Payer: Self-pay | Admitting: Internal Medicine

## 2018-11-30 ENCOUNTER — Other Ambulatory Visit: Payer: Self-pay

## 2018-11-30 MED ORDER — FLUOXETINE HCL 40 MG PO CAPS: 40.0000 mg | ORAL_CAPSULE | Freq: Every day | ORAL | 1 refills | Status: DC

## 2018-11-30 NOTE — Assessment & Plan Note (Signed)
On prozac.  Stable.   

## 2018-11-30 NOTE — Assessment & Plan Note (Signed)
Discussed decreasing alcohol intake.  Discussed diet and exercise.  Follow liver function tests.   

## 2018-11-30 NOTE — Assessment & Plan Note (Signed)
Blood pressure under good control.  Continue same medication regimen.  Follow pressures.  Follow metabolic panel.   

## 2018-11-30 NOTE — Assessment & Plan Note (Signed)
Colonoscopy 03/2014. Recommended f/u colonoscopy in 5 years.  Discussed with her today.

## 2018-11-30 NOTE — Assessment & Plan Note (Signed)
Recent surgeries and she did well.  No chest pain or sob with increased activity or exertion.  Given no symptoms and given recent surgeries with no complication, I do feel she is at low risk from a cardiac standpoint to proceed with planned surgery. Will need close intra op and post op monitoring of heart rate and blood pressure to avoid extremes.

## 2018-11-30 NOTE — Assessment & Plan Note (Signed)
Low cholesterol diet and exercise.  Follow lipid panel.   

## 2018-11-30 NOTE — Assessment & Plan Note (Signed)
On aromasin.  Followed by oncology.  Schedule diagnostic mammogram.

## 2018-12-12 ENCOUNTER — Other Ambulatory Visit: Payer: Self-pay | Admitting: Internal Medicine

## 2018-12-12 ENCOUNTER — Encounter: Payer: Self-pay | Admitting: Internal Medicine

## 2018-12-13 ENCOUNTER — Other Ambulatory Visit: Payer: Self-pay | Admitting: Internal Medicine

## 2018-12-13 DIAGNOSIS — Z1231 Encounter for screening mammogram for malignant neoplasm of breast: Secondary | ICD-10-CM

## 2018-12-13 NOTE — Telephone Encounter (Signed)
Last OV 11/25/18 last refill 11/01/18

## 2018-12-14 MED ORDER — ALPRAZOLAM 0.25 MG PO TABS: 0.2500 mg | ORAL_TABLET | Freq: Every day | ORAL | 0 refills | Status: DC | PRN

## 2018-12-27 ENCOUNTER — Other Ambulatory Visit: Payer: Self-pay

## 2018-12-27 ENCOUNTER — Ambulatory Visit
Admission: RE | Admit: 2018-12-27 | Discharge: 2018-12-27 | Disposition: A | Discharge status: Home / Self Care | Payer: Medicare Other | Source: Ambulatory Visit | Attending: Internal Medicine | Admitting: Internal Medicine

## 2018-12-27 DIAGNOSIS — Z1231 Encounter for screening mammogram for malignant neoplasm of breast: Secondary | ICD-10-CM | POA: Insufficient documentation

## 2018-12-28 ENCOUNTER — Encounter: Payer: Self-pay | Admitting: Internal Medicine

## 2019-01-06 ENCOUNTER — Other Ambulatory Visit: Payer: Self-pay

## 2019-01-06 ENCOUNTER — Encounter: Payer: Self-pay | Admitting: Emergency Medicine

## 2019-01-06 ENCOUNTER — Ambulatory Visit: Payer: Self-pay

## 2019-01-06 ENCOUNTER — Ambulatory Visit: Payer: Self-pay | Admitting: *Deleted

## 2019-01-06 ENCOUNTER — Ambulatory Visit
Admission: EM | Admit: 2019-01-06 | Discharge: 2019-01-06 | Disposition: A | Discharge status: Home / Self Care | Payer: Medicare Other

## 2019-01-06 ENCOUNTER — Encounter: Payer: Self-pay | Admitting: Internal Medicine

## 2019-01-06 DIAGNOSIS — U071 COVID-19: Secondary | ICD-10-CM | POA: Diagnosis not present

## 2019-01-06 DIAGNOSIS — F410 Panic disorder [episodic paroxysmal anxiety] without agoraphobia: Secondary | ICD-10-CM | POA: Diagnosis not present

## 2019-01-06 NOTE — Telephone Encounter (Signed)
Patient triaged by another [facility] nurse.

## 2019-01-06 NOTE — Telephone Encounter (Signed)
Patient is calling to ask her blood oxygen levels are varying between 82-88 is this ok? Patient just had Knee Surgery 12/28/18 and recently got COVID results 01/05/19. Patient feels fine.   Attempted to call patient- left message to call back

## 2019-01-06 NOTE — Telephone Encounter (Signed)
Incoming call from Patient  Jordan Benson[prting that O2 sats are from 80 to 90 Reports having knee surgery  ealier in the week  Then reports a slight cold   Reports fever, SOB mild fever. Reviewed protocol with Patient .  Recommended Patient go to nearest Urgent Care or Ed.   Patient voiced understanding.  Husband can drive her.    Voiced understanding. Patient called the Urgent care to inform them that she was covid positive.             Reason for Disposition . [1] MODERATE difficulty breathing (e.g., speaks in phrases, SOB even at rest, pulse 100-120) AND [2] NEW-onset or WORSE than normal  Answer Assessment - Initial Assessment Questions 1. RESPIRATORY STATUS: "Describe your breathing?" (e.g., wheezing, shortness of breath, unable to speak, severe coughing)   exertion  2. ONSET: "When did this breathing problem begin?"      today 3. PATTERN "Does the difficult breathing come and go, or has it been constant since it started?"     Come and goes 4. SEVERITY: "How bad is your breathing?" (e.g., mild, moderate, severe)    - MILD: No SOB at rest, mild SOB with walking, speaks normally in sentences, can lay down, no retractions, pulse < 100.    - MODERATE: SOB at rest, SOB with minimal exertion and prefers to sit, cannot lie down flat, speaks in phrases, mild retractions, audible wheezing, pulse 100-120.    - SEVERE: Very SOB at rest, speaks in single words, struggling to breathe, sitting hunched forward, retractions, pulse > 120    mild 5. RECURRENT SYMPTOM: "Have you had difficulty breathing before?" If so, ask: "When was the last time?" and "What happened that time?"      *No Answer* 6. CARDIAC HISTORY: "Do you have any history of heart disease?" (e.g., heart attack, angina, bypass surgery, angioplasty)     Denies  7. LUNG HISTORY: "Do you have any history of lung disease?"  (e.g., pulmonary embolus, asthma, emphysema)     denies 8. CAUSE: "What do you think is causing the breathing problem?"       *No Answer* 9. OTHER SYMPTOMS: "Do you have any other symptoms? (e.g., dizziness, runny nose, cough, chest pain, fever)     Had congestion  10. PREGNANCY: "Is there any chance you are pregnant?" "When was your last menstrual period?"       TRAVEL: "Have you traveled out of the country in the last month?" (e.g., travel history, exposures)       denies  Protocols used: BREATHING DIFFICULTY-A-AH

## 2019-01-06 NOTE — ED Triage Notes (Signed)
Patient in today c/o sob x today. Patient is covid positive. Patient was tested on Monday and received results yesterday 01/05/19. Patient had right knee replacement revision on 12/28/18.

## 2019-01-06 NOTE — ED Provider Notes (Signed)
[facility]    CSN: [id number] Arrival date & time: 01/06/19  1640      History   Chief Complaint Chief Complaint  Patient presents with  . covid positive  . Shortness of Breath    HPI Jordan Benson is a 63 y.o. female.   Subjective:   Jordan Benson is a 63 y.o. female who presents for evaluation for shortness of breath. Symptoms occur with exertion only. Symptoms began this morning and has completely resolved since. Patient had URI type symptoms about a week ago and was subsequently tested for COVID. She was informed on yesterday that her COVID test was positive. Patient reports that her original symptoms have improved. This morning, patient had a single episode of shortness of breath while walking around and got very anxious/nervous. She checked her O2 saturations and the monitor read that her sats was in the 80s.  She got even more nervous and called a family member who advised her to go get it rechecked.  In the clinic, patient's O2 saturations were 98% on room air.  Patient is tired but otherwise feels a lot better. She denies cough, tightness in chest, wheezing or palpitations. She has not had recent travel. Weight has been stable. Symptoms are exacerbated by emotional stress. Symptoms are alleviated by rest.   Previous Report Reviewed: historical medical records   The following portions of the patient's history were reviewed and updated as appropriate: allergies, current medications, past family history, past medical history, past social history, past surgical history and problem list.       Past Medical History:  Diagnosis Date  . Anxiety   . Breast cancer ([place]) 12/2017  . Complication of anesthesia   . GERD (gastroesophageal reflux disease)   . Headache    optical migraines  . Hypertension   . IBS (irritable bowel syndrome)   . Melanoma ([place]) 2019   followed by Dr Evorn Gong, on left arm  . PONV (postoperative nausea and vomiting)   . Vitamin D  deficiency     Patient Active Problem List   Diagnosis Date Noted  . Atypical ductal hyperplasia of left breast   . Abnormal mammogram 12/12/2017  . 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 Osteoarthritis of right knee 08/27/2017  . Pre-op evaluation 03/05/2017  . Right knee pain 05/15/2016  . Hyperbilirubinemia 03/10/2015  . Palpitations 10/22/2014  . Abnormal liver function test 10/22/2014  . Health care maintenance 04/05/2014  . Family history of colonic polyps 09/29/2012  . Essential hypertension, benign 06/09/2012  . IBS (irritable bowel syndrome) 06/09/2012  . GERD (gastroesophageal reflux disease) 06/09/2012  . Anxiety 06/09/2012  . Vitamin D deficiency 06/09/2012  . Melanoma ([place]) 06/09/2012  . Hypercholesterolemia 06/09/2012    Past Surgical History:  Procedure Laterality Date  . ABDOMINAL HYSTERECTOMY  1992  . BREAST BIOPSY Left 12/21/2017   affirm bx of asymmetry, ATYPICAL DUCTAL PAPILLARY LESION WITH ADJACENT ATYPICAL DUCTAL HYPERPLASIA.   Marland Kitchen BREAST BIOPSY Left 12/21/2017   [place] bx of LN, benign  . BREAST LUMPECTOMY Left 01/05/2018   ADH  . BREAST LUMPECTOMY Left 01/12/2018   neg  . BREAST LUMPECTOMY WITH NEEDLE LOCALIZATION Left 01/05/2018   Procedure: BREAST LUMPECTOMY WITH NEEDLE LOCALIZATION;  Surgeon: Olean Ree, MD;  Location: [facility] ORS;  Service: General;  Laterality: Left;  . BREAST SURGERY    . JOINT REPLACEMENT Right 10/2017   TKR  . MOHS SURGERY  2012  . OVARIAN CYST REMOVAL  1991  . RE-EXCISION  OF BREAST LUMPECTOMY Left 01/12/2018   Procedure: RE-EXCISION OF BREAST LUMPECTOMY;  Surgeon: Olean Ree, MD;  Location: [facility] ORS;  Service: General;  Laterality: Left;  . SEPTOPLASTY  2009  . TOTAL KNEE 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   Procedure: RIGHT TOTAL KNEE ARTHROPLASTY;  Surgeon: Sydnee Cabal, MD;  Location: [facility];  Service: Orthopedics;  Laterality: Right;  2 hrs  . TUBAL LIGATION  1985  . tummy tuck  1995  . WISDOM TOOTH EXTRACTION      OB  History   No obstetric history on file.      Home Medications    Prior to Admission medications   Medication Sig Start Date End Date Taking? Authorizing Provider  ALPRAZolam (XANAX) 0.25 MG tablet Take 1 tablet (0.25 mg total) by mouth daily as needed. for anxiety 12/14/18  Yes Einar Pheasant, MD  amLODipine (NORVASC) 5 MG tablet TAKE 1 TABLET BY MOUTH DAILY( GENERIC EQUIVALENT FOR [place]) 08/08/18  Yes Einar Pheasant, MD  calcium elemental as carbonate (TUMS ULTRA 1000) 400 MG chewable tablet Chew 1,000 mg by mouth daily as needed for heartburn.    Yes [provider]  enalapril (VASOTEC) 10 MG tablet TAKE 1 TABLET(10 MG) BY MOUTH TWICE DAILY Patient taking differently: Take 10 mg by mouth daily.  11/15/17  Yes Einar Pheasant, MD  exemestane (AROMASIN) 25 MG tablet Take 1 tablet (25 mg total) by mouth daily after breakfast. 11/09/18  Yes Cammie Sickle, MD  FLUoxetine (PROZAC) 40 MG capsule Take 1 capsule (40 mg total) by mouth daily. 11/30/18  Yes Einar Pheasant, MD  Multiple Vitamins-Calcium (ONE-A-DAY WOMENS PO) Take 1 tablet by mouth daily.   Yes [provider]  triamterene-hydrochlorothiazide (MAXZIDE-25) 37.5-25 MG tablet TAKE 1 TABLET BY MOUTH DAILY 10/26/18  Yes Einar Pheasant, MD    Family History Family History  Problem Relation Age of Onset  . Cancer Mother 56       breast cancer  . Heart disease Mother   . Hypertension Mother   . Diabetes Mother   . Breast cancer Mother 22  . Heart disease Father   . Breast cancer Sister 62       on HRT    Social History Social History   Tobacco Use  . Smoking status: Former Smoker    Packs/day: 0.50    Years: 10.00    Pack years: 5.00    Types: Cigarettes    Quit date: 03/12/2000    Years since quitting: 18.8  . Smokeless tobacco: Never Used  Substance Use Topics  . Alcohol use: Yes    Alcohol/week: 14.0 standard drinks    Types: 14 Glasses of wine per week    Comment: wine daily  . Drug use:  No     Allergies   Patient has no known allergies.   Review of Systems Review of Systems  Constitutional: Negative for fever.  Respiratory: Positive for shortness of breath. Negative for cough and wheezing.   Cardiovascular: Negative for chest pain and palpitations.  Psychiatric/Behavioral: The patient is nervous/anxious.   All other systems reviewed and are negative.    Physical Exam Triage Vital Signs ED Triage Vitals  Enc Vitals Group     BP 01/06/19 1703 137/76     Pulse Rate 01/06/19 1703 95     Resp 01/06/19 1703 18     Temp 01/06/19 1703 98.3 F (36.8 C)     Temp Source 01/06/19 1703 Oral     SpO2 01/06/19 1703 98 %  Weight 01/06/19 1704 201 lb (91.2 kg)     Height 01/06/19 1704 5\' 8"  (1.727 m)     Head Circumference --      Peak Flow --      Pain Score 01/06/19 1703 5     Pain Loc --      Pain Edu? --      Excl. in [place]? --    No data found.  Updated Vital Signs BP 137/76 (BP Location: Right Arm)   Pulse 95   Temp 98.3 F (36.8 C) (Oral)   Resp 18   Ht 5\' 8"  (1.727 m)   Wt 201 lb (91.2 kg)   SpO2 98%   BMI 30.56 kg/m   Visual Acuity Right Eye Distance:   Left Eye Distance:   Bilateral Distance:    Right Eye Near:   Left Eye Near:    Bilateral Near:     Physical Exam Vitals signs reviewed.  Constitutional:      Appearance: She is well-developed.  HENT:     Head: Normocephalic.  Neck:     Musculoskeletal: Normal range of motion and neck supple.  Cardiovascular:     Rate and Rhythm: Normal rate and regular rhythm.  Pulmonary:     Effort: Pulmonary effort is normal. No respiratory distress.     Breath sounds: Normal breath sounds.  Musculoskeletal: Normal range of motion.  Lymphadenopathy:     Cervical: No cervical adenopathy.  Skin:    General: Skin is warm and dry.  Neurological:     General: No focal deficit present.     Mental Status: She is alert and oriented to person, place, and time.  Psychiatric:        Mood and Affect:  Mood is anxious.      UC Treatments / Results  Labs (all labs ordered are listed, but only abnormal results are displayed) Labs Reviewed - No data to display  EKG   Radiology No results found.  Procedures Procedures (including critical care time)  Medications Ordered in UC Medications - No data to display  Initial Impression / Assessment and Plan / UC Course  I have reviewed the triage vital signs and the nursing notes.  Pertinent labs & imaging results that were available during my care of the patient were reviewed by me and considered in my medical decision making (see chart for details).    64 yo female who recently found out that she is COVID positive presents with a single episode of shortness of breath and low oxygen saturations. Symptoms resolved PTA. Patient's oxygen saturations here in the clinic is 98% on room air. Patient has history of anxiety and reports that she "got scared" because of COVID and her symptoms. She hasn't had any further episodes. No history of lung disease. Provided reassurance and discussed indications for immediate follow-up.    Today's evaluation has revealed no signs of a dangerous process. Discussed diagnosis with patient and/or guardian. Patient and/or guardian aware of their diagnosis, possible red flag symptoms to watch out for and need for close follow up. Patient and/or guardian understands verbal and written discharge instructions. Patient and/or guardian comfortable with plan and disposition.  Patient and/or guardian has a clear mental status at this time, good insight into illness (after discussion and teaching) and has clear judgment to make decisions regarding their care  This care was provided during an unprecedented National Emergency due to the Novel Coronavirus (COVID-19) pandemic. COVID-19 infections and transmission risks place heavy  strains on healthcare resources.  As this pandemic evolves, our facility, providers, and staff strive  to respond fluidly, to remain operational, and to provide care relative to available resources and information. Outcomes are unpredictable and treatments are without well-defined guidelines. Further, the impact of COVID-19 on all aspects of urgent care, including the impact to patients seeking care for reasons other than COVID-19, is unavoidable during this national emergency. At this time of the global pandemic, management of patients has significantly changed, even for non-COVID positive patients given high local and regional COVID volumes at this time requiring high healthcare system and resource utilization. The standard of care for management of both COVID suspected and non-COVID suspected patients continues to change rapidly at the local, regional, national, and global levels. This patient was worked up and treated to the best available but ever changing evidence and resources available at this current time.   Documentation was completed with the aid of voice recognition software. Transcription may contain typographical errors.   Final Clinical Impressions(s) / UC Diagnoses   Final diagnoses:  Anxiety attack  Clinical diagnosis of COVID-19   Discharge Instructions   None    ED Prescriptions    None     PDMP not reviewed this encounter.   Enrique Sack, [place] 01/06/19 1836

## 2019-01-08 ENCOUNTER — Encounter: Payer: Self-pay | Admitting: Internal Medicine

## 2019-01-09 NOTE — Telephone Encounter (Signed)
Called and spoke to pt.  Pt said that she is no longer having symptoms and was told from the Health Department that she was released from quarantine as of yesterday.  Pt said that she began having symptoms on 11/18, had a COVID test on 11/23 and tested positive on 11/26.  Pt said that she had an anxiety attack on last Friday and went to urgent care but is feeling better today and denies that she has had any anxiety attacks since.  Pt said that her husband has tested positive for COVID and is having symptoms, fever, cough.  Pt said that she is unable to quarantine in a different part of the house from her husband since she is taking care of him.  Not sure if the Health Dept was aware that spouse also tested positive for COVID and is having active symptoms.  Advise pt to continue quarantining until receiving advisement from Dr. Nicki Reaper.

## 2019-01-09 NOTE — Telephone Encounter (Signed)
Patient says he is feeling much better said she went to UC and she had a panic attack, but she is feeling much better but her husband just tested COVID positive also  They are both oh just wanted MD aware , and they are aware of COVID quarantine protocol.

## 2019-01-09 NOTE — Telephone Encounter (Signed)
See last note patient feeling better.

## 2019-01-09 NOTE — Telephone Encounter (Signed)
Reviewed notes.  Need to know how her oxygen level is doing now.  Per her report - was 80-90%.  Can schedule doxy with me.

## 2019-01-09 NOTE — Telephone Encounter (Signed)
Called and spoke to pt.  Pt said that she thinks she had a faulty pulse oximeter since her O2 was fine when she went to Urgent Care on last Friday.  Pt denies having any SOB or difficulty breathing, no blue or discoloration around fingernails/fingertips, toenails/toes.  Offered pt a virtual appt for her or her husband.  Pt said that neither her nor her husband need to schedule a virtual visit for now.  Pt said that she talked to the hospital in [place] and called EMS last night regarding her husband when he had a fever and said that her husband seems to be doing fine today.  Pt will call back if one is needed.

## 2019-01-09 NOTE — Telephone Encounter (Signed)
Left message for patient to return call to office. 

## 2019-01-09 NOTE — Telephone Encounter (Signed)
Copied from [place] [id number]. Topic: General - Other >> Jan 09, 2019  3:38 PM Yvette Rack wrote: Reason for CRM: Pt returned Savannaha Stonerock's call. Pt requests call back. Cb# [phone]

## 2019-01-09 NOTE — Telephone Encounter (Signed)
Need to confirm her oxygen is better.  Would recommend continuing to quarantine for now - given husband is positive.  If she feel she or her husband needs appt please schedule doxy appt to discuss.

## 2019-01-21 ENCOUNTER — Encounter: Payer: Self-pay | Admitting: Internal Medicine

## 2019-01-27 ENCOUNTER — Encounter: Payer: Self-pay | Admitting: Internal Medicine

## 2019-01-29 ENCOUNTER — Other Ambulatory Visit: Payer: Self-pay | Admitting: Internal Medicine

## 2019-01-30 MED ORDER — ALPRAZOLAM 0.25 MG PO TABS: 0.2500 mg | ORAL_TABLET | Freq: Every day | ORAL | 0 refills | Status: DC | PRN

## 2019-01-30 NOTE — Telephone Encounter (Signed)
rx sent in for xanax #30 with no refills.   

## 2019-02-14 ENCOUNTER — Other Ambulatory Visit: Payer: Self-pay

## 2019-02-14 ENCOUNTER — Inpatient Hospital Stay: Payer: Medicare PPO | Attending: Internal Medicine | Admitting: Internal Medicine

## 2019-02-14 DIAGNOSIS — I10 Essential (primary) hypertension: Secondary | ICD-10-CM | POA: Diagnosis not present

## 2019-02-14 DIAGNOSIS — Z8616 Personal history of COVID-19: Secondary | ICD-10-CM | POA: Diagnosis not present

## 2019-02-14 DIAGNOSIS — Z87891 Personal history of nicotine dependence: Secondary | ICD-10-CM | POA: Diagnosis not present

## 2019-02-14 DIAGNOSIS — K219 Gastro-esophageal reflux disease without esophagitis: Secondary | ICD-10-CM | POA: Insufficient documentation

## 2019-02-14 DIAGNOSIS — M8580 Other specified disorders of bone density and structure, unspecified site: Secondary | ICD-10-CM | POA: Insufficient documentation

## 2019-02-14 DIAGNOSIS — N6092 Unspecified benign mammary dysplasia of left breast: Secondary | ICD-10-CM | POA: Diagnosis not present

## 2019-02-14 DIAGNOSIS — F419 Anxiety disorder, unspecified: Secondary | ICD-10-CM | POA: Diagnosis not present

## 2019-02-14 DIAGNOSIS — E559 Vitamin D deficiency, unspecified: Secondary | ICD-10-CM | POA: Diagnosis not present

## 2019-02-14 DIAGNOSIS — Z79899 Other long term (current) drug therapy: Secondary | ICD-10-CM | POA: Insufficient documentation

## 2019-02-14 DIAGNOSIS — K589 Irritable bowel syndrome without diarrhea: Secondary | ICD-10-CM | POA: Insufficient documentation

## 2019-02-14 DIAGNOSIS — Z803 Family history of malignant neoplasm of breast: Secondary | ICD-10-CM | POA: Diagnosis not present

## 2019-02-14 DIAGNOSIS — Z79811 Long term (current) use of aromatase inhibitors: Secondary | ICD-10-CM | POA: Diagnosis not present

## 2019-02-14 DIAGNOSIS — Z85820 Personal history of malignant melanoma of skin: Secondary | ICD-10-CM | POA: Diagnosis not present

## 2019-02-14 MED ORDER — ANASTROZOLE 1 MG PO TABS: 1.0000 mg | ORAL_TABLET | Freq: Every day | ORAL | 4 refills | Status: DC

## 2019-02-14 NOTE — Assessment & Plan Note (Addendum)
#  Left breast atypical ductal hyperplasia-currently on antihormone therapy with Aromasin. Will switch to armidex 1mg /day [sec to cost issues]. Nov 2020- Mammo-WNL.   Tolerating well without any major side effects.   # BMD march 2020- Osteopenia- T score= -1.9.  Recommend calcium plus vitamin D plus physical activity/exercise.  Patient agrees.  # DISPOSITION: # follow up in 12  months/ no labs;  Dr.B

## 2019-02-14 NOTE — Progress Notes (Signed)
[place] NOTE  Patient Care Team: Einar Pheasant, MD as PCP - General (Internal Medicine)  CHIEF COMPLAINTS/PURPOSE OF CONSULTATION: Atypical ductal hyperplasia  # LEFT BREAST ATYPICAL DUCTAL HYPERPLASIA- June 2020 started Aromasin [declined tamoxifen because sister's intolerance or side effects]; January 2021-Arimidex [discussed]  #  Oncology History   No history exists.     HISTORY OF PRESENTING ILLNESS:  Jordan Benson 64 y.o.  female with  left breast atypical ductal hyperplasia-currently on Aromasin is here for follow-up.  Patient unfortunately lost her husband to Covid infection approximately 3 weeks ago.  Patient also had Covid in [place] recovered without any major complications.  No joint pain.  No nausea no vomiting.  No headaches.  Review of Systems  Constitutional: Negative for chills, diaphoresis, fever, malaise/fatigue and weight loss.  HENT: Negative for nosebleeds and sore throat.   Eyes: Negative for double vision.  Respiratory: Negative for cough, hemoptysis, sputum production, shortness of breath and wheezing.   Cardiovascular: Negative for chest pain, palpitations, orthopnea and leg swelling.  Gastrointestinal: Negative for abdominal pain, blood in stool, constipation, diarrhea, heartburn, melena, nausea and vomiting.  Genitourinary: Negative for dysuria, frequency and urgency.  Musculoskeletal: Negative for back pain and joint pain.  Skin: Negative.  Negative for itching and rash.  Neurological: Negative for dizziness, tingling, focal weakness, weakness and headaches.  Endo/Heme/Allergies: Does not bruise/bleed easily.  Psychiatric/Behavioral: Negative for depression. The patient is not nervous/anxious and does not have insomnia.      MEDICAL HISTORY:  Past Medical History:  Diagnosis Date  . Anxiety   . Breast cancer ([place]) 12/2017  . Complication of anesthesia   . GERD (gastroesophageal reflux disease)   . Headache     optical migraines  . Hypertension   . IBS (irritable bowel syndrome)   . Melanoma ([place]) 2019   followed by Dr Evorn Gong, on left arm  . PONV (postoperative nausea and vomiting)   . Vitamin D deficiency     SURGICAL HISTORY: Past Surgical History:  Procedure Laterality Date  . ABDOMINAL HYSTERECTOMY  1992  . BREAST BIOPSY Left 12/21/2017   affirm bx of asymmetry, ATYPICAL DUCTAL PAPILLARY LESION WITH ADJACENT ATYPICAL DUCTAL HYPERPLASIA.   Marland Kitchen BREAST BIOPSY Left 12/21/2017   [place] bx of LN, benign  . BREAST LUMPECTOMY Left 01/05/2018   ADH  . BREAST LUMPECTOMY Left 01/12/2018   neg  . BREAST LUMPECTOMY WITH NEEDLE LOCALIZATION Left 01/05/2018   Procedure: BREAST LUMPECTOMY WITH NEEDLE LOCALIZATION;  Surgeon: Olean Ree, MD;  Location: [facility] ORS;  Service: General;  Laterality: Left;  . BREAST SURGERY    . JOINT REPLACEMENT Right 10/2017   TKR  . MOHS SURGERY  2012  . OVARIAN CYST REMOVAL  1991  . RE-EXCISION OF BREAST LUMPECTOMY Left 01/12/2018   Procedure: RE-EXCISION OF BREAST LUMPECTOMY;  Surgeon: Olean Ree, MD;  Location: [facility] ORS;  Service: General;  Laterality: Left;  . SEPTOPLASTY  2009  . TOTAL KNEE 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   Procedure: RIGHT TOTAL KNEE ARTHROPLASTY;  Surgeon: Sydnee Cabal, MD;  Location: [facility];  Service: Orthopedics;  Laterality: Right;  2 hrs  . TUBAL LIGATION  1985  . tummy tuck  1995  . WISDOM TOOTH EXTRACTION      SOCIAL HISTORY: Social History   Socioeconomic History  . Marital status: Widowed    Spouse name: Iona Beard  . Number of children: 2  . Years of education: Not on file  . Highest education level: Not on file  Occupational History    Employer: [employer]  Tobacco Use  . Smoking status: Former Smoker    Packs/day: 0.50    Years: 10.00    Pack years: 5.00    Types: Cigarettes    Quit date: 03/12/2000    Years since quitting: 18.9  . Smokeless tobacco: Never Used  Substance and Sexual Activity  . Alcohol use: Yes     Alcohol/week: 14.0 standard drinks    Types: 14 Glasses of wine per week    Comment: wine daily  . Drug use: No  . Sexual activity: Not Currently  Other Topics Concern  . Not on file  Social History Narrative  . Not on file   Social Determinants of Health   Financial Resource Strain:   . Difficulty of Paying Living Expenses: Not on file  Food Insecurity:   . Worried About Charity fundraiser in the Last Year: Not on file  . Ran Out of Food in the Last Year: Not on file  Transportation Needs:   . Lack of Transportation (Medical): Not on file  . Lack of Transportation (Non-Medical): Not on file  Physical Activity:   . Days of Exercise per Week: Not on file  . Minutes of Exercise per Session: Not on file  Stress:   . Feeling of Stress : Not on file  Social Connections:   . Frequency of Communication with Friends and Family: Not on file  . Frequency of Social Gatherings with Friends and Family: Not on file  . Attends Religious Services: Not on file  . Active Member of Clubs or Organizations: Not on file  . Attends Archivist Meetings: Not on file  . Marital Status: Not on file  Intimate Partner Violence:   . Fear of Current or Ex-Partner: Not on file  . Emotionally Abused: Not on file  . Physically Abused: Not on file  . Sexually Abused: Not on file    FAMILY HISTORY: Family History  Problem Relation Age of Onset  . Cancer Mother 36       breast cancer  . Heart disease Mother   . Hypertension Mother   . Diabetes Mother   . Breast cancer Mother 16  . Heart disease Father   . Breast cancer Sister 9       on HRT    ALLERGIES:  has No Known Allergies.  MEDICATIONS:  Current Outpatient Medications  Medication Sig Dispense Refill  . ALPRAZolam (XANAX) 0.25 MG tablet Take 1 tablet (0.25 mg total) by mouth daily as needed. for anxiety 30 tablet 0  . amLODipine (NORVASC) 5 MG tablet TAKE 1 TABLET BY MOUTH DAILY( GENERIC EQUIVALENT FOR NORVASC) 90 tablet 1  .  calcium elemental as carbonate (TUMS ULTRA 1000) 400 MG chewable tablet Chew 1,000 mg by mouth daily as needed for heartburn.     . enalapril (VASOTEC) 10 MG tablet TAKE 1 TABLET(10 MG) BY MOUTH TWICE DAILY (Patient taking differently: Take 10 mg by mouth daily. ) 180 tablet 2  . FLUoxetine (PROZAC) 40 MG capsule Take 1 capsule (40 mg total) by mouth daily. 90 capsule 1  . Multiple Vitamins-Calcium (ONE-A-DAY WOMENS PO) Take 1 tablet by mouth daily.    Marland Kitchen triamterene-hydrochlorothiazide (MAXZIDE-25) 37.5-25 MG tablet TAKE 1 TABLET BY MOUTH DAILY 90 tablet 1  . anastrozole (ARIMIDEX) 1 MG tablet Take 1 tablet (1 mg total) by mouth daily. 90 tablet 4   No current facility-administered medications for this visit.      Marland Kitchen  PHYSICAL EXAMINATION: ECOG PERFORMANCE STATUS: 0 - Asymptomatic  Vitals:   02/14/19 1320  BP: 125/84  Pulse: 93  Temp: (!) 96.6 F (35.9 C)   Filed Weights   02/14/19 1320  Weight: 206 lb (93.4 kg)    Physical Exam  Constitutional: She is oriented to person, place, and time and well-developed, well-nourished, and in no distress.  HENT:  Head: Normocephalic and atraumatic.  Mouth/Throat: Oropharynx is clear and moist. No oropharyngeal exudate.  Eyes: Pupils are equal, round, and reactive to light.  Cardiovascular: Normal rate and regular rhythm.  Pulmonary/Chest: Effort normal and breath sounds normal. No respiratory distress. She has no wheezes.  Abdominal: Soft. Bowel sounds are normal. She exhibits no distension and no mass. There is no abdominal tenderness. There is no rebound and no guarding.  Musculoskeletal:        General: No tenderness or edema. Normal range of motion.     Cervical back: Normal range of motion and neck supple.  Neurological: She is alert and oriented to person, place, and time.  Skin: Skin is warm.  Psychiatric: Affect normal.      LABORATORY DATA:  I have reviewed the data as listed Lab Results  Component Value Date   WBC 5.5  04/15/2018   HGB 14.3 04/15/2018   HCT 41.1 04/15/2018   MCV 90.9 04/15/2018   PLT 305.0 04/15/2018   Recent Labs    04/15/18 0830  NA 137  K 3.8  CL 100  CO2 26  GLUCOSE 104*  BUN 21  CREATININE 0.83  CALCIUM 9.5  PROT 7.2  ALBUMIN 4.4  AST 22  ALT 33  ALKPHOS 60  BILITOT 1.3*  BILIDIR 0.2    RADIOGRAPHIC STUDIES: I have personally reviewed the radiological images as listed and agreed with the findings in the report. No results found.  ASSESSMENT & PLAN:   Atypical ductal hyperplasia of left breast #Left breast atypical ductal hyperplasia-currently on antihormone therapy with Aromasin. Will switch to armidex 1mg /day [sec to cost issues]. Nov 2020- Mammo-WNL.   Tolerating well without any major side effects.   # BMD march 2020- Osteopenia- T score= -1.9.  Recommend calcium plus vitamin D plus physical activity/exercise.  Patient agrees.  # DISPOSITION: # follow up in 12  months/ no labs;  Dr.B  All questions were answered. The patient knows to call the clinic with any problems, questions or concerns.    Cammie Sickle, MD 02/14/2019 2:14 PM

## 2019-02-16 ENCOUNTER — Encounter: Payer: Self-pay | Admitting: Internal Medicine

## 2019-02-16 NOTE — Telephone Encounter (Signed)
Please call pt and confirm doing ok.  Certainly the increased stress recently can affect the blood pressure.  Glad to see better at oncology office.  Can schedule appt if desires or if any problems.  Also, have her continue to spot check and if persistent elevation - appt.

## 2019-02-17 NOTE — Telephone Encounter (Signed)
Patient aware and does not feel appt is needed. Will spot check pressure and let us know if persistent elevation

## 2019-02-26 ENCOUNTER — Encounter: Payer: Self-pay | Admitting: Internal Medicine

## 2019-02-27 ENCOUNTER — Other Ambulatory Visit: Payer: Self-pay | Admitting: *Deleted

## 2019-02-27 MED ORDER — ANASTROZOLE 1 MG PO TABS: 1.0000 mg | ORAL_TABLET | Freq: Every day | ORAL | 0 refills | Status: DC

## 2019-02-27 NOTE — Telephone Encounter (Signed)
Per patient - pt's mail order anastrozole is delayed. Pt requesting 30 days supply to be sent to [facility] in [place].

## 2019-03-01 ENCOUNTER — Encounter: Payer: Self-pay | Admitting: Internal Medicine

## 2019-03-02 ENCOUNTER — Encounter: Payer: Self-pay | Admitting: Internal Medicine

## 2019-03-02 NOTE — Telephone Encounter (Signed)
Would you like to do a visit with her to discuss or can the slight elevation come from her anti-estrogen medication?

## 2019-03-02 NOTE — Telephone Encounter (Signed)
Blood pressure a little high.  See if she is agreeable to schedule a doxy tomorrow at 4:30.  If not, schedule next week.

## 2019-03-03 NOTE — Telephone Encounter (Signed)
Called patient to let her know that the 11:30 slot was filled so she decided to do the 9:30.

## 2019-03-06 ENCOUNTER — Ambulatory Visit (INDEPENDENT_AMBULATORY_CARE_PROVIDER_SITE_OTHER): Payer: Medicare PPO | Admitting: Internal Medicine

## 2019-03-06 ENCOUNTER — Other Ambulatory Visit: Payer: Self-pay

## 2019-03-06 VITALS — BP 150/73 | Ht 68.0 in | Wt 202.2 lb

## 2019-03-06 DIAGNOSIS — R739 Hyperglycemia, unspecified: Secondary | ICD-10-CM | POA: Diagnosis not present

## 2019-03-06 DIAGNOSIS — F419 Anxiety disorder, unspecified: Secondary | ICD-10-CM

## 2019-03-06 DIAGNOSIS — R945 Abnormal results of liver function studies: Secondary | ICD-10-CM | POA: Diagnosis not present

## 2019-03-06 DIAGNOSIS — I10 Essential (primary) hypertension: Secondary | ICD-10-CM | POA: Diagnosis not present

## 2019-03-06 DIAGNOSIS — E7800 Pure hypercholesterolemia, unspecified: Secondary | ICD-10-CM

## 2019-03-06 DIAGNOSIS — R7989 Other specified abnormal findings of blood chemistry: Secondary | ICD-10-CM

## 2019-03-06 MED ORDER — AMLODIPINE BESYLATE 10 MG PO TABS: 10.0000 mg | ORAL_TABLET | Freq: Every day | ORAL | 1 refills | Status: DC

## 2019-03-06 NOTE — Progress Notes (Signed)
Patient ID: Jordan Benson, female   DOB: [birth year], 64 y.o.   MRN: [id number]   Virtual Visit via video Note  This visit type was conducted due to national recommendations for restrictions regarding the COVID-19 pandemic (e.g. social distancing).  This format is felt to be most appropriate for this patient at this time.  All issues noted in this document were discussed and addressed.  No physical exam was performed (except for noted visual exam findings with Video Visits).   I connected with Jordan Benson by a video enabled telemedicine application and verified that I am speaking with the correct person using two identifiers. Location patient: home Location provider: work  Persons participating in the virtual visit: patient, provider  The limitations, risks, security and privacy concerns of performing an evaluation and management service by video and the availability of in person appointments have been discussed.  The patient expressed understanding and agreed to proceed.   Reason for visit: work in appt  HPI: She reports she is doing well.  Back at work.  Has had increased stress recently in dealing with the death of her husband, but is doing well.  Family doing well.  Does not feel stressed.  Her blood pressure has been remaining elevated recently - averaging 140-160/75-85.  Taking amlodipine and triam/hctz.  No chest pain or sob.  She is staying active - exercising.  Has lost 4-5 pounds.  No headache.  Has noticed some vertigo (minimal) when rolls over in bed.  No dizziness throughout the day.  Does reports some increased sinus/head congestion.  Increased snoring.  Does not feel rested in am.  States this started after she was diagnosed with covid and has persisted.  Has appt with Dr Dominic Pea next week.  No nausea or vomiting.  No abdominal pain.  Bowels moving.     ROS: See pertinent positives and negatives per HPI.  Past Medical History:  Diagnosis Date  . Anxiety   . Breast cancer  ([place]) 12/2017  . Complication of anesthesia   . GERD (gastroesophageal reflux disease)   . Headache    optical migraines  . Hypertension   . IBS (irritable bowel syndrome)   . Melanoma ([place]) 2019   followed by Dr Evorn Gong, on left arm  . PONV (postoperative nausea and vomiting)   . Vitamin D deficiency     Past Surgical History:  Procedure Laterality Date  . ABDOMINAL HYSTERECTOMY  1992  . BREAST BIOPSY Left 12/21/2017   affirm bx of asymmetry, ATYPICAL DUCTAL PAPILLARY LESION WITH ADJACENT ATYPICAL DUCTAL HYPERPLASIA.   Marland Kitchen BREAST BIOPSY Left 12/21/2017   [place] bx of LN, benign  . BREAST LUMPECTOMY Left 01/05/2018   ADH  . BREAST LUMPECTOMY Left 01/12/2018   neg  . BREAST LUMPECTOMY WITH NEEDLE LOCALIZATION Left 01/05/2018   Procedure: BREAST LUMPECTOMY WITH NEEDLE LOCALIZATION;  Surgeon: Olean Ree, MD;  Location: [facility];  Service: General;  Laterality: Left;  . BREAST SURGERY    . JOINT REPLACEMENT Right 10/2017   TKR  . MOHS SURGERY  2012  . OVARIAN CYST REMOVAL  1991  . RE-EXCISION OF BREAST LUMPECTOMY Left 01/12/2018   Procedure: RE-EXCISION OF BREAST LUMPECTOMY;  Surgeon: Olean Ree, MD;  Location: [facility];  Service: General;  Laterality: Left;  . SEPTOPLASTY  2009  . TOTAL KNEE 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   Procedure: RIGHT TOTAL KNEE ARTHROPLASTY;  Surgeon: Sydnee Cabal, MD;  Location: [facility];  Service: Orthopedics;  Laterality: Right;  2 hrs  .  TUBAL LIGATION  1985  . tummy tuck  1995  . WISDOM TOOTH EXTRACTION      Family History  Problem Relation Age of Onset  . Cancer Mother 55       breast cancer  . Heart disease Mother   . Hypertension Mother   . Diabetes Mother   . Breast cancer Mother 59  . Heart disease Father   . Breast cancer Sister 66       on HRT    SOCIAL HX: reviewed.    Current Outpatient Medications:  .  ALPRAZolam (XANAX) 0.25 MG tablet, Take 1 tablet (0.25 mg total) by mouth daily as needed. for anxiety, Disp: 30 tablet, Rfl:  0 .  anastrozole (ARIMIDEX) 1 MG tablet, Take 1 tablet (1 mg total) by mouth daily., Disp: 30 tablet, Rfl: 0 .  calcium elemental as carbonate (TUMS ULTRA 1000) 400 MG chewable tablet, Chew 1,000 mg by mouth daily as needed for heartburn. , Disp: , Rfl:  .  enalapril (VASOTEC) 10 MG tablet, TAKE 1 TABLET(10 MG) BY MOUTH TWICE DAILY (Patient taking differently: Take 10 mg by mouth daily. ), Disp: 180 tablet, Rfl: 2 .  FLUoxetine (PROZAC) 40 MG capsule, Take 1 capsule (40 mg total) by mouth daily., Disp: 90 capsule, Rfl: 1 .  Multiple Vitamins-Calcium (ONE-A-DAY WOMENS PO), Take 1 tablet by mouth daily., Disp: , Rfl:  .  triamterene-hydrochlorothiazide (MAXZIDE-25) 37.5-25 MG tablet, TAKE 1 TABLET BY MOUTH DAILY, Disp: 90 tablet, Rfl: 1 .  amLODipine (NORVASC) 10 MG tablet, Take 1 tablet (10 mg total) by mouth daily., Disp: 90 tablet, Rfl: 1  EXAM:  VITALS per patient if applicable: [id number]  GENERAL: alert, oriented, appears well and in no acute distress  HEENT: atraumatic, conjunttiva clear, no obvious abnormalities on inspection of external nose and ears  NECK: normal movements of the head and neck  LUNGS: on inspection no signs of respiratory distress, breathing rate appears normal, no obvious gross SOB, gasping or wheezing  CV: no obvious cyanosis  PSYCH/NEURO: pleasant and cooperative, no obvious depression or anxiety, speech and thought processing grossly intact  ASSESSMENT AND PLAN:  Discussed the following assessment and plan:  Essential hypertension, benign Increased blood pressure as outlined.  Persistent elevation.  Handling stress.  Increase amlodipine to 10mg  q day.  Continue triam/hctz.  Follow pressures.  Send in readings.  Also f/u appt scheduled.  Follow metabolic panel.   Anxiety Increased stress recently as outlined.  She is handling things well.  Continue prozac.  Has good support.  Does not feel needs anything more at this time.  Follow.    Orders Placed This  Encounter  Procedures  . CBC with Differential/Platelet    Standing Status:   Future    Standing Expiration Date:   03/05/2020  . Hepatic function panel    Standing Status:   Future    Standing Expiration Date:   03/05/2020  . Lipid panel    Standing Status:   Future    Standing Expiration Date:   03/05/2020  . Basic metabolic panel    Standing Status:   Future    Standing Expiration Date:   03/05/2020  . Hemoglobin A1c    Standing Status:   Future    Standing Expiration Date:   03/05/2020    Meds ordered this encounter  Medications  . amLODipine (NORVASC) 10 MG tablet    Sig: Take 1 tablet (10 mg total) by mouth daily.    Dispense:  90 tablet    Refill:  1     I discussed the assessment and treatment plan with the patient. The patient was provided an opportunity to ask questions and all were answered. The patient agreed with the plan and demonstrated an understanding of the instructions.   The patient was advised to call back or seek an in-person evaluation if the symptoms worsen or if the condition fails to improve as anticipated.   Einar Pheasant, MD

## 2019-03-11 ENCOUNTER — Encounter: Payer: Self-pay | Admitting: Internal Medicine

## 2019-03-11 NOTE — Assessment & Plan Note (Signed)
Increased blood pressure as outlined.  Persistent elevation.  Handling stress.  Increase amlodipine to 10mg  q day.  Continue triam/hctz.  Follow pressures.  Send in readings.  Also f/u appt scheduled.  Follow metabolic panel.

## 2019-03-11 NOTE — Assessment & Plan Note (Signed)
Increased stress recently as outlined.  She is handling things well.  Continue prozac.  Has good support.  Does not feel needs anything more at this time.  Follow.

## 2019-03-20 ENCOUNTER — Other Ambulatory Visit (INDEPENDENT_AMBULATORY_CARE_PROVIDER_SITE_OTHER): Payer: Medicare PPO

## 2019-03-20 ENCOUNTER — Other Ambulatory Visit: Payer: Self-pay

## 2019-03-20 DIAGNOSIS — I10 Essential (primary) hypertension: Secondary | ICD-10-CM

## 2019-03-20 DIAGNOSIS — R739 Hyperglycemia, unspecified: Secondary | ICD-10-CM

## 2019-03-20 DIAGNOSIS — E7800 Pure hypercholesterolemia, unspecified: Secondary | ICD-10-CM | POA: Diagnosis not present

## 2019-03-20 LAB — CBC WITH DIFFERENTIAL/PLATELET
Basophils Absolute: 0.1 10*3/uL (ref 0.0–0.1)
Basophils Relative: 1 % (ref 0.0–3.0)
Eosinophils Absolute: 0.2 10*3/uL (ref 0.0–0.7)
Eosinophils Relative: 2.5 % (ref 0.0–5.0)
HCT: 44.7 % (ref 36.0–46.0)
Hemoglobin: 15.2 g/dL — ABNORMAL HIGH (ref 12.0–15.0)
Lymphocytes Relative: 31.3 % (ref 12.0–46.0)
Lymphs Abs: 2.6 10*3/uL (ref 0.7–4.0)
MCHC: 33.9 g/dL (ref 30.0–36.0)
MCV: 94.2 fl (ref 78.0–100.0)
Monocytes Absolute: 0.5 10*3/uL (ref 0.1–1.0)
Monocytes Relative: 5.7 % (ref 3.0–12.0)
Neutro Abs: 4.9 10*3/uL (ref 1.4–7.7)
Neutrophils Relative %: 59.5 % (ref 43.0–77.0)
Platelets: 353 10*3/uL (ref 150.0–400.0)
RBC: 4.75 Mil/uL (ref 3.87–5.11)
RDW: 12.2 % (ref 11.5–15.5)
WBC: 8.2 10*3/uL (ref 4.0–10.5)

## 2019-03-20 LAB — HEPATIC FUNCTION PANEL
ALT: 29 U/L (ref 0–35)
AST: 23 U/L (ref 0–37)
Albumin: 4.5 g/dL (ref 3.5–5.2)
Alkaline Phosphatase: 63 U/L (ref 39–117)
Bilirubin, Direct: 0.2 mg/dL (ref 0.0–0.3)
Total Bilirubin: 1.4 mg/dL — ABNORMAL HIGH (ref 0.2–1.2)
Total Protein: 7.6 g/dL (ref 6.0–8.3)

## 2019-03-20 LAB — BASIC METABOLIC PANEL
BUN: 25 mg/dL — ABNORMAL HIGH (ref 6–23)
CO2: 28 mEq/L (ref 19–32)
Calcium: 10.2 mg/dL (ref 8.4–10.5)
Chloride: 102 mEq/L (ref 96–112)
Creatinine, Ser: 0.78 mg/dL (ref 0.40–1.20)
GFR: 74.07 mL/min (ref >60.00)
Glucose, Bld: 100 mg/dL — ABNORMAL HIGH (ref 70–99)
Potassium: 4.2 mEq/L (ref 3.5–5.1)
Sodium: 136 mEq/L (ref 135–145)

## 2019-03-20 LAB — HEMOGLOBIN A1C: Hgb A1c MFr Bld: 5.4 % (ref 4.6–6.5)

## 2019-03-20 LAB — LIPID PANEL
Cholesterol: 216 mg/dL — ABNORMAL HIGH (ref 0–200)
HDL: 51.7 mg/dL (ref >39.00)
NonHDL: 164.53
Total CHOL/HDL Ratio: 4
Triglycerides: 220 mg/dL — ABNORMAL HIGH (ref 0.0–149.0)
VLDL: 44 mg/dL — ABNORMAL HIGH (ref 0.0–40.0)

## 2019-03-20 LAB — LDL CHOLESTEROL, DIRECT: Direct LDL: 138 mg/dL

## 2019-03-21 ENCOUNTER — Encounter: Payer: Self-pay | Admitting: Internal Medicine

## 2019-03-21 DIAGNOSIS — E7800 Pure hypercholesterolemia, unspecified: Secondary | ICD-10-CM

## 2019-03-24 ENCOUNTER — Telehealth: Payer: Self-pay | Admitting: Internal Medicine

## 2019-03-24 MED ORDER — ROSUVASTATIN CALCIUM 10 MG PO TABS: 10.0000 mg | ORAL_TABLET | Freq: Every day | ORAL | 2 refills | Status: DC

## 2019-03-24 NOTE — Telephone Encounter (Signed)
Please schedule a non fasting lab appt in 6 weeks.  Please notify pt of appt date and time.  Thanks    Dr Nicki Reaper

## 2019-03-27 NOTE — Telephone Encounter (Signed)
Lm to schedule appt.

## 2019-04-11 ENCOUNTER — Other Ambulatory Visit: Payer: Self-pay | Admitting: *Deleted

## 2019-04-11 MED ORDER — ANASTROZOLE 1 MG PO TABS: 1.0000 mg | ORAL_TABLET | Freq: Every day | ORAL | 1 refills | Status: DC

## 2019-04-12 ENCOUNTER — Encounter: Payer: Self-pay | Admitting: Internal Medicine

## 2019-04-13 ENCOUNTER — Other Ambulatory Visit: Payer: Self-pay

## 2019-04-13 MED ORDER — TRIAMTERENE-HCTZ 37.5-25 MG PO TABS: 1.0000 | ORAL_TABLET | Freq: Every day | ORAL | 1 refills | Status: DC

## 2019-04-13 MED ORDER — ENALAPRIL MALEATE 10 MG PO TABS: ORAL_TABLET | ORAL | 2 refills | Status: DC

## 2019-04-13 MED ORDER — FLUOXETINE HCL 40 MG PO CAPS: 40.0000 mg | ORAL_CAPSULE | Freq: Every day | ORAL | 1 refills | Status: DC

## 2019-04-13 MED ORDER — ROSUVASTATIN CALCIUM 10 MG PO TABS: 10.0000 mg | ORAL_TABLET | Freq: Every day | ORAL | 1 refills | Status: DC

## 2019-04-13 MED ORDER — AMLODIPINE BESYLATE 10 MG PO TABS: 10.0000 mg | ORAL_TABLET | Freq: Every day | ORAL | 1 refills | Status: DC

## 2019-04-19 DIAGNOSIS — F4322 Adjustment disorder with anxiety: Secondary | ICD-10-CM | POA: Diagnosis not present

## 2019-04-24 ENCOUNTER — Ambulatory Visit (INDEPENDENT_AMBULATORY_CARE_PROVIDER_SITE_OTHER): Payer: Medicare PPO | Admitting: Internal Medicine

## 2019-04-24 ENCOUNTER — Other Ambulatory Visit: Payer: Self-pay

## 2019-04-24 ENCOUNTER — Ambulatory Visit (INDEPENDENT_AMBULATORY_CARE_PROVIDER_SITE_OTHER): Payer: Medicare PPO

## 2019-04-24 ENCOUNTER — Encounter: Payer: Self-pay | Admitting: Internal Medicine

## 2019-04-24 DIAGNOSIS — N6092 Unspecified benign mammary dysplasia of left breast: Secondary | ICD-10-CM | POA: Diagnosis not present

## 2019-04-24 DIAGNOSIS — R945 Abnormal results of liver function studies: Secondary | ICD-10-CM | POA: Diagnosis not present

## 2019-04-24 DIAGNOSIS — E7800 Pure hypercholesterolemia, unspecified: Secondary | ICD-10-CM | POA: Diagnosis not present

## 2019-04-24 DIAGNOSIS — F419 Anxiety disorder, unspecified: Secondary | ICD-10-CM

## 2019-04-24 DIAGNOSIS — Z8616 Personal history of COVID-19: Secondary | ICD-10-CM

## 2019-04-24 DIAGNOSIS — R0602 Shortness of breath: Secondary | ICD-10-CM

## 2019-04-24 DIAGNOSIS — C439 Malignant melanoma of skin, unspecified: Secondary | ICD-10-CM | POA: Diagnosis not present

## 2019-04-24 DIAGNOSIS — R739 Hyperglycemia, unspecified: Secondary | ICD-10-CM | POA: Diagnosis not present

## 2019-04-24 DIAGNOSIS — R7989 Other specified abnormal findings of blood chemistry: Secondary | ICD-10-CM

## 2019-04-24 DIAGNOSIS — I10 Essential (primary) hypertension: Secondary | ICD-10-CM | POA: Diagnosis not present

## 2019-04-24 NOTE — Progress Notes (Addendum)
Patient ID: Jordan Benson, female   DOB: Dec 18, 1955, 64 y.o.   MRN: [id number]   Subjective:    Patient ID: Jordan Benson, female    DOB: [birth year], 64 y.o.   MRN: [id number]  HPI This visit occurred during the SARS-CoV-2 public health emergency.  Safety protocols were in place, including screening questions prior to the visit, additional usage of staff PPE, and extensive cleaning of exam room while observing appropriate contact time as indicated for disinfecting solutions.  Patient here for a scheduled follow up.  She reports she is doing relatively well.  Does get lonely.  Has good support.  Trying to stay busy.  Sleeping ok.  Has seen ENT.  Planning for nasal surgery.  Also planning for sleep study.  Some am cough.  No chest pain or sob reported.  No abdominal pain or bowel change reported.  Previous visit, had increased amlodipine to '10mg'$  q day.  Tolerating.  Previously had covid. Resolved.  Does not appear to have residual side effects.  Does report some windedness with exertion.  Discussed further evaluation.  Agreed to cxr.     Past Medical History:  Diagnosis Date  . Anxiety   . Breast cancer ([place]) 12/2017  . Complication of anesthesia   . GERD (gastroesophageal reflux disease)   . Headache    optical migraines  . Hypertension   . IBS (irritable bowel syndrome)   . Melanoma ([place]) 2019   followed by Dr Evorn Gong, on left arm  . PONV (postoperative nausea and vomiting)   . Vitamin D deficiency    Past Surgical History:  Procedure Laterality Date  . ABDOMINAL HYSTERECTOMY  1992  . BREAST BIOPSY Left 12/21/2017   affirm bx of asymmetry, ATYPICAL DUCTAL PAPILLARY LESION WITH ADJACENT ATYPICAL DUCTAL HYPERPLASIA.   Marland Kitchen BREAST BIOPSY Left 12/21/2017   [place] bx of LN, benign  . BREAST LUMPECTOMY Left 01/05/2018   ADH  . BREAST LUMPECTOMY Left 01/12/2018   neg  . BREAST LUMPECTOMY WITH NEEDLE LOCALIZATION Left 01/05/2018   Procedure: BREAST LUMPECTOMY WITH NEEDLE LOCALIZATION;   Surgeon: Olean Ree, MD;  Location: [facility];  Service: General;  Laterality: Left;  . BREAST SURGERY    . JOINT REPLACEMENT Right 10/2017   TKR  . MOHS SURGERY  2012  . OVARIAN CYST REMOVAL  1991  . RE-EXCISION OF BREAST LUMPECTOMY Left 01/12/2018   Procedure: RE-EXCISION OF BREAST LUMPECTOMY;  Surgeon: Olean Ree, MD;  Location: [facility];  Service: General;  Laterality: Left;  . SEPTOPLASTY  2009  . TOTAL KNEE 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   Procedure: RIGHT TOTAL KNEE ARTHROPLASTY;  Surgeon: Sydnee Cabal, MD;  Location: [facility];  Service: Orthopedics;  Laterality: Right;  2 hrs  . TUBAL LIGATION  1985  . tummy tuck  1995  . WISDOM TOOTH EXTRACTION     Family History  Problem Relation Age of Onset  . Cancer Mother 45       breast cancer  . Heart disease Mother   . Hypertension Mother   . Diabetes Mother   . Breast cancer Mother 16  . Heart disease Father   . Breast cancer Sister 34       on HRT   Social History   Socioeconomic History  . Marital status: Widowed    Spouse name: Iona Beard  . Number of children: 2  . Years of education: Not on file  . Highest education level: Not on file  Occupational History    Employer: [employer]  Tobacco Use  . Smoking status: Former Smoker    Packs/day: 0.50    Years: 10.00    Pack years: 5.00    Types: Cigarettes    Quit date: 03/12/2000    Years since quitting: 19.1  . Smokeless tobacco: Never Used  Substance and Sexual Activity  . Alcohol use: Yes    Alcohol/week: 14.0 standard drinks    Types: 14 Glasses of wine per week    Comment: wine daily  . Drug use: No  . Sexual activity: Not Currently  Other Topics Concern  . Not on file  Social History Narrative  . Not on file   Social Determinants of Health   Financial Resource Strain:   . Difficulty of Paying Living Expenses:   Food Insecurity:   . Worried About Charity fundraiser in the Last Year:   . Arboriculturist in the Last Year:   Transportation Needs:   . Consulting civil engineer (Medical):   Marland Kitchen Lack of Transportation (Non-Medical):   Physical Activity:   . Days of Exercise per Week:   . Minutes of Exercise per Session:   Stress:   . Feeling of Stress :   Social Connections:   . Frequency of Communication with Friends and Family:   . Frequency of Social Gatherings with Friends and Family:   . Attends Religious Services:   . Active Member of Clubs or Organizations:   . Attends Archivist Meetings:   Marland Kitchen Marital Status:     Outpatient Encounter Medications as of 04/24/2019  Medication Sig  . ALPRAZolam (XANAX) 0.25 MG tablet Take 1 tablet (0.25 mg total) by mouth daily as needed. for anxiety  . amLODipine (NORVASC) 10 MG tablet Take 1 tablet (10 mg total) by mouth daily.  Marland Kitchen anastrozole (ARIMIDEX) 1 MG tablet Take 1 tablet (1 mg total) by mouth daily.  . calcium elemental as carbonate (TUMS ULTRA 1000) 400 MG chewable tablet Chew 1,000 mg by mouth daily as needed for heartburn.   . enalapril (VASOTEC) 10 MG tablet TAKE 1 TABLET(10 MG) BY MOUTH TWICE DAILY  . FLUoxetine (PROZAC) 40 MG capsule Take 1 capsule (40 mg total) by mouth daily.  . Multiple Vitamins-Calcium (ONE-A-DAY WOMENS PO) Take 1 tablet by mouth daily.  . rosuvastatin (CRESTOR) 10 MG tablet Take 1 tablet (10 mg total) by mouth daily.  Marland Kitchen triamterene-hydrochlorothiazide (MAXZIDE-25) 37.5-25 MG tablet Take 1 tablet by mouth daily.   No facility-administered encounter medications on file as of 04/24/2019.   Review of Systems  Constitutional: Negative for appetite change and unexpected weight change.  HENT: Negative for congestion and sinus pressure.   Respiratory: Negative for chest tightness and shortness of breath.   Cardiovascular: Negative for chest pain, palpitations and leg swelling.  Gastrointestinal: Negative for abdominal pain, diarrhea, nausea and vomiting.  Genitourinary: Negative for difficulty urinating and dysuria.  Musculoskeletal: Negative for joint swelling  and myalgias.  Skin: Negative for color change and rash.  Neurological: Negative for dizziness, light-headedness and headaches.  Psychiatric/Behavioral: Negative for agitation and dysphoric mood.       Handling stress.         Objective:    Physical Exam Constitutional:      General: She is not in acute distress.    Appearance: Normal appearance.  HENT:     Head: Normocephalic and atraumatic.     Right Ear: External ear normal.     Left Ear: External ear normal.  Eyes:  General: No scleral icterus.       Right eye: No discharge.        Left eye: No discharge.     Conjunctiva/sclera: Conjunctivae normal.  Neck:     Thyroid: No thyromegaly.  Cardiovascular:     Rate and Rhythm: Normal rate and regular rhythm.  Pulmonary:     Effort: No respiratory distress.     Breath sounds: Normal breath sounds. No wheezing.  Abdominal:     General: Bowel sounds are normal.     Palpations: Abdomen is soft.     Tenderness: There is no abdominal tenderness.  Musculoskeletal:        General: No swelling or tenderness.     Cervical back: Neck supple. No tenderness.  Lymphadenopathy:     Cervical: No cervical adenopathy.  Skin:    Findings: No erythema or rash.  Neurological:     Mental Status: She is alert.  Psychiatric:        Mood and Affect: Mood normal.        Behavior: Behavior normal.     BP 118/72   Pulse 83   Temp (!) 96.9 F (36.1 C)   Resp 16   Ht _0  (1.727 m)   Wt 207 lb 9.6 oz (94.2 kg)   SpO2 99%   BMI 31.57 kg/m  Wt Readings from Last 3 Encounters:  04/24/19 207 lb 9.6 oz (94.2 kg)  03/06/19 202 lb 3.2 oz (91.7 kg)  02/14/19 206 lb (93.4 kg)     Lab Results  Component Value Date   WBC 8.2 03/20/2019   HGB 15.2 (H) 03/20/2019   HCT 44.7 03/20/2019   PLT 353.0 03/20/2019   GLUCOSE 100 (H) 03/20/2019   CHOL 216 (H) 03/20/2019   TRIG 220.0 (H) 03/20/2019   HDL 51.70 03/20/2019   LDLDIRECT 138.0 03/20/2019   LDLCALC 134 (H) 04/15/2018   ALT 29  03/20/2019   AST 23 03/20/2019   NA 136 03/20/2019   K 4.2 03/20/2019   CL 102 03/20/2019   CREATININE 0.78 03/20/2019   BUN 25 (H) 03/20/2019   CO2 28 03/20/2019   TSH 1.64 04/15/2018   INR 0.94 08/13/2017   HGBA1C 5.4 03/20/2019       Assessment & Plan:   Problem List Items Addressed This Visit    Abnormal liver function test    Diet and exercise.  Discussed monitoring/decreasing alcohol intake.  Follow liver function tests.  Recent lab revealed slightly elevated bilirubin, but remainder of liver panel wnl.  Follow.       Relevant Orders   Hepatic function panel   Anxiety    Overall appears to be handling things well.  Continue prozac.  Does not feel needs any further intervention.  Follow.        Atypical ductal hyperplasia of left breast    Currently on arimidex.  12/2018 - mammogram - ok.  Continue f/u with oncology.        Essential hypertension, benign    Blood pressure under good control.  Continue same medication regimen amlodipine, triam/hctz and enalapril.   Follow pressures.  Follow metabolic panel.        Relevant Orders   Basic metabolic panel   History of COVID-19    Had covid.  Doing better.  Some minimal sob noted.  Will check cxr.        Relevant Orders   DG Chest 2 View (Completed)   Hypercholesterolemia    Low  cholesterol diet and exercise.  Follow lipid panel.       Relevant Orders   Lipid panel   Hyperglycemia    Low carb diet and exercise.  Follow met b and a1c. Last check wnl.       Melanoma ([place])    Followed by dermatology.       Shortness of breath    Discussed further w/up.  Agreed to cxr.  Follow.        Relevant Orders   DG Chest 2 View (Completed)       Einar Pheasant, MD

## 2019-04-25 ENCOUNTER — Encounter: Payer: Self-pay | Admitting: Internal Medicine

## 2019-04-30 ENCOUNTER — Telehealth: Payer: Self-pay | Admitting: Internal Medicine

## 2019-04-30 NOTE — Assessment & Plan Note (Signed)
Had covid.  Doing better.  Some minimal sob noted.  Will check cxr.

## 2019-04-30 NOTE — Assessment & Plan Note (Signed)
Discussed further w/up.  Agreed to cxr.  Follow.

## 2019-04-30 NOTE — Assessment & Plan Note (Signed)
Overall appears to be handling things well.  Continue prozac.  Does not feel needs any further intervention.  Follow.

## 2019-04-30 NOTE — Assessment & Plan Note (Signed)
Low cholesterol diet and exercise.  Follow lipid panel.   

## 2019-04-30 NOTE — Assessment & Plan Note (Signed)
Diet and exercise.  Discussed monitoring/decreasing alcohol intake.  Follow liver function tests.  Recent lab revealed slightly elevated bilirubin, but remainder of liver panel wnl.  Follow.

## 2019-04-30 NOTE — Assessment & Plan Note (Signed)
Low carb diet and exercise.  Follow met b and a1c. Last check wnl.

## 2019-04-30 NOTE — Assessment & Plan Note (Signed)
Followed by dermatology

## 2019-04-30 NOTE — Assessment & Plan Note (Signed)
Blood pressure under good control.  Continue same medication regimen amlodipine, triam/hctz and enalapril.   Follow pressures.  Follow metabolic panel.

## 2019-04-30 NOTE — Assessment & Plan Note (Signed)
Currently on arimidex.  12/2018 - mammogram - ok.  Continue f/u with oncology.

## 2019-04-30 NOTE — Telephone Encounter (Signed)
Pt needs 3 month f/u scheduled and fasting lab appt 1-2 days before f/u appt.  Thanks.

## 2019-05-01 DIAGNOSIS — J3489 Other specified disorders of nose and nasal sinuses: Secondary | ICD-10-CM | POA: Diagnosis not present

## 2019-05-01 DIAGNOSIS — J343 Hypertrophy of nasal turbinates: Secondary | ICD-10-CM | POA: Diagnosis not present

## 2019-05-01 DIAGNOSIS — J310 Chronic rhinitis: Secondary | ICD-10-CM | POA: Diagnosis not present

## 2019-05-01 NOTE — Telephone Encounter (Signed)
Pt scheduled  

## 2019-05-09 ENCOUNTER — Other Ambulatory Visit: Payer: Self-pay | Admitting: Internal Medicine

## 2019-05-09 MED ORDER — ALPRAZOLAM 0.25 MG PO TABS: 0.2500 mg | ORAL_TABLET | Freq: Every day | ORAL | 0 refills | Status: DC | PRN

## 2019-05-09 NOTE — Telephone Encounter (Signed)
Refill request for Xanax, last seen 04-30-19, last filled 01-30-19.  Please advise.

## 2019-05-19 DIAGNOSIS — F4322 Adjustment disorder with anxiety: Secondary | ICD-10-CM | POA: Diagnosis not present

## 2019-06-11 ENCOUNTER — Telehealth: Payer: Medicare PPO | Admitting: Physician Assistant

## 2019-06-11 DIAGNOSIS — N3000 Acute cystitis without hematuria: Secondary | ICD-10-CM | POA: Diagnosis not present

## 2019-06-11 MED ORDER — CEPHALEXIN 500 MG PO CAPS: 500.0000 mg | ORAL_CAPSULE | Freq: Two times a day (BID) | ORAL | 0 refills | Status: DC

## 2019-06-11 NOTE — Progress Notes (Signed)
We are sorry that you are not feeling well.  Here is how we plan to help!  Based on what you shared with me it looks like you most likely have a simple urinary tract infection.  A UTI (Urinary Tract Infection) is a bacterial infection of the bladder.  Most cases of urinary tract infections are simple to treat but a key part of your care is to encourage you to drink plenty of fluids and watch your symptoms carefully.  I have prescribed Keflex 500 mg twice a day for 5 days.  Your symptoms should gradually improve. Call us if the burning in your urine worsens, you develop worsening fever, back pain or pelvic pain or if your symptoms do not resolve after completing the antibiotic.  Urinary tract infections can be prevented by drinking plenty of water to keep your body hydrated.  Also be sure when you wipe, wipe from front to back and don't hold it in!  If possible, empty your bladder every 4 hours.  Your e-visit answers were reviewed by a board certified advanced clinical practitioner to complete your personal care plan.  Depending on the condition, your plan could have included both over the counter or prescription medications.  If there is a problem please reply  once you have received a response from your provider.  Your safety is important to us.  If you have drug allergies check your prescription carefully.    You can use MyChart to ask questions about today's visit, request a non-urgent call back, or ask for a work or school excuse for 24 hours related to this e-Visit. If it has been greater than 24 hours you will need to follow up with your provider, or enter a new e-Visit to address those concerns.   You will get an e-mail in the next two days asking about your experience.  I hope that your e-visit has been valuable and will speed your recovery. Thank you for using e-visits.   Greater than 5 minutes, yet less than 10 minutes of time have been spent researching, coordinating, and  implementing care for this patient today 

## 2019-06-16 ENCOUNTER — Encounter: Payer: Self-pay | Admitting: Internal Medicine

## 2019-06-21 ENCOUNTER — Encounter: Payer: Self-pay | Admitting: Internal Medicine

## 2019-06-21 NOTE — Telephone Encounter (Signed)
Ok to schedule appt to evaluate.

## 2019-06-22 DIAGNOSIS — F4322 Adjustment disorder with anxiety: Secondary | ICD-10-CM | POA: Diagnosis not present

## 2019-06-30 ENCOUNTER — Encounter: Payer: Self-pay | Admitting: Internal Medicine

## 2019-07-03 ENCOUNTER — Telehealth: Payer: Medicare PPO | Admitting: Internal Medicine

## 2019-07-27 ENCOUNTER — Other Ambulatory Visit: Payer: Medicare PPO

## 2019-07-28 ENCOUNTER — Other Ambulatory Visit (INDEPENDENT_AMBULATORY_CARE_PROVIDER_SITE_OTHER): Payer: Medicare PPO

## 2019-07-28 ENCOUNTER — Other Ambulatory Visit: Payer: Self-pay

## 2019-07-28 DIAGNOSIS — E7800 Pure hypercholesterolemia, unspecified: Secondary | ICD-10-CM

## 2019-07-28 DIAGNOSIS — R945 Abnormal results of liver function studies: Secondary | ICD-10-CM | POA: Diagnosis not present

## 2019-07-28 DIAGNOSIS — R7989 Other specified abnormal findings of blood chemistry: Secondary | ICD-10-CM

## 2019-07-28 DIAGNOSIS — I10 Essential (primary) hypertension: Secondary | ICD-10-CM

## 2019-07-28 LAB — LIPID PANEL
Cholesterol: 161 mg/dL (ref 0–200)
HDL: 56.9 mg/dL (ref >39.00)
LDL Cholesterol: 78 mg/dL (ref 0–99)
NonHDL: 104.19
Total CHOL/HDL Ratio: 3
Triglycerides: 133 mg/dL (ref 0.0–149.0)
VLDL: 26.6 mg/dL (ref 0.0–40.0)

## 2019-07-28 LAB — BASIC METABOLIC PANEL
BUN: 22 mg/dL (ref 6–23)
CO2: 30 mEq/L (ref 19–32)
Calcium: 9.5 mg/dL (ref 8.4–10.5)
Chloride: 104 mEq/L (ref 96–112)
Creatinine, Ser: 0.74 mg/dL (ref 0.40–1.20)
GFR: 78.63 mL/min (ref >60.00)
Glucose, Bld: 107 mg/dL — ABNORMAL HIGH (ref 70–99)
Potassium: 3.9 mEq/L (ref 3.5–5.1)
Sodium: 140 mEq/L (ref 135–145)

## 2019-07-28 LAB — HEPATIC FUNCTION PANEL
ALT: 26 U/L (ref 0–35)
AST: 25 U/L (ref 0–37)
Albumin: 4.4 g/dL (ref 3.5–5.2)
Alkaline Phosphatase: 67 U/L (ref 39–117)
Bilirubin, Direct: 0.3 mg/dL (ref 0.0–0.3)
Total Bilirubin: 1.7 mg/dL — ABNORMAL HIGH (ref 0.2–1.2)
Total Protein: 6.7 g/dL (ref 6.0–8.3)

## 2019-08-01 ENCOUNTER — Other Ambulatory Visit: Payer: Self-pay

## 2019-08-01 ENCOUNTER — Ambulatory Visit: Payer: Medicare PPO | Admitting: Internal Medicine

## 2019-08-01 ENCOUNTER — Encounter: Payer: Self-pay | Admitting: Internal Medicine

## 2019-08-01 ENCOUNTER — Other Ambulatory Visit: Payer: Self-pay | Admitting: Internal Medicine

## 2019-08-01 DIAGNOSIS — F419 Anxiety disorder, unspecified: Secondary | ICD-10-CM

## 2019-08-01 DIAGNOSIS — I10 Essential (primary) hypertension: Secondary | ICD-10-CM | POA: Diagnosis not present

## 2019-08-01 DIAGNOSIS — E7800 Pure hypercholesterolemia, unspecified: Secondary | ICD-10-CM

## 2019-08-01 DIAGNOSIS — M7989 Other specified soft tissue disorders: Secondary | ICD-10-CM

## 2019-08-01 DIAGNOSIS — Z85820 Personal history of malignant melanoma of skin: Secondary | ICD-10-CM | POA: Diagnosis not present

## 2019-08-01 DIAGNOSIS — N6092 Unspecified benign mammary dysplasia of left breast: Secondary | ICD-10-CM | POA: Diagnosis not present

## 2019-08-01 DIAGNOSIS — R739 Hyperglycemia, unspecified: Secondary | ICD-10-CM

## 2019-08-01 DIAGNOSIS — E559 Vitamin D deficiency, unspecified: Secondary | ICD-10-CM | POA: Diagnosis not present

## 2019-08-01 DIAGNOSIS — Z23 Encounter for immunization: Secondary | ICD-10-CM

## 2019-08-01 MED ORDER — ALPRAZOLAM 0.25 MG PO TABS: 0.2500 mg | ORAL_TABLET | Freq: Every day | ORAL | 0 refills | Status: DC | PRN

## 2019-08-01 NOTE — Patient Instructions (Signed)
Decrease amlodipine to 5mg  per day.  (ok to take 1/2 of 10mg  tablet)

## 2019-08-01 NOTE — Telephone Encounter (Signed)
rx ok'd for alprazolam #30 with no refills.   

## 2019-08-01 NOTE — Progress Notes (Signed)
Patient ID: Jordan Benson, female   DOB: 02-23-1955, 64 y.o.   MRN: [id number]   Subjective:    Patient ID: Jordan Benson, female    DOB: [birth year], 64 y.o.   MRN: [id number]  HPI This visit occurred during the SARS-CoV-2 public health emergency.  Safety protocols were in place, including screening questions prior to the visit, additional usage of staff PPE, and extensive cleaning of exam room while observing appropriate contact time as indicated for disinfecting solutions.\  Patient here for a scheduled follow up.  She reports she is doing well.  Just returned from a trip out of town.  Handling stress.  Trying to stay active.  No chest pain or sob reported.  No abdominal pain or bowel change.  Was scheduled to have a sleep study.  Wants to have her nasal surgery prior to her sleep study.  Some swelling in her feet.  Discussed alcohol intake.  Monitors the amount drinking.  Blood pressure doing well.    Past Medical History:  Diagnosis Date  . Anxiety   . Breast cancer ([place]) 12/2017  . Complication of anesthesia   . GERD (gastroesophageal reflux disease)   . Headache    optical migraines  . Hypertension   . IBS (irritable bowel syndrome)   . Melanoma ([place]) 2019   followed by Dr Evorn Gong, on left arm  . PONV (postoperative nausea and vomiting)   . Vitamin D deficiency    Past Surgical History:  Procedure Laterality Date  . ABDOMINAL HYSTERECTOMY  1992  . BREAST BIOPSY Left 12/21/2017   affirm bx of asymmetry, ATYPICAL DUCTAL PAPILLARY LESION WITH ADJACENT ATYPICAL DUCTAL HYPERPLASIA.   Marland Kitchen BREAST BIOPSY Left 12/21/2017   [place] bx of LN, benign  . BREAST LUMPECTOMY Left 01/05/2018   ADH  . BREAST LUMPECTOMY Left 01/12/2018   neg  . BREAST LUMPECTOMY WITH NEEDLE LOCALIZATION Left 01/05/2018   Procedure: BREAST LUMPECTOMY WITH NEEDLE LOCALIZATION;  Surgeon: Olean Ree, MD;  Location: [facility] ORS;  Service: General;  Laterality: Left;  . BREAST SURGERY    . JOINT REPLACEMENT Right  10/2017   TKR  . MOHS SURGERY  2012  . OVARIAN CYST REMOVAL  1991  . RE-EXCISION OF BREAST LUMPECTOMY Left 01/12/2018   Procedure: RE-EXCISION OF BREAST LUMPECTOMY;  Surgeon: Olean Ree, MD;  Location: [facility] ORS;  Service: General;  Laterality: Left;  . SEPTOPLASTY  2009  . TOTAL KNEE 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   Procedure: RIGHT TOTAL KNEE ARTHROPLASTY;  Surgeon: Sydnee Cabal, MD;  Location: [facility];  Service: Orthopedics;  Laterality: Right;  2 hrs  . TUBAL LIGATION  1985  . tummy tuck  1995  . WISDOM TOOTH EXTRACTION     Family History  Problem Relation Age of Onset  . Cancer Mother 59       breast cancer  . Heart disease Mother   . Hypertension Mother   . Diabetes Mother   . Breast cancer Mother 28  . Heart disease Father   . Breast cancer Sister 25       on HRT   Social History   Socioeconomic History  . Marital status: Widowed    Spouse name: Iona Beard  . Number of children: 2  . Years of education: Not on file  . Highest education level: Not on file  Occupational History    Employer: [employer]  Tobacco Use  . Smoking status: Former Smoker    Packs/day: 0.50    Years: 10.00  Pack years: 5.00    Types: Cigarettes    Quit date: 03/12/2000    Years since quitting: 19.4  . Smokeless tobacco: Never Used  Vaping Use  . Vaping Use: Never used  Substance and Sexual Activity  . Alcohol use: Yes    Alcohol/week: 14.0 standard drinks    Types: 14 Glasses of wine per week    Comment: wine daily  . Drug use: No  . Sexual activity: Not Currently  Other Topics Concern  . Not on file  Social History Narrative  . Not on file   Social Determinants of Health   Financial Resource Strain:   . Difficulty of Paying Living Expenses:   Food Insecurity:   . Worried About Charity fundraiser in the Last Year:   . Arboriculturist in the Last Year:   Transportation Needs:   . Film/video editor (Medical):   Marland Kitchen Lack of Transportation (Non-Medical):   Physical Activity:    . Days of Exercise per Week:   . Minutes of Exercise per Session:   Stress:   . Feeling of Stress :   Social Connections:   . Frequency of Communication with Friends and Family:   . Frequency of Social Gatherings with Friends and Family:   . Attends Religious Services:   . Active Member of Clubs or Organizations:   . Attends Archivist Meetings:   Marland Kitchen Marital Status:     Outpatient Encounter Medications as of 08/01/2019  Medication Sig  . amLODipine (NORVASC) 5 MG tablet Take 1 tablet (5 mg total) by mouth daily.  Marland Kitchen anastrozole (ARIMIDEX) 1 MG tablet Take 1 tablet (1 mg total) by mouth daily.  . calcium elemental as carbonate (TUMS ULTRA 1000) 400 MG chewable tablet Chew 1,000 mg by mouth daily as needed for heartburn.   . enalapril (VASOTEC) 10 MG tablet TAKE 1 TABLET(10 MG) BY MOUTH TWICE DAILY  . FLUoxetine (PROZAC) 40 MG capsule Take 1 capsule (40 mg total) by mouth daily.  . Multiple Vitamins-Calcium (ONE-A-DAY WOMENS PO) Take 1 tablet by mouth daily.  . rosuvastatin (CRESTOR) 10 MG tablet Take 1 tablet (10 mg total) by mouth daily.  Marland Kitchen triamterene-hydrochlorothiazide (MAXZIDE-25) 37.5-25 MG tablet Take 1 tablet by mouth daily.  . [DISCONTINUED] ALPRAZolam (XANAX) 0.25 MG tablet Take 1 tablet (0.25 mg total) by mouth daily as needed. for anxiety  . [DISCONTINUED] amLODipine (NORVASC) 10 MG tablet Take 1 tablet (10 mg total) by mouth daily.  . [DISCONTINUED] cephALEXin (KEFLEX) 500 MG capsule Take 1 capsule (500 mg total) by mouth 2 (two) times daily.   No facility-administered encounter medications on file as of 08/01/2019.    Review of Systems  Constitutional: Negative for appetite change and unexpected weight change.  HENT: Negative for congestion and sinus pressure.   Respiratory: Negative for cough, chest tightness and shortness of breath.   Cardiovascular: Negative for chest pain and palpitations.       Feet swelling.    Gastrointestinal: Negative for abdominal  pain, diarrhea, nausea and vomiting.  Genitourinary: Negative for difficulty urinating and dysuria.  Musculoskeletal: Negative for joint swelling and myalgias.  Skin: Negative for color change and rash.  Neurological: Negative for dizziness, light-headedness and headaches.  Psychiatric/Behavioral: Negative for agitation and dysphoric mood.       Objective:    Physical Exam Vitals reviewed.  Constitutional:      General: She is not in acute distress.    Appearance: Normal appearance.  HENT:  Head: Normocephalic and atraumatic.     Right Ear: External ear normal.     Left Ear: External ear normal.  Eyes:     General:        Right eye: No discharge.        Left eye: No discharge.     Conjunctiva/sclera: Conjunctivae normal.  Neck:     Thyroid: No thyromegaly.  Cardiovascular:     Rate and Rhythm: Normal rate and regular rhythm.  Pulmonary:     Effort: No respiratory distress.     Breath sounds: Normal breath sounds. No wheezing.  Abdominal:     General: Bowel sounds are normal.     Palpations: Abdomen is soft.     Tenderness: There is no abdominal tenderness.  Musculoskeletal:        General: No swelling or tenderness.     Cervical back: Neck supple. No tenderness.  Lymphadenopathy:     Cervical: No cervical adenopathy.  Skin:    Findings: No erythema or rash.  Neurological:     Mental Status: She is alert.  Psychiatric:        Mood and Affect: Mood normal.        Behavior: Behavior normal.     BP 126/72   Pulse 94   Temp 98 F (36.7 C)   Resp 16   Ht _0  (1.727 m)   Wt 206 lb (93.4 kg)   SpO2 98%   BMI 31.32 kg/m  Wt Readings from Last 3 Encounters:  08/01/19 206 lb (93.4 kg)  04/24/19 207 lb 9.6 oz (94.2 kg)  03/06/19 202 lb 3.2 oz (91.7 kg)     Lab Results  Component Value Date   WBC 8.2 03/20/2019   HGB 15.2 (H) 03/20/2019   HCT 44.7 03/20/2019   PLT 353.0 03/20/2019   GLUCOSE 107 (H) 07/28/2019   CHOL 161 07/28/2019   TRIG 133.0  07/28/2019   HDL 56.90 07/28/2019   LDLDIRECT 138.0 03/20/2019   LDLCALC 78 07/28/2019   ALT 26 07/28/2019   AST 25 07/28/2019   NA 140 07/28/2019   K 3.9 07/28/2019   CL 104 07/28/2019   CREATININE 0.74 07/28/2019   BUN 22 07/28/2019   CO2 30 07/28/2019   TSH 1.64 04/15/2018   INR 0.94 08/13/2017   HGBA1C 5.4 03/20/2019       Assessment & Plan:   Problem List Items Addressed This Visit    Anxiety    Overall handling things relatively well.  On prozac.  Follow.        Atypical ductal hyperplasia of left breast    On arimidex.  12/2018 - mammogram ok.        Bilateral swelling of feet    Blood pressure looks good.  Will decrease amlodipine to 56m q day.  Follow pressures.  Follow metabolic panel.        Essential hypertension, benign    Blood pressure doing well on triam/hctz, amlodipine and enalapril.  Follow.        Relevant Medications   amLODipine (NORVASC) 5 MG tablet   History of melanoma    Followed by dermatology.        Hyperbilirubinemia - Primary    Elevated on recent check.  Follow liver panel.       Relevant Orders   Hepatic function panel   Hypercholesterolemia    Low cholesterol diet and exercise.  Follow lipid panel and liver function tests.  Relevant Medications   amLODipine (NORVASC) 5 MG tablet   Hyperglycemia    Low carb diet and exercise.  Follow met b and a1c.       Vitamin D deficiency    Follow vitamin D level.         Other Visit Diagnoses    Need for vaccination with 13-polyvalent pneumococcal conjugate vaccine       Relevant Orders   Pneumococcal conjugate vaccine 13-valent (Completed)       Einar Pheasant, MD

## 2019-08-01 NOTE — Telephone Encounter (Signed)
Patient would like a refill of Xanax. Last seen on 04/24/19. Medication last filled on 05/09/19

## 2019-08-02 DIAGNOSIS — L237 Allergic contact dermatitis due to plants, except food: Secondary | ICD-10-CM | POA: Diagnosis not present

## 2019-08-02 DIAGNOSIS — D225 Melanocytic nevi of trunk: Secondary | ICD-10-CM | POA: Diagnosis not present

## 2019-08-02 DIAGNOSIS — D2261 Melanocytic nevi of right upper limb, including shoulder: Secondary | ICD-10-CM | POA: Diagnosis not present

## 2019-08-02 DIAGNOSIS — D2272 Melanocytic nevi of left lower limb, including hip: Secondary | ICD-10-CM | POA: Diagnosis not present

## 2019-08-02 DIAGNOSIS — Z85828 Personal history of other malignant neoplasm of skin: Secondary | ICD-10-CM | POA: Diagnosis not present

## 2019-08-02 DIAGNOSIS — Z85820 Personal history of malignant melanoma of skin: Secondary | ICD-10-CM | POA: Diagnosis not present

## 2019-08-02 DIAGNOSIS — Z08 Encounter for follow-up examination after completed treatment for malignant neoplasm: Secondary | ICD-10-CM | POA: Diagnosis not present

## 2019-08-06 ENCOUNTER — Encounter: Payer: Self-pay | Admitting: Internal Medicine

## 2019-08-06 DIAGNOSIS — M7989 Other specified soft tissue disorders: Secondary | ICD-10-CM | POA: Insufficient documentation

## 2019-08-06 MED ORDER — AMLODIPINE BESYLATE 5 MG PO TABS
5.0000 mg | ORAL_TABLET | Freq: Every day | ORAL | 2 refills | Status: DC
Start: 2019-08-06 — End: 2019-11-16

## 2019-08-06 NOTE — Assessment & Plan Note (Signed)
Blood pressure looks good.  Will decrease amlodipine to 5mg  q day.  Follow pressures.  Follow metabolic panel.

## 2019-08-06 NOTE — Assessment & Plan Note (Signed)
Follow vitamin D level.  

## 2019-08-06 NOTE — Assessment & Plan Note (Signed)
Low carb diet and exercise.  Follow met b and a1c.  

## 2019-08-06 NOTE — Assessment & Plan Note (Signed)
On arimidex.  12/2018 - mammogram ok.

## 2019-08-06 NOTE — Assessment & Plan Note (Signed)
Overall handling things relatively well.  On prozac.  Follow.

## 2019-08-06 NOTE — Assessment & Plan Note (Signed)
Elevated on recent check.  Follow liver panel.

## 2019-08-06 NOTE — Assessment & Plan Note (Signed)
Blood pressure doing well on triam/hctz, amlodipine and enalapril.  Follow.

## 2019-08-06 NOTE — Assessment & Plan Note (Signed)
Low cholesterol diet and exercise.  Follow lipid panel and liver function tests.  

## 2019-08-06 NOTE — Assessment & Plan Note (Signed)
Followed by dermatology

## 2019-08-22 ENCOUNTER — Other Ambulatory Visit: Payer: Medicare PPO

## 2019-08-25 DIAGNOSIS — F4322 Adjustment disorder with anxiety: Secondary | ICD-10-CM | POA: Diagnosis not present

## 2019-08-29 ENCOUNTER — Encounter: Payer: Self-pay | Admitting: Internal Medicine

## 2019-09-04 NOTE — Telephone Encounter (Signed)
Patient is out of town with her high school friends. Stated that appt is not necessary right now but I did schedule her for a follow up on 8/2 after she returns to follow up on her blood pressures. She is going to monitor and will be evaluated if anything acute.

## 2019-09-04 NOTE — Telephone Encounter (Signed)
I do not mind working her in this pm, but I am not sure what time she is going out of town.  If unable to work in, then monitor blood pressures and schedule appt with me.  Will need to be seen if any acute issues.

## 2019-09-04 NOTE — Telephone Encounter (Signed)
Since she is going out of town and not having any acute symptoms, are you ok with her monitoring pressures and sending update when she comes back to town next week?

## 2019-09-07 DIAGNOSIS — F4322 Adjustment disorder with anxiety: Secondary | ICD-10-CM | POA: Diagnosis not present

## 2019-09-11 ENCOUNTER — Ambulatory Visit: Payer: Medicare PPO | Admitting: Internal Medicine

## 2019-09-11 ENCOUNTER — Encounter: Payer: Self-pay | Admitting: Internal Medicine

## 2019-09-11 ENCOUNTER — Other Ambulatory Visit: Payer: Self-pay

## 2019-09-11 VITALS — BP 118/70 | HR 94 | Temp 98.5°F | Resp 16 | Wt 209.0 lb

## 2019-09-11 DIAGNOSIS — N6092 Unspecified benign mammary dysplasia of left breast: Secondary | ICD-10-CM

## 2019-09-11 DIAGNOSIS — Z85820 Personal history of malignant melanoma of skin: Secondary | ICD-10-CM | POA: Diagnosis not present

## 2019-09-11 DIAGNOSIS — Z8371 Family history of colonic polyps: Secondary | ICD-10-CM

## 2019-09-11 DIAGNOSIS — Z1211 Encounter for screening for malignant neoplasm of colon: Secondary | ICD-10-CM

## 2019-09-11 DIAGNOSIS — F419 Anxiety disorder, unspecified: Secondary | ICD-10-CM

## 2019-09-11 DIAGNOSIS — Z803 Family history of malignant neoplasm of breast: Secondary | ICD-10-CM

## 2019-09-11 DIAGNOSIS — E7800 Pure hypercholesterolemia, unspecified: Secondary | ICD-10-CM

## 2019-09-11 DIAGNOSIS — R945 Abnormal results of liver function studies: Secondary | ICD-10-CM | POA: Diagnosis not present

## 2019-09-11 DIAGNOSIS — I10 Essential (primary) hypertension: Secondary | ICD-10-CM

## 2019-09-11 DIAGNOSIS — R739 Hyperglycemia, unspecified: Secondary | ICD-10-CM | POA: Diagnosis not present

## 2019-09-11 DIAGNOSIS — R7989 Other specified abnormal findings of blood chemistry: Secondary | ICD-10-CM

## 2019-09-11 NOTE — Progress Notes (Signed)
Patient ID: Jordan Benson, female   DOB: 05/07/1955, 64 y.o.   MRN: [id number]   Subjective:    Patient ID: Jordan Benson, female    DOB: May 03, 1955, 64 y.o.   MRN: [id number]  HPI This visit occurred during the SARS-CoV-2 public health emergency.  Safety protocols were in place, including screening questions prior to the visit, additional usage of staff PPE, and extensive cleaning of exam room while observing appropriate contact time as indicated for disinfecting solutions.  Patient here for a work in appt to discuss her blood pressure.  Has been elevated on checks at home.  States her blood pressure has been averaging 140-150s/80s.  No headache or dizziness reported.  No chest pain or sob reported.  No nausea or vomiting.  Blood pressure here better.  Handling stress. Discussed due colonoscopy.  Also discussed family history - mother and sister with breast cancer.  Discussed genetic testing.     Past Medical History:  Diagnosis Date  . Anxiety   . Breast cancer ([place]) 12/2017  . Complication of anesthesia   . GERD (gastroesophageal reflux disease)   . Headache    optical migraines  . Hypertension   . IBS (irritable bowel syndrome)   . Melanoma ([place]) 2019   followed by Dr Evorn Gong, on left arm  . PONV (postoperative nausea and vomiting)   . Vitamin D deficiency    Past Surgical History:  Procedure Laterality Date  . ABDOMINAL HYSTERECTOMY  1992  . BREAST BIOPSY Left 12/21/2017   affirm bx of asymmetry, ATYPICAL DUCTAL PAPILLARY LESION WITH ADJACENT ATYPICAL DUCTAL HYPERPLASIA.   Marland Kitchen BREAST BIOPSY Left 12/21/2017   [place] bx of LN, benign  . BREAST LUMPECTOMY Left 01/05/2018   ADH  . BREAST LUMPECTOMY Left 01/12/2018   neg  . BREAST LUMPECTOMY WITH NEEDLE LOCALIZATION Left 01/05/2018   Procedure: BREAST LUMPECTOMY WITH NEEDLE LOCALIZATION;  Surgeon: Olean Ree, MD;  Location: [facility] ORS;  Service: General;  Laterality: Left;  . BREAST SURGERY    . JOINT REPLACEMENT Right  10/2017   TKR  . MOHS SURGERY  2012  . OVARIAN CYST REMOVAL  1991  . RE-EXCISION OF BREAST LUMPECTOMY Left 01/12/2018   Procedure: RE-EXCISION OF BREAST LUMPECTOMY;  Surgeon: Olean Ree, MD;  Location: [facility] ORS;  Service: General;  Laterality: Left;  . SEPTOPLASTY  2009  . TOTAL KNEE 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   Procedure: RIGHT TOTAL KNEE ARTHROPLASTY;  Surgeon: Sydnee Cabal, MD;  Location: [facility];  Service: Orthopedics;  Laterality: Right;  2 hrs  . TUBAL LIGATION  1985  . tummy tuck  1995  . WISDOM TOOTH EXTRACTION     Family History  Problem Relation Age of Onset  . Cancer Mother 24       breast cancer  . Heart disease Mother   . Hypertension Mother   . Diabetes Mother   . Breast cancer Mother 61  . Heart disease Father   . Breast cancer Sister 47       on HRT   Social History   Socioeconomic History  . Marital status: Widowed    Spouse name: Iona Beard  . Number of children: 2  . Years of education: Not on file  . Highest education level: Not on file  Occupational History    Employer: [employer]  Tobacco Use  . Smoking status: Former Smoker    Packs/day: 0.50    Years: 10.00    Pack years: 5.00    Types: Cigarettes  Quit date: 03/12/2000    Years since quitting: 19.5  . Smokeless tobacco: Never Used  Vaping Use  . Vaping Use: Never used  Substance and Sexual Activity  . Alcohol use: Yes    Alcohol/week: 14.0 standard drinks    Types: 14 Glasses of wine per week    Comment: wine daily  . Drug use: No  . Sexual activity: Not Currently  Other Topics Concern  . Not on file  Social History Narrative  . Not on file   Social Determinants of Health   Financial Resource Strain:   . Difficulty of Paying Living Expenses:   Food Insecurity:   . Worried About Charity fundraiser in the Last Year:   . Arboriculturist in the Last Year:   Transportation Needs:   . Film/video editor (Medical):   Marland Kitchen Lack of Transportation (Non-Medical):   Physical Activity:    . Days of Exercise per Week:   . Minutes of Exercise per Session:   Stress:   . Feeling of Stress :   Social Connections:   . Frequency of Communication with Friends and Family:   . Frequency of Social Gatherings with Friends and Family:   . Attends Religious Services:   . Active Member of Clubs or Organizations:   . Attends Archivist Meetings:   Marland Kitchen Marital Status:     Outpatient Encounter Medications as of 09/11/2019  Medication Sig  . ALPRAZolam (XANAX) 0.25 MG tablet Take 1 tablet (0.25 mg total) by mouth daily as needed. for anxiety  . amLODipine (NORVASC) 5 MG tablet Take 1 tablet (5 mg total) by mouth daily.  Marland Kitchen anastrozole (ARIMIDEX) 1 MG tablet Take 1 tablet (1 mg total) by mouth daily.  . calcium elemental as carbonate (TUMS ULTRA 1000) 400 MG chewable tablet Chew 1,000 mg by mouth daily as needed for heartburn.   . enalapril (VASOTEC) 10 MG tablet TAKE 1 TABLET(10 MG) BY MOUTH TWICE DAILY  . FLUoxetine (PROZAC) 40 MG capsule Take 1 capsule (40 mg total) by mouth daily.  . Multiple Vitamins-Calcium (ONE-A-DAY WOMENS PO) Take 1 tablet by mouth daily.  . rosuvastatin (CRESTOR) 10 MG tablet Take 1 tablet (10 mg total) by mouth daily.  Marland Kitchen triamterene-hydrochlorothiazide (MAXZIDE-25) 37.5-25 MG tablet Take 1 tablet by mouth daily.   No facility-administered encounter medications on file as of 09/11/2019.    Review of Systems  Constitutional: Negative for appetite change and unexpected weight change.  HENT: Negative for congestion and sinus pressure.   Respiratory: Negative for cough, chest tightness and shortness of breath.   Cardiovascular: Negative for chest pain, palpitations and leg swelling.  Gastrointestinal: Negative for abdominal pain, diarrhea, nausea and vomiting.  Genitourinary: Negative for difficulty urinating and dysuria.  Musculoskeletal: Negative for joint swelling and myalgias.  Skin: Negative for color change and rash.  Neurological: Negative for  dizziness, light-headedness and headaches.  Psychiatric/Behavioral: Negative for agitation and dysphoric mood.       Objective:    Physical Exam Vitals reviewed.  Constitutional:      General: She is not in acute distress.    Appearance: Normal appearance.  HENT:     Head: Normocephalic and atraumatic.     Right Ear: External ear normal.     Left Ear: External ear normal.  Eyes:     General: No scleral icterus.       Right eye: No discharge.        Left eye: No discharge.  Conjunctiva/sclera: Conjunctivae normal.  Neck:     Thyroid: No thyromegaly.  Cardiovascular:     Rate and Rhythm: Normal rate and regular rhythm.  Pulmonary:     Effort: No respiratory distress.     Breath sounds: Normal breath sounds. No wheezing.  Abdominal:     General: Bowel sounds are normal.     Palpations: Abdomen is soft.     Tenderness: There is no abdominal tenderness.  Musculoskeletal:        General: No swelling or tenderness.     Cervical back: Neck supple. No tenderness.  Lymphadenopathy:     Cervical: No cervical adenopathy.  Skin:    Findings: No erythema or rash.  Neurological:     Mental Status: She is alert.  Psychiatric:        Mood and Affect: Mood normal.        Behavior: Behavior normal.     BP 118/70   Pulse 94   Temp 98.5 F (36.9 C) (Oral)   Resp 16   Wt 209 lb (94.8 kg)   SpO2 97%   BMI 31.78 kg/m  Wt Readings from Last 3 Encounters:  09/11/19 209 lb (94.8 kg)  08/01/19 206 lb (93.4 kg)  04/24/19 207 lb 9.6 oz (94.2 kg)     Lab Results  Component Value Date   WBC 8.2 03/20/2019   HGB 15.2 (H) 03/20/2019   HCT 44.7 03/20/2019   PLT 353.0 03/20/2019   GLUCOSE 107 (H) 07/28/2019   CHOL 161 07/28/2019   TRIG 133.0 07/28/2019   HDL 56.90 07/28/2019   LDLDIRECT 138.0 03/20/2019   LDLCALC 78 07/28/2019   ALT 26 07/28/2019   AST 25 07/28/2019   NA 140 07/28/2019   K 3.9 07/28/2019   CL 104 07/28/2019   CREATININE 0.74 07/28/2019   BUN 22  07/28/2019   CO2 30 07/28/2019   TSH 1.64 04/15/2018   INR 0.94 08/13/2017   HGBA1C 5.4 03/20/2019       Assessment & Plan:   Problem List Items Addressed This Visit    Hyperglycemia    Low carb diet and exercise.  Follow met b and a1c.        Hypercholesterolemia    Low cholesterol diet and exercise.  Follow lipid panel.        History of melanoma    Followed by dermatology.       Family history of colonic polyps    Colonoscopy 03/2014.  Recommended f/u in 5 years.  Overdue.  Refer to GI for screening colonoscopy.        Relevant Orders   Ambulatory referral to Gastroenterology   Family history of breast cancer    Her mother and sister with breast cancer.  Sees oncology.  On arimidex.  Discussed genetic testing.  Will d/w oncology.        Essential hypertension, benign    Blood pressure as outlined here on triam/hctz, enalapril and amlodipine.  Follow pressures.  Follow metabolic panel.       Atypical ductal hyperplasia of left breast    On arimidex.  12/21/18 - mammogram ok.        Anxiety    On prozac.  Overall appears to be doing well.  Follow.        Abnormal liver function test    Continue diet, exercise and decreased alcohol intake.  Follow liver function tests.        Other Visit Diagnoses    Colon  cancer screening    -  Primary   Relevant Orders   Ambulatory referral to Gastroenterology       Einar Pheasant, MD

## 2019-09-24 ENCOUNTER — Encounter: Payer: Self-pay | Admitting: Internal Medicine

## 2019-09-24 DIAGNOSIS — Z803 Family history of malignant neoplasm of breast: Secondary | ICD-10-CM | POA: Insufficient documentation

## 2019-09-24 NOTE — Assessment & Plan Note (Signed)
Continue diet, exercise and decreased alcohol intake.  Follow liver function tests.

## 2019-09-24 NOTE — Assessment & Plan Note (Signed)
Blood pressure as outlined here on triam/hctz, enalapril and amlodipine.  Follow pressures.  Follow metabolic panel.

## 2019-09-24 NOTE — Assessment & Plan Note (Signed)
On arimidex.  12/21/18 - mammogram ok.

## 2019-09-24 NOTE — Assessment & Plan Note (Signed)
Low carb diet and exercise.  Follow met b and a1c.   

## 2019-09-24 NOTE — Assessment & Plan Note (Signed)
Her mother and sister with breast cancer.  Sees oncology.  On arimidex.  Discussed genetic testing.  Will d/w oncology.

## 2019-09-24 NOTE — Assessment & Plan Note (Signed)
Colonoscopy 03/2014.  Recommended f/u in 5 years.  Overdue.  Refer to GI for screening colonoscopy.

## 2019-09-24 NOTE — Assessment & Plan Note (Signed)
On prozac.  Overall appears to be doing well.  Follow.

## 2019-09-24 NOTE — Assessment & Plan Note (Signed)
Low cholesterol diet and exercise.  Follow lipid panel.   

## 2019-09-24 NOTE — Assessment & Plan Note (Signed)
Followed by dermatology

## 2019-09-25 ENCOUNTER — Telehealth: Payer: Self-pay | Admitting: Gastroenterology

## 2019-09-25 NOTE — Telephone Encounter (Signed)
Patient is being referred for a colonoscopy, last colonoscopy was in 2016 at [facility]. Sending records to get reviewed, by DOD. patient aware.

## 2019-09-28 ENCOUNTER — Encounter: Payer: Self-pay | Admitting: Gastroenterology

## 2019-09-29 DIAGNOSIS — F4322 Adjustment disorder with anxiety: Secondary | ICD-10-CM | POA: Diagnosis not present

## 2019-10-06 ENCOUNTER — Other Ambulatory Visit: Payer: Self-pay | Admitting: Internal Medicine

## 2019-10-07 ENCOUNTER — Other Ambulatory Visit: Payer: Self-pay | Admitting: Internal Medicine

## 2019-10-19 DIAGNOSIS — F4322 Adjustment disorder with anxiety: Secondary | ICD-10-CM | POA: Diagnosis not present

## 2019-10-25 ENCOUNTER — Other Ambulatory Visit: Payer: Self-pay | Admitting: Internal Medicine

## 2019-11-01 DIAGNOSIS — F4322 Adjustment disorder with anxiety: Secondary | ICD-10-CM | POA: Diagnosis not present

## 2019-11-16 ENCOUNTER — Other Ambulatory Visit: Payer: Self-pay

## 2019-11-16 ENCOUNTER — Encounter: Payer: Self-pay | Admitting: Gastroenterology

## 2019-11-16 ENCOUNTER — Ambulatory Visit (AMBULATORY_SURGERY_CENTER): Payer: Self-pay | Admitting: *Deleted

## 2019-11-16 VITALS — Ht 68.0 in | Wt 205.0 lb

## 2019-11-16 DIAGNOSIS — Z8371 Family history of colonic polyps: Secondary | ICD-10-CM

## 2019-11-16 MED ORDER — NA SULFATE-K SULFATE-MG SULF 17.5-3.13-1.6 GM/177ML PO SOLN: 1.0000 | Freq: Once | ORAL | 0 refills | Status: AC

## 2019-11-16 NOTE — Progress Notes (Signed)
Patient's pre-visit was done today over the phone with the patient due to COVID-19 pandemic. Name,DOB and address verified. Insurance verified. Packet of Prep instructions mailed to patient including copy of a consent form and pre-procedure patient acknowledgement form-pt is aware.  Patient understands to call us back with any questions or concerns. COVID-19 vaccines completed per pt. Pt is aware that care partner will wait in the car during procedure; if they feel like they will be too hot or cold to wait in the car; they may wait in the 4 th floor lobby. Patient is aware to bring only one care partner. We want them to wear a mask (we do not have any that we can provide them), practice social distancing, and we will check their temperatures when they get here.  I did remind the patient that their care partner needs to stay in the parking lot the entire time and have a cell phone available, we will call them when the pt is ready for discharge. Patient will wear mask into building.

## 2019-11-17 ENCOUNTER — Other Ambulatory Visit: Payer: Self-pay

## 2019-11-17 ENCOUNTER — Encounter: Payer: Self-pay | Admitting: Internal Medicine

## 2019-11-17 MED ORDER — ENALAPRIL MALEATE 10 MG PO TABS: ORAL_TABLET | ORAL | 2 refills | Status: DC

## 2019-11-27 ENCOUNTER — Other Ambulatory Visit: Payer: Self-pay

## 2019-11-27 ENCOUNTER — Other Ambulatory Visit (INDEPENDENT_AMBULATORY_CARE_PROVIDER_SITE_OTHER): Payer: Medicare PPO

## 2019-11-27 ENCOUNTER — Other Ambulatory Visit: Payer: Self-pay | Admitting: Internal Medicine

## 2019-11-27 DIAGNOSIS — E7800 Pure hypercholesterolemia, unspecified: Secondary | ICD-10-CM | POA: Diagnosis not present

## 2019-11-27 LAB — HEPATIC FUNCTION PANEL
ALT: 22 U/L (ref 0–35)
AST: 24 U/L (ref 0–37)
Albumin: 4.2 g/dL (ref 3.5–5.2)
Alkaline Phosphatase: 73 U/L (ref 39–117)
Bilirubin, Direct: 0.2 mg/dL (ref 0.0–0.3)
Total Bilirubin: 1.2 mg/dL (ref 0.2–1.2)
Total Protein: 7.1 g/dL (ref 6.0–8.3)

## 2019-11-27 LAB — BASIC METABOLIC PANEL
BUN: 17 mg/dL (ref 6–23)
CO2: 28 mEq/L (ref 19–32)
Calcium: 9.5 mg/dL (ref 8.4–10.5)
Chloride: 101 mEq/L (ref 96–112)
Creatinine, Ser: 0.75 mg/dL (ref 0.40–1.20)
GFR: 83.12 mL/min (ref >60.00)
Glucose, Bld: 98 mg/dL (ref 70–99)
Potassium: 3.7 mEq/L (ref 3.5–5.1)
Sodium: 141 mEq/L (ref 135–145)

## 2019-11-27 LAB — LIPID PANEL
Cholesterol: 153 mg/dL (ref 0–200)
HDL: 54.7 mg/dL (ref >39.00)
LDL Cholesterol: 66 mg/dL (ref 0–99)
NonHDL: 98.5
Total CHOL/HDL Ratio: 3
Triglycerides: 162 mg/dL — ABNORMAL HIGH (ref 0.0–149.0)
VLDL: 32.4 mg/dL (ref 0.0–40.0)

## 2019-11-27 NOTE — Progress Notes (Signed)
Order placed for add on labs.   °

## 2019-12-01 ENCOUNTER — Other Ambulatory Visit: Payer: Self-pay

## 2019-12-01 ENCOUNTER — Encounter: Payer: Self-pay | Admitting: Internal Medicine

## 2019-12-01 ENCOUNTER — Ambulatory Visit (INDEPENDENT_AMBULATORY_CARE_PROVIDER_SITE_OTHER): Payer: Medicare PPO | Admitting: Internal Medicine

## 2019-12-01 VITALS — BP 118/72 | HR 93 | Temp 98.6°F | Ht 67.99 in | Wt 211.2 lb

## 2019-12-01 DIAGNOSIS — I10 Essential (primary) hypertension: Secondary | ICD-10-CM

## 2019-12-01 DIAGNOSIS — Z0000 Encounter for general adult medical examination without abnormal findings: Secondary | ICD-10-CM

## 2019-12-01 DIAGNOSIS — R739 Hyperglycemia, unspecified: Secondary | ICD-10-CM | POA: Diagnosis not present

## 2019-12-01 DIAGNOSIS — R7989 Other specified abnormal findings of blood chemistry: Secondary | ICD-10-CM

## 2019-12-01 DIAGNOSIS — E7800 Pure hypercholesterolemia, unspecified: Secondary | ICD-10-CM | POA: Diagnosis not present

## 2019-12-01 DIAGNOSIS — R945 Abnormal results of liver function studies: Secondary | ICD-10-CM | POA: Diagnosis not present

## 2019-12-01 DIAGNOSIS — Z1231 Encounter for screening mammogram for malignant neoplasm of breast: Secondary | ICD-10-CM | POA: Diagnosis not present

## 2019-12-01 DIAGNOSIS — N6092 Unspecified benign mammary dysplasia of left breast: Secondary | ICD-10-CM | POA: Diagnosis not present

## 2019-12-01 NOTE — Progress Notes (Signed)
Patient ID: Jordan Benson, female   DOB: 31-May-1955, 64 y.o.   MRN: [id number]   Subjective:    Patient ID: Jordan Benson, female    DOB: 1955-05-19, 64 y.o.   MRN: [id number]  HPI This visit occurred during the SARS-CoV-2 public health emergency.  Safety protocols were in place, including screening questions prior to the visit, additional usage of staff PPE, and extensive cleaning of exam room while observing appropriate contact time as indicated for disinfecting solutions.  Patient here for her physical exam. She is doing well.  Handling stress.  Doing better.  Doing some traveling.  Staying active.  No chest pain or sob.  No acid reflux. No abdominal pain.  Bowels moving.  Has noticed some ankle swelling.  Increased driving lately.  Swelling resolved in am.  Worse as day progresses.  Blood pressure doing well.  Planning for colonoscopy 12/04/19.   Past Medical History:  Diagnosis Date  . Anxiety   . Breast cancer ([place]) 12/2017   hyperplasia only not cancer per pt  . Complication of anesthesia    nausea  . GERD (gastroesophageal reflux disease)   . Headache    optical migraines  . Hyperlipidemia   . Hypertension   . IBS (irritable bowel syndrome)   . Melanoma ([place]) 2019   followed by Dr Evorn Gong, on left arm  . PONV (postoperative nausea and vomiting)   . Vitamin D deficiency    Past Surgical History:  Procedure Laterality Date  . ABDOMINAL HYSTERECTOMY  1992  . BREAST BIOPSY Left 12/21/2017   affirm bx of asymmetry, ATYPICAL DUCTAL PAPILLARY LESION WITH ADJACENT ATYPICAL DUCTAL HYPERPLASIA.   Marland Kitchen BREAST BIOPSY Left 12/21/2017   [place] bx of LN, benign  . BREAST LUMPECTOMY Left 01/05/2018   ADH  . BREAST LUMPECTOMY Left 01/12/2018   neg  . BREAST LUMPECTOMY WITH NEEDLE LOCALIZATION Left 01/05/2018   Procedure: BREAST LUMPECTOMY WITH NEEDLE LOCALIZATION;  Surgeon: Olean Ree, MD;  Location: [facility] ORS;  Service: General;  Laterality: Left;  . BREAST SURGERY    .  COLONOSCOPY  2001, 2016   x2 ARM and at [place]  . JOINT REPLACEMENT Right 10/2017   TKR  . MOHS SURGERY  2012  . OVARIAN CYST REMOVAL  1991  . POLYPECTOMY    . RE-EXCISION OF BREAST LUMPECTOMY Left 01/12/2018   Procedure: RE-EXCISION OF BREAST LUMPECTOMY;  Surgeon: Olean Ree, MD;  Location: [facility] ORS;  Service: General;  Laterality: Left;  . SEPTOPLASTY  2009  . TOTAL KNEE 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   Procedure: RIGHT TOTAL KNEE ARTHROPLASTY;  Surgeon: Sydnee Cabal, MD;  Location: [facility];  Service: Orthopedics;  Laterality: Right;  2 hrs  . TUBAL LIGATION  1985  . tummy tuck  1995  . WISDOM TOOTH EXTRACTION     Family History  Problem Relation Age of Onset  . Cancer Mother 13       breast cancer  . Heart disease Mother   . Hypertension Mother   . Diabetes Mother   . Breast cancer Mother 11  . Heart disease Father   . Breast cancer Sister 50       on HRT  . Colon polyps Sister   . Colon cancer Neg Hx   . Esophageal cancer Neg Hx   . Rectal cancer Neg Hx   . Stomach cancer Neg Hx    Social History   Socioeconomic History  . Marital status: Widowed    Spouse name: Iona Beard  .  Number of children: 2  . Years of education: Not on file  . Highest education level: Not on file  Occupational History    Employer: [employer]  Tobacco Use  . Smoking status: Former Smoker    Packs/day: 0.50    Years: 10.00    Pack years: 5.00    Types: Cigarettes    Quit date: 03/12/2000    Years since quitting: 19.7  . Smokeless tobacco: Never Used  Vaping Use  . Vaping Use: Never used  Substance and Sexual Activity  . Alcohol use: Yes    Alcohol/week: 14.0 standard drinks    Types: 14 Glasses of wine per week    Comment: wine daily  . Drug use: Yes    Comment: gummies  . Sexual activity: Not Currently  Other Topics Concern  . Not on file  Social History Narrative  . Not on file   Social Determinants of Health   Financial Resource Strain:   . Difficulty of Paying Living Expenses:  Not on file  Food Insecurity:   . Worried About Charity fundraiser in the Last Year: Not on file  . Ran Out of Food in the Last Year: Not on file  Transportation Needs:   . Lack of Transportation (Medical): Not on file  . Lack of Transportation (Non-Medical): Not on file  Physical Activity:   . Days of Exercise per Week: Not on file  . Minutes of Exercise per Session: Not on file  Stress:   . Feeling of Stress : Not on file  Social Connections:   . Frequency of Communication with Friends and Family: Not on file  . Frequency of Social Gatherings with Friends and Family: Not on file  . Attends Religious Services: Not on file  . Active Member of Clubs or Organizations: Not on file  . Attends Archivist Meetings: Not on file  . Marital Status: Not on file    Outpatient Encounter Medications as of 12/01/2019  Medication Sig  . ALPRAZolam (XANAX) 0.25 MG tablet Take 1 tablet (0.25 mg total) by mouth daily as needed. for anxiety  . amLODipine (NORVASC) 10 MG tablet TAKE 1 TABLET EVERY DAY  . calcium elemental as carbonate (TUMS ULTRA 1000) 400 MG chewable tablet Chew 1,000 mg by mouth daily as needed for heartburn.   . enalapril (VASOTEC) 10 MG tablet TAKE 1 TABLET(10 MG) BY MOUTH TWICE DAILY  . exemestane (AROMASIN) 25 MG tablet Take 25 mg by mouth daily after breakfast.  . FLUoxetine (PROZAC) 40 MG capsule TAKE 1 CAPSULE EVERY DAY  . Multiple Vitamins-Calcium (ONE-A-DAY WOMENS PO) Take 1 tablet by mouth daily.  . rosuvastatin (CRESTOR) 10 MG tablet TAKE 1 TABLET EVERY DAY  . triamterene-hydrochlorothiazide (MAXZIDE-25) 37.5-25 MG tablet TAKE 1 TABLET EVERY DAY   No facility-administered encounter medications on file as of 12/01/2019.    Review of Systems  Constitutional: Negative for appetite change and unexpected weight change.  HENT: Negative for congestion, sinus pressure and sore throat.   Eyes: Negative for pain and visual disturbance.  Respiratory: Negative for  cough, chest tightness and shortness of breath.   Cardiovascular: Negative for chest pain and palpitations.       Swelling in ankles as outlined.   Gastrointestinal: Negative for abdominal pain, diarrhea, nausea and vomiting.  Genitourinary: Negative for difficulty urinating and dysuria.  Musculoskeletal: Negative for back pain and joint swelling.  Skin: Negative for color change and rash.  Neurological: Negative for dizziness, light-headedness and headaches.  Hematological: Negative for adenopathy. Does not bruise/bleed easily.  Psychiatric/Behavioral: Negative for agitation and dysphoric mood.       Objective:    Physical Exam Vitals reviewed.  Constitutional:      General: She is not in acute distress.    Appearance: Normal appearance. She is well-developed.  HENT:     Head: Normocephalic and atraumatic.     Right Ear: External ear normal.     Left Ear: External ear normal.  Eyes:     General: No scleral icterus.       Right eye: No discharge.        Left eye: No discharge.     Conjunctiva/sclera: Conjunctivae normal.  Neck:     Thyroid: No thyromegaly.  Cardiovascular:     Rate and Rhythm: Normal rate and regular rhythm.  Pulmonary:     Effort: No tachypnea, accessory muscle usage or respiratory distress.     Breath sounds: Normal breath sounds. No decreased breath sounds or wheezing.  Chest:     Breasts:        Right: No inverted nipple, mass, nipple discharge or tenderness (no axillary adenopathy).        Left: No inverted nipple, mass, nipple discharge or tenderness (no axilarry adenopathy).  Abdominal:     General: Bowel sounds are normal.     Palpations: Abdomen is soft.     Tenderness: There is no abdominal tenderness.  Musculoskeletal:        General: No swelling or tenderness.     Cervical back: Neck supple. No tenderness.  Lymphadenopathy:     Cervical: No cervical adenopathy.  Skin:    Findings: No erythema or rash.  Neurological:     Mental Status:  She is alert and oriented to person, place, and time.  Psychiatric:        Mood and Affect: Mood normal.        Behavior: Behavior normal.     BP 118/72   Pulse 93   Temp 98.6 F (37 C)   Ht 5' 7.99" (1.727 m)   Wt 211 lb 3.2 oz (95.8 kg)   SpO2 97%   BMI 32.12 kg/m  Wt Readings from Last 3 Encounters:  12/01/19 211 lb 3.2 oz (95.8 kg)  11/16/19 205 lb (93 kg)  09/11/19 209 lb (94.8 kg)     Lab Results  Component Value Date   WBC 8.2 03/20/2019   HGB 15.2 (H) 03/20/2019   HCT 44.7 03/20/2019   PLT 353.0 03/20/2019   GLUCOSE 98 11/27/2019   CHOL 153 11/27/2019   TRIG 162.0 (H) 11/27/2019   HDL 54.70 11/27/2019   LDLDIRECT 138.0 03/20/2019   LDLCALC 66 11/27/2019   ALT 22 11/27/2019   AST 24 11/27/2019   NA 141 11/27/2019   K 3.7 11/27/2019   CL 101 11/27/2019   CREATININE 0.75 11/27/2019   BUN 17 11/27/2019   CO2 28 11/27/2019   TSH 1.64 04/15/2018   INR 0.94 08/13/2017   HGBA1C 5.4 03/20/2019       Assessment & Plan:   Problem List Items Addressed This Visit    Hyperglycemia    Low carb diet and exercise. Follow met b and a1c.   Lab Results  Component Value Date   HGBA1C 5.4 03/20/2019        Relevant Orders   Hemoglobin A1c   Hypercholesterolemia    Low cholesterol diet and exercise.  Follow lipid panel and liver function tests.  On  crestor.   Lab Results  Component Value Date   CHOL 153 11/27/2019   HDL 54.70 11/27/2019   LDLCALC 66 11/27/2019   LDLDIRECT 138.0 03/20/2019   TRIG 162.0 (H) 11/27/2019   CHOLHDL 3 11/27/2019        Relevant Orders   Hepatic function panel   Lipid panel   Health care maintenance    Physical today 12/01/19.  Mammogram 12/27/18 - birads I.  colonoscopy 03/2014.  Scheduled for f/u colonoscopy 12/04/19.        Essential hypertension, benign    Continues on enalapril, triam/hctz and amlodipine.  Blood pressure doing well.  Recheck by me:  118/72.  Continue current medication regimen.  Follow pressures.   Follow metabolic panel.       Relevant Orders   CBC with Differential/Platelet   TSH   Basic metabolic panel   Atypical ductal hyperplasia of left breast    On arimidex.  Mammogram 12/2018 - Birads I.  Continue f/u with oncology.       Relevant Orders   MM 3D SCREEN BREAST BILATERAL   Abnormal liver function test    Continue diet and exercise.  Discussed need for continued decreased alcohol intake.  Recent liver panel wnl.  Follow.        Other Visit Diagnoses    Encounter for screening mammogram for malignant neoplasm of breast    -  Primary   Relevant Orders   MM 3D SCREEN BREAST BILATERAL       Einar Pheasant, MD

## 2019-12-03 ENCOUNTER — Encounter: Payer: Self-pay | Admitting: Internal Medicine

## 2019-12-03 NOTE — Assessment & Plan Note (Signed)
Continue diet and exercise.  Discussed need for continued decreased alcohol intake.  Recent liver panel wnl.  Follow.

## 2019-12-03 NOTE — Assessment & Plan Note (Signed)
On arimidex.  Mammogram 12/2018 - Birads I.  Continue f/u with oncology.

## 2019-12-03 NOTE — Assessment & Plan Note (Addendum)
Physical today 12/01/19.  Mammogram 12/27/18 - birads I.  colonoscopy 03/2014.  Scheduled for f/u colonoscopy 12/04/19.

## 2019-12-03 NOTE — Assessment & Plan Note (Signed)
Continues on enalapril, triam/hctz and amlodipine.  Blood pressure doing well.  Recheck by me:  118/72.  Continue current medication regimen.  Follow pressures.  Follow metabolic panel.

## 2019-12-03 NOTE — Assessment & Plan Note (Signed)
Low cholesterol diet and exercise.  Follow lipid panel and liver function tests.  On crestor.   Lab Results  Component Value Date   CHOL 153 11/27/2019   HDL 54.70 11/27/2019   LDLCALC 66 11/27/2019   LDLDIRECT 138.0 03/20/2019   TRIG 162.0 (H) 11/27/2019   CHOLHDL 3 11/27/2019

## 2019-12-03 NOTE — Assessment & Plan Note (Signed)
Low carb diet and exercise. Follow met b and a1c.   Lab Results  Component Value Date   HGBA1C 5.4 03/20/2019

## 2019-12-04 ENCOUNTER — Ambulatory Visit (AMBULATORY_SURGERY_CENTER): Payer: Medicare PPO | Admitting: Gastroenterology

## 2019-12-04 ENCOUNTER — Encounter: Payer: Self-pay | Admitting: Gastroenterology

## 2019-12-04 ENCOUNTER — Other Ambulatory Visit: Payer: Self-pay

## 2019-12-04 VITALS — BP 127/71 | HR 75 | Temp 97.8°F | Resp 13 | Ht 68.0 in | Wt 205.0 lb

## 2019-12-04 DIAGNOSIS — D123 Benign neoplasm of transverse colon: Secondary | ICD-10-CM

## 2019-12-04 DIAGNOSIS — D125 Benign neoplasm of sigmoid colon: Secondary | ICD-10-CM

## 2019-12-04 DIAGNOSIS — D122 Benign neoplasm of ascending colon: Secondary | ICD-10-CM

## 2019-12-04 DIAGNOSIS — E785 Hyperlipidemia, unspecified: Secondary | ICD-10-CM | POA: Diagnosis not present

## 2019-12-04 DIAGNOSIS — Z1211 Encounter for screening for malignant neoplasm of colon: Secondary | ICD-10-CM | POA: Diagnosis not present

## 2019-12-04 DIAGNOSIS — Z8371 Family history of colonic polyps: Secondary | ICD-10-CM | POA: Diagnosis not present

## 2019-12-04 DIAGNOSIS — D126 Benign neoplasm of colon, unspecified: Secondary | ICD-10-CM

## 2019-12-04 DIAGNOSIS — K635 Polyp of colon: Secondary | ICD-10-CM | POA: Diagnosis not present

## 2019-12-04 DIAGNOSIS — I10 Essential (primary) hypertension: Secondary | ICD-10-CM | POA: Diagnosis not present

## 2019-12-04 IMAGING — MG MM BREAST SURGICAL SPECIMEN
2 series · 2 of 2 positions shown · non-contrast
Comparison: Previous exams.

CLINICAL DATA: 62-year-old female with biopsy proven left breast
atypical ductal papillary lesion.

EXAM:
NEEDLE LOCALIZATION OF THE LEFT BREAST WITH MAMMO GUIDANCE

[L SPECIMEN (1 of 2)]
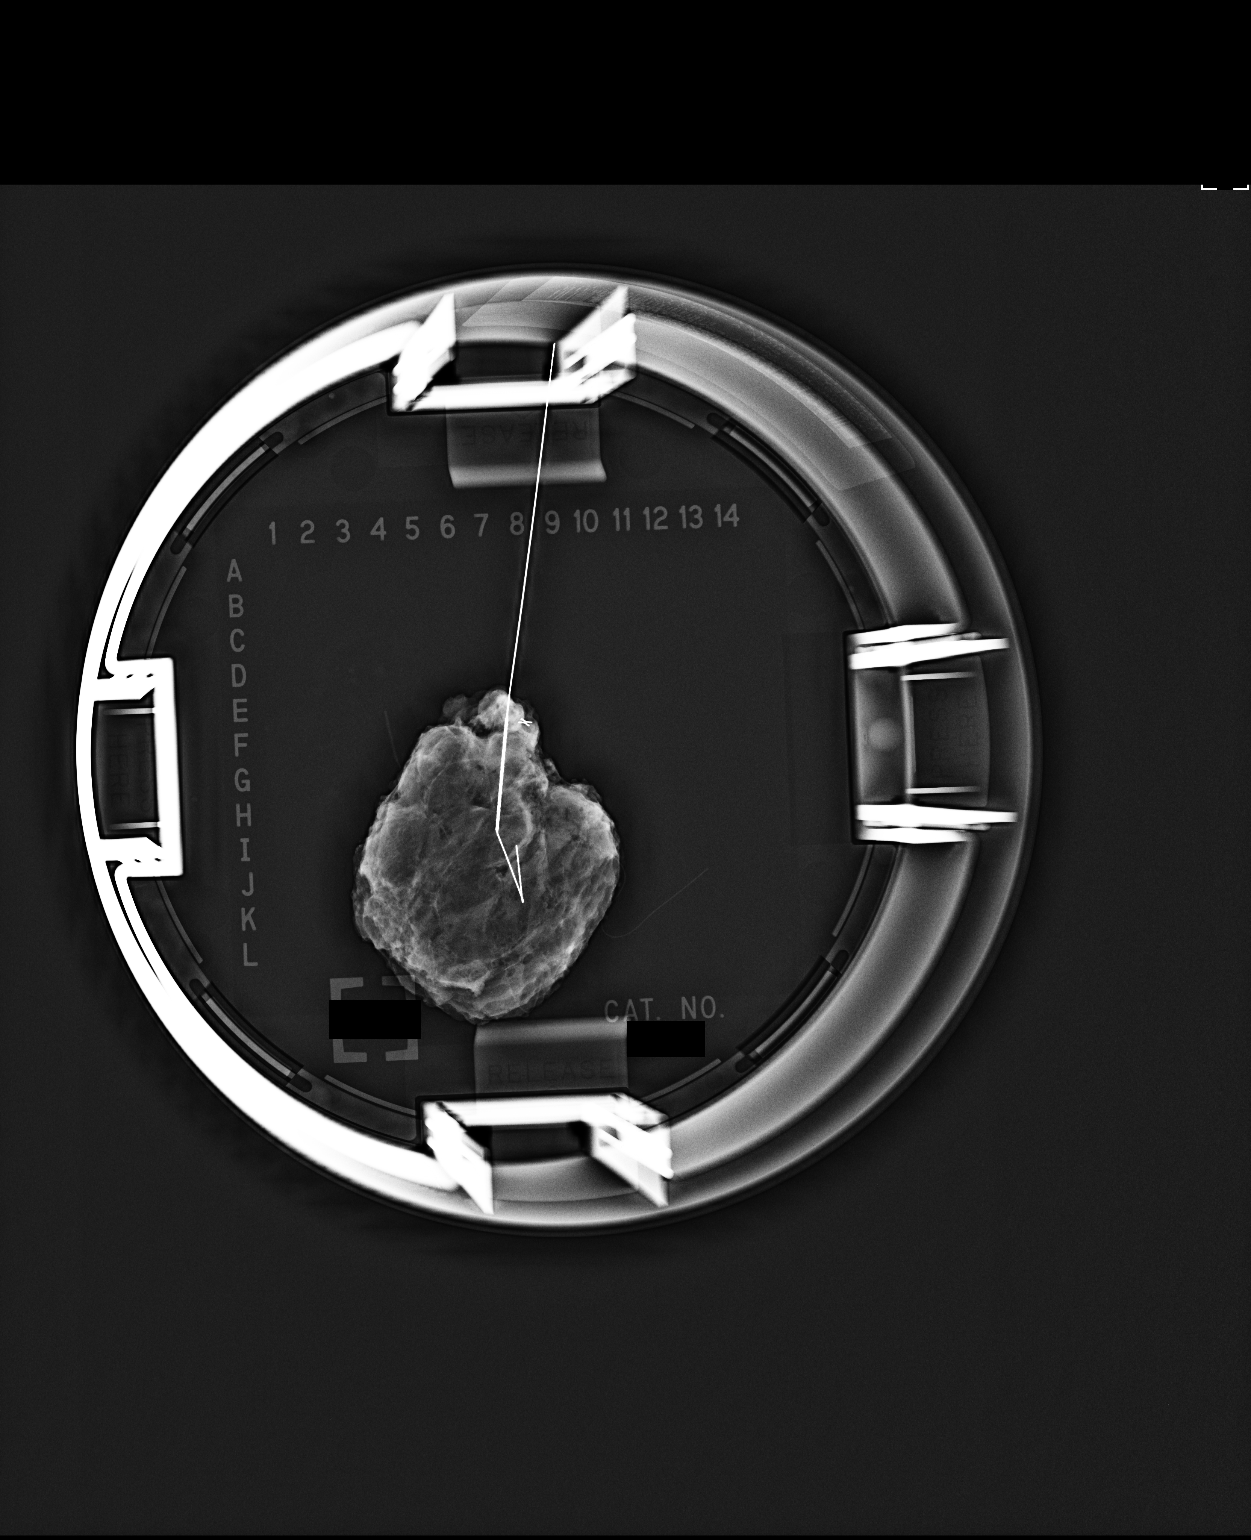

[L SPECIMEN (2 of 2)]
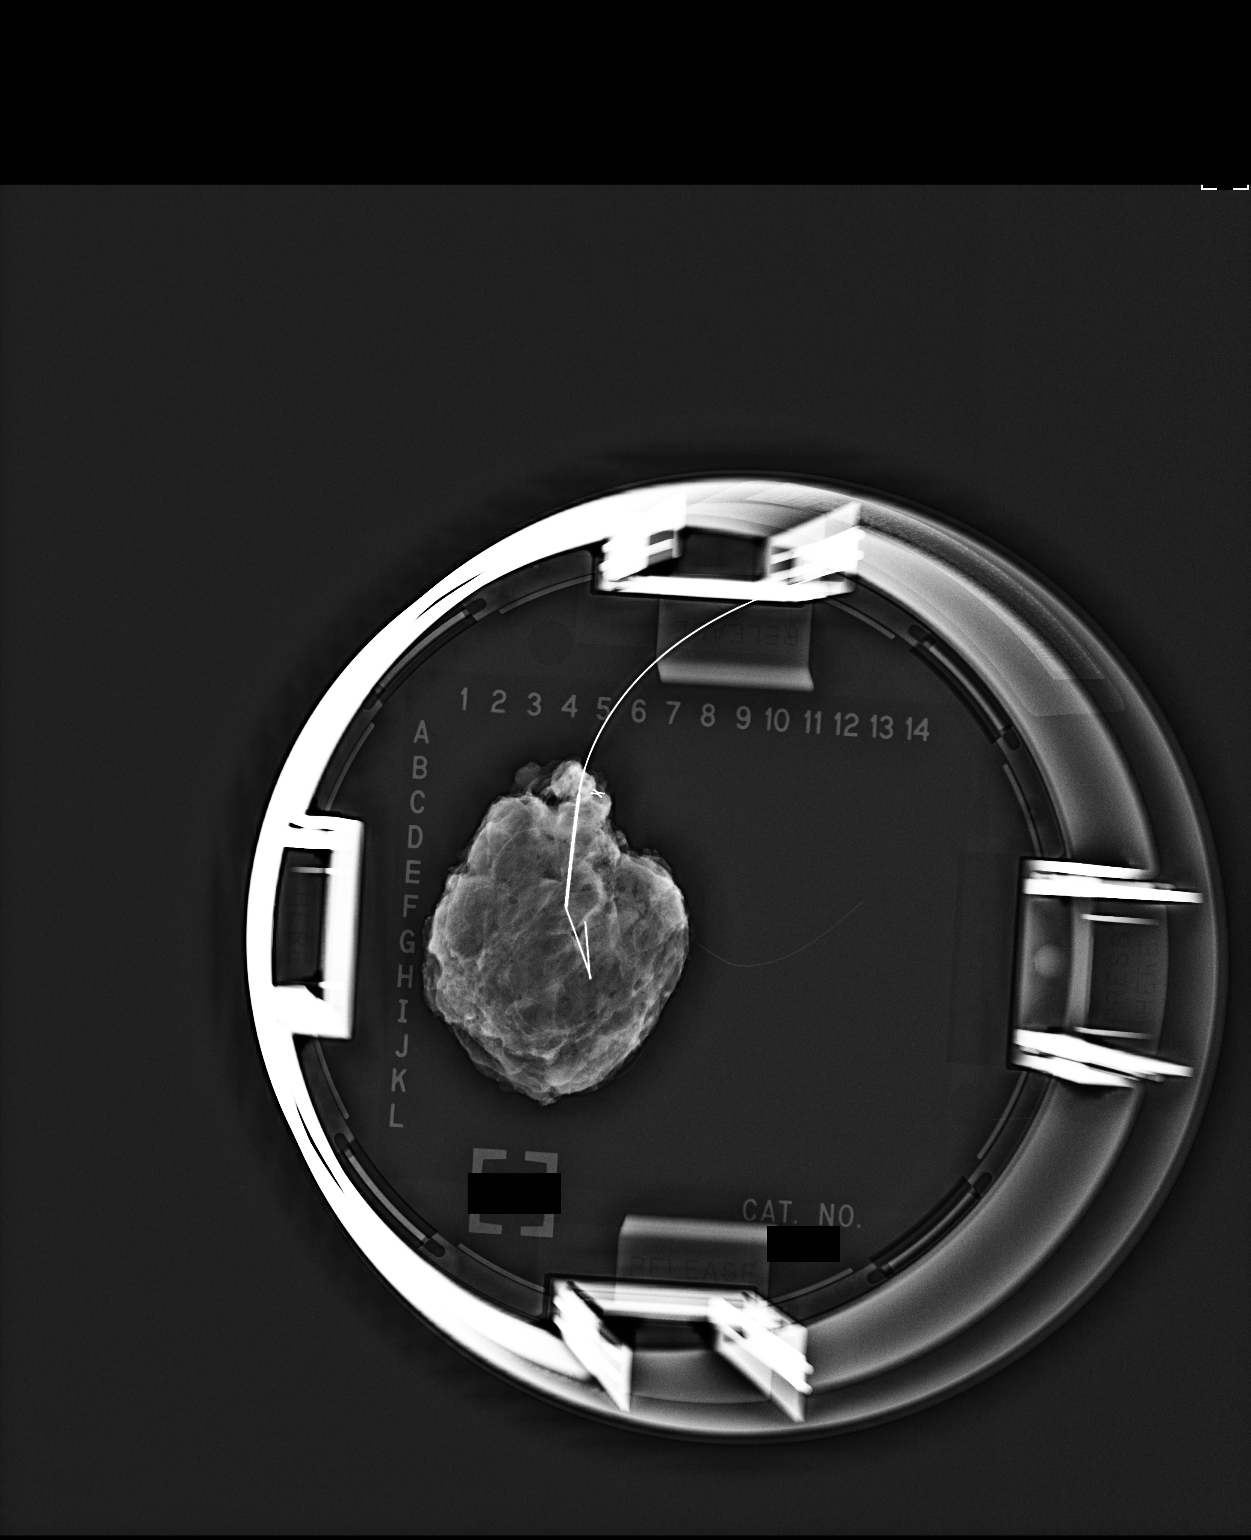

[2 of 2 positions shown; findings below may reference images not displayed]

FINDINGS: Patient presents for needle localization prior to surgical excision
of left breast atypia. I met with the patient and we discussed the
procedure of needle localization including benefits and
alternatives. We discussed the high likelihood of a successful
procedure. We discussed the risks of the procedure, including
infection, bleeding, tissue injury, and further surgery. Informed,
written consent was given. The usual time-out protocol was performed
immediately prior to the procedure.

Using mammographic guidance, sterile technique, 1% lidocaine and a 5
inch modified Kopans needle, an X shaped clip localized using medial
approach. The images were marked for Dr. Tacho.
IMPRESSION: Needle localization left breast. No apparent complications.

## 2019-12-04 IMAGING — MG NEEDLE LOCALIZATION OF THE LEFT BREAST WITH MAMMO GUIDANCE
7 series · 7 of 7 positions shown · non-contrast
Comparison: Previous exams.

CLINICAL DATA: 62-year-old female with biopsy proven left breast
atypical ductal papillary lesion.

EXAM:
NEEDLE LOCALIZATION OF THE LEFT BREAST WITH MAMMO GUIDANCE

[L CC (1 of 4)]
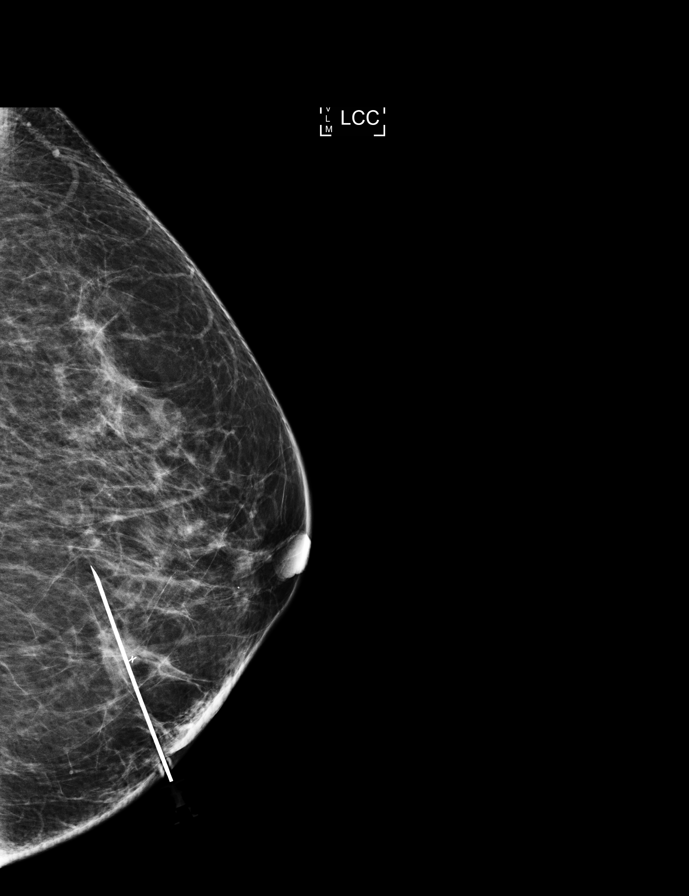

[L CC (2 of 4)]
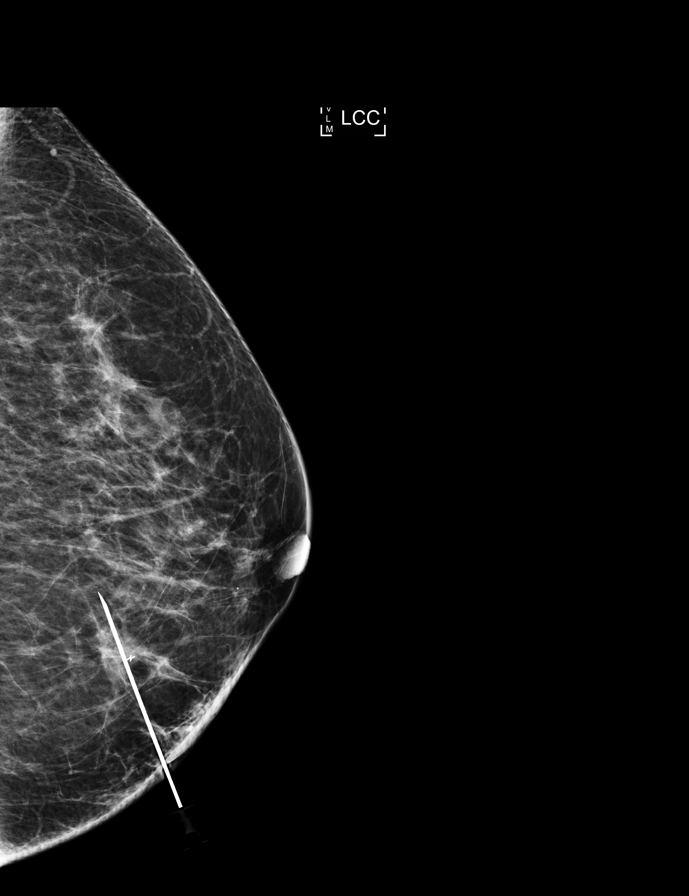

[L ML (1 of 3)]
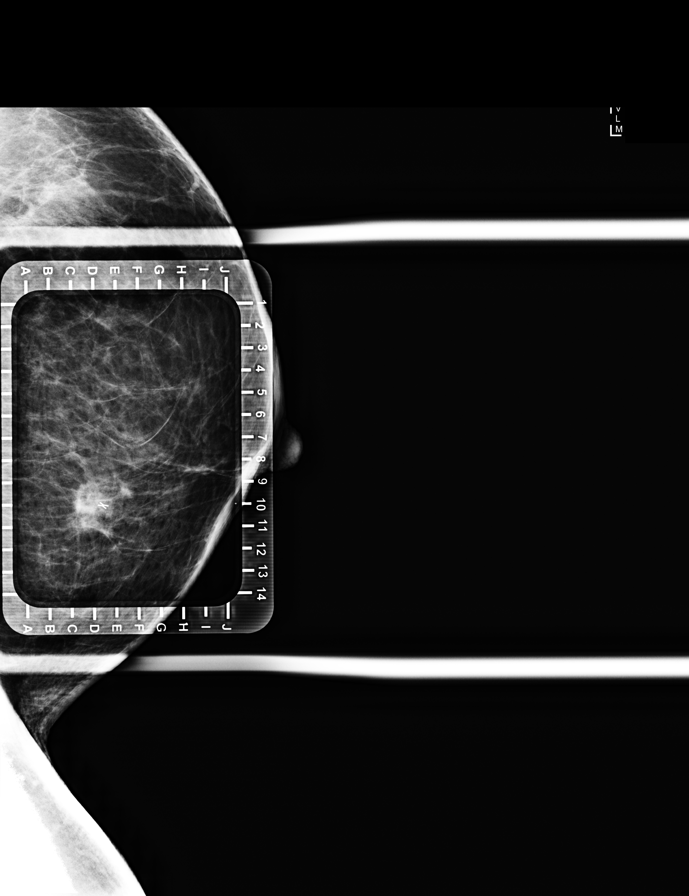

[L ML (2 of 3)]
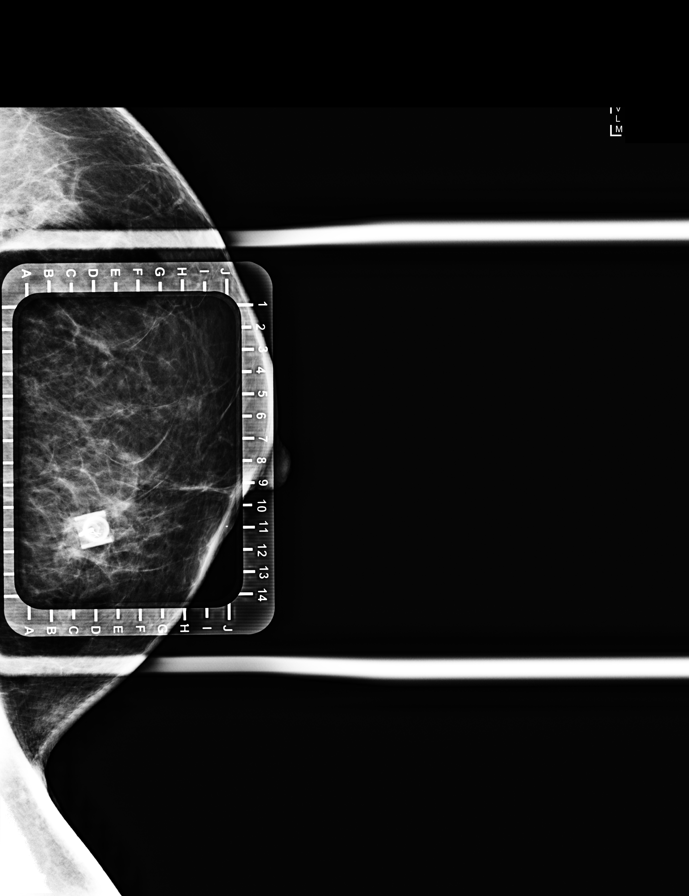

[L CC (3 of 4)]
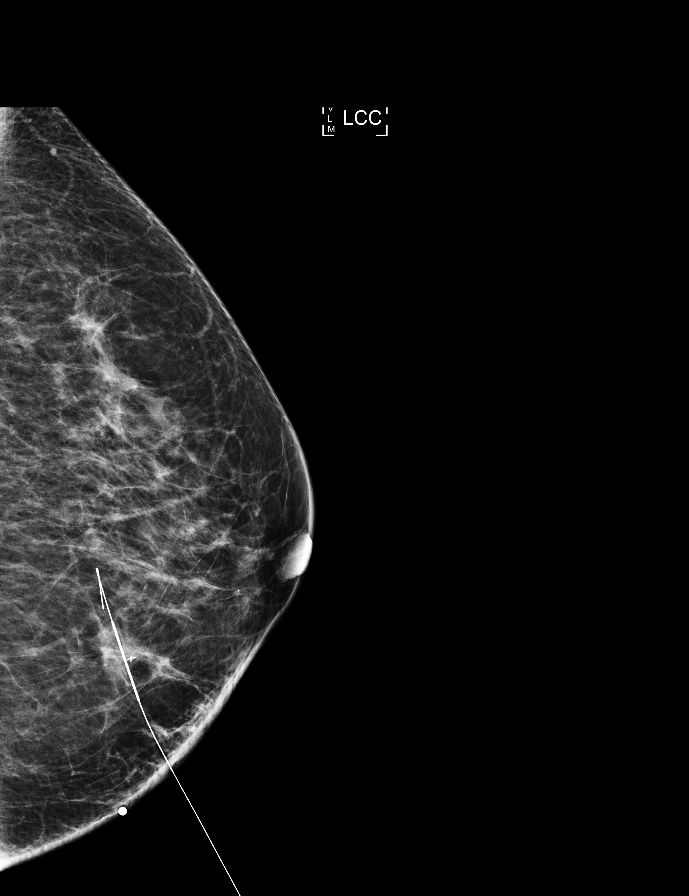

[L CC (4 of 4)]
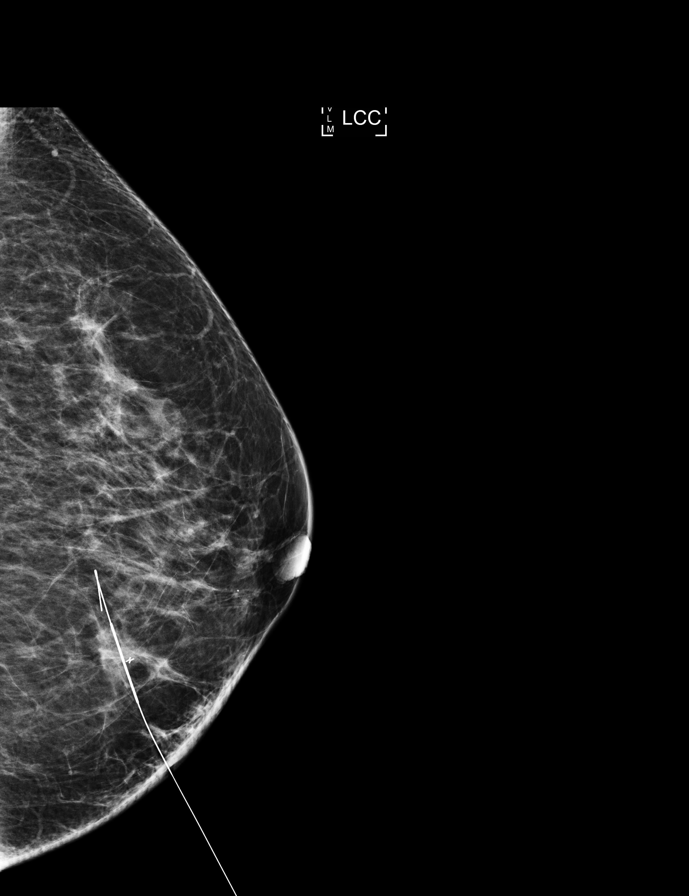

[L ML (3 of 3)]
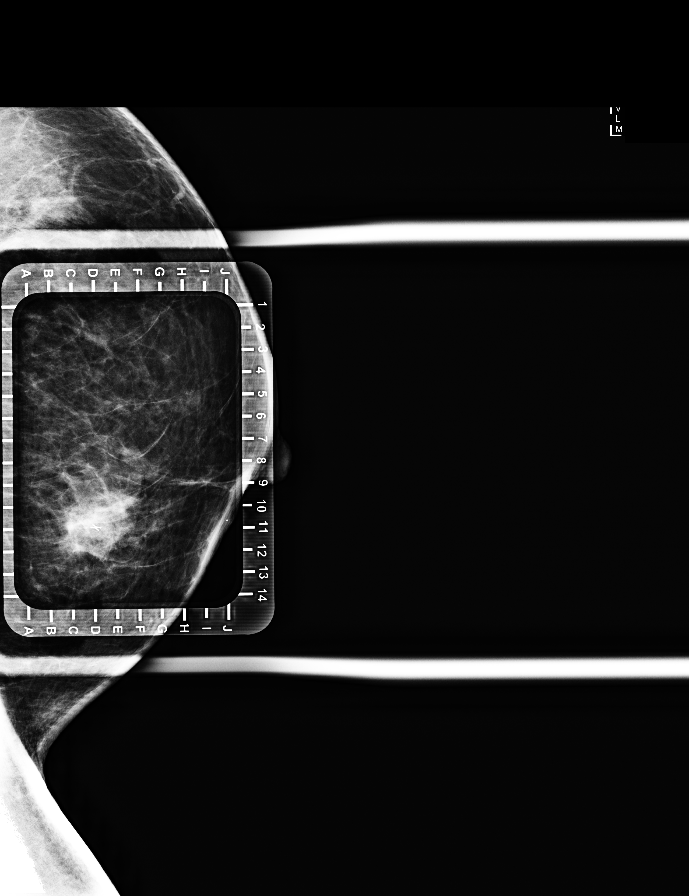

[7 of 7 positions shown; findings below may reference images not displayed]

FINDINGS: Patient presents for needle localization prior to surgical excision
of left breast atypia. I met with the patient and we discussed the
procedure of needle localization including benefits and
alternatives. We discussed the high likelihood of a successful
procedure. We discussed the risks of the procedure, including
infection, bleeding, tissue injury, and further surgery. Informed,
written consent was given. The usual time-out protocol was performed
immediately prior to the procedure.

Using mammographic guidance, sterile technique, 1% lidocaine and a 5
inch modified Kopans needle, an X shaped clip localized using medial
approach. The images were marked for Dr. Tacho.
IMPRESSION: Needle localization left breast. No apparent complications.

## 2019-12-04 MED ORDER — SODIUM CHLORIDE 0.9 % IV SOLN: 500.0000 mL | Freq: Once | INTRAVENOUS | Status: DC

## 2019-12-04 NOTE — Progress Notes (Signed)
VS by CW  Pt's states no medical or surgical changes since previsit or office visit.  

## 2019-12-04 NOTE — Progress Notes (Signed)
Report to PACU, RN, vss, BBS= Clear.  

## 2019-12-04 NOTE — Progress Notes (Signed)
Called to room to assist during endoscopic procedure.  Patient ID and intended procedure confirmed with present staff. Received instructions for my participation in the procedure from the performing physician.  

## 2019-12-04 NOTE — Op Note (Signed)
[facility] Patient Name: Jordan Benson Procedure Date: 12/04/2019 11:53 AM MRN: [id number] Endoscopist: Ladene Artist , MD Age: 64 Referring MD:  Date of Birth: [birth year] Gender: Female Account #: [id number] Procedure:                Colonoscopy Indications:              Colon cancer screening in patient at increased                            risk: Family history of 1st-degree relative with                            colon polyps Medicines:                Monitored Anesthesia Care Procedure:                Pre-Anesthesia Assessment:                           - Prior to the procedure, a History and Physical                            was performed, and patient medications and                            allergies were reviewed. The patient's tolerance of                            previous anesthesia was also reviewed. The risks                            and benefits of the procedure and the sedation                            options and risks were discussed with the patient.                            All questions were answered, and informed consent                            was obtained. Prior Anticoagulants: The patient has                            taken no previous anticoagulant or antiplatelet                            agents. ASA Grade Assessment: II - A patient with                            mild systemic disease. After reviewing the risks                            and benefits, the patient was deemed in  satisfactory condition to undergo the procedure.                           After obtaining informed consent, the colonoscope                            was passed under direct vision. Throughout the                            procedure, the patient's blood pressure, pulse, and                            oxygen saturations were monitored continuously. The                            Colonoscope was introduced through the anus  and                            advanced to the the cecum, identified by                            appendiceal orifice and ileocecal valve. The                            ileocecal valve, appendiceal orifice, and rectum                            were photographed. The quality of the bowel                            preparation was good. The colonoscopy was performed                            without difficulty. The patient tolerated the                            procedure well. Scope In: 12:02:57 PM Scope Out: 12:20:07 PM Scope Withdrawal Time: 0 hours 14 minutes 59 seconds  Total Procedure Duration: 0 hours 17 minutes 10 seconds  Findings:                 The perianal and digital rectal examinations were                            normal.                           A 12 mm polyp was found in the transverse colon.                            The polyp was sessile. The polyp was removed with a                            hot snare. Resection and retrieval were complete.  Four sessile polyps were found in the sigmoid colon                            (2), hepatic flexure and ascending colon. The                            polyps were 6 to 8 mm in size. These polyps were                            removed with a cold snare. Resection and retrieval                            were complete.                           Internal hemorrhoids were found during                            retroflexion. The hemorrhoids were small and Grade                            I (internal hemorrhoids that do not prolapse).                           The exam was otherwise without abnormality on                            direct and retroflexion views. Complications:            No immediate complications. Estimated blood loss:                            None. Estimated Blood Loss:     Estimated blood loss: none. Impression:               - One 12 mm polyp in the transverse colon, removed                             with a hot snare. Resected and retrieved.                           - Four 6 to 8 mm polyps in the sigmoid colon, at                            the hepatic flexure and in the ascending colon,                            removed with a cold snare. Resected and retrieved.                           - Internal hemorrhoids.                           - The examination was otherwise normal on direct  and retroflexion views. Recommendation:           - Repeat colonoscopy after studies are complete for                            surveillance based on pathology results.                           - Patient has a contact number available for                            emergencies. The signs and symptoms of potential                            delayed complications were discussed with the                            patient. Return to normal activities tomorrow.                            Written discharge instructions were provided to the                            patient.                           - Resume previous diet.                           - Continue present medications.                           - Await pathology results.                           - No aspirin, ibuprofen, naproxen, or other                            non-steroidal anti-inflammatory drugs for 2 weeks                            after polyp removal. Ladene Artist, MD 12/04/2019 12:24:25 PM This report has been signed electronically.

## 2019-12-04 NOTE — Patient Instructions (Signed)
NO ASPIRIN,IBUPROFEN,NAPROXEN,OR OTHER NON STEROIDAL ANTI- INFLAMMATORY PRODUCTS FOR 2 WEEKS AFTER POLYP REMOVAL   Handouts on polyps and hemorrhoids given to you today    YOU HAD AN ENDOSCOPIC PROCEDURE TODAY AT [facility]:   Refer to the procedure report that was given to you for any specific questions about what was found during the examination.  If the procedure report does not answer your questions, please call your gastroenterologist to clarify.  If you requested that your care partner not be given the details of your procedure findings, then the procedure report has been included in a sealed envelope for you to review at your convenience later.  YOU SHOULD EXPECT: Some feelings of bloating in the abdomen. Passage of more gas than usual.  Walking can help get rid of the air that was put into your GI tract during the procedure and reduce the bloating. If you had a lower endoscopy (such as a colonoscopy or flexible sigmoidoscopy) you may notice spotting of blood in your stool or on the toilet paper. If you underwent a bowel prep for your procedure, you may not have a normal bowel movement for a few days.  Please Note:  You might notice some irritation and congestion in your nose or some drainage.  This is from the oxygen used during your procedure.  There is no need for concern and it should clear up in a day or so.  SYMPTOMS TO REPORT IMMEDIATELY:   Following lower endoscopy (colonoscopy or flexible sigmoidoscopy):  Excessive amounts of blood in the stool  Significant tenderness or worsening of abdominal pains  Swelling of the abdomen that is new, acute  Fever of 100F or higher    For urgent or emergent issues, a gastroenterologist can be reached at any hour by calling [phone]. Do not use MyChart messaging for urgent concerns.    DIET:  We do recommend a small meal at first, but then you may proceed to your regular diet.  Drink plenty of fluids but you  should avoid alcoholic beverages for 24 hours.  ACTIVITY:  You should plan to take it easy for the rest of today and you should NOT DRIVE or use heavy machinery until tomorrow (because of the sedation medicines used during the test).    FOLLOW UP: Our staff will call the number listed on your records 48-72 hours following your procedure to check on you and address any questions or concerns that you may have regarding the information given to you following your procedure. If we do not reach you, we will leave a message.  We will attempt to reach you two times.  During this call, we will ask if you have developed any symptoms of COVID 19. If you develop any symptoms (ie: fever, flu-like symptoms, shortness of breath, cough etc.) before then, please call [phone].  If you test positive for Covid 19 in the 2 weeks post procedure, please call and report this information to [place].    If any biopsies were taken you will be contacted by phone or by letter within the next 1-3 weeks.  Please call us at [phone] if you have not heard about the biopsies in 3 weeks.    SIGNATURES/CONFIDENTIALITY: You and/or your care partner have signed paperwork which will be entered into your electronic medical record.  These signatures attest to the fact that that the information above on your After Visit Summary has been reviewed and is understood.  Full responsibility of  the confidentiality of this discharge information lies with you and/or your care-partner. 

## 2019-12-06 ENCOUNTER — Telehealth: Payer: Self-pay

## 2019-12-06 NOTE — Telephone Encounter (Signed)
  Follow up Call-  Call back number 12/04/2019  Post procedure Call Back phone  # [phone]  Permission to leave phone message Yes  Some recent data might be hidden     Patient questions:  Do you have a fever, pain , or abdominal swelling? No. Pain Score  0 *  Have you tolerated food without any problems? Yes.    Have you been able to return to your normal activities? Yes.    Do you have any questions about your discharge instructions: Diet   No. Medications  No. Follow up visit  No.  Do you have questions or concerns about your Care? No.  Actions: * If pain score is 4 or above: 1. No action needed, pain <4.Have you developed a fever since your procedure? no  2.   Have you had an respiratory symptoms (SOB or cough) since your procedure? no  3.   Have you tested positive for COVID 19 since your procedure no  4.   Have you had any family members/close contacts diagnosed with the COVID 19 since your procedure?  no   If yes to any of these questions please route to Joylene John, RN and Joella Prince, RN

## 2019-12-12 ENCOUNTER — Encounter: Payer: Self-pay | Admitting: Gastroenterology

## 2019-12-28 ENCOUNTER — Ambulatory Visit: Payer: Medicare PPO

## 2020-01-16 ENCOUNTER — Ambulatory Visit
Admission: RE | Admit: 2020-01-16 | Discharge: 2020-01-16 | Disposition: A | Discharge status: Home / Self Care | Payer: Medicare PPO | Source: Ambulatory Visit | Attending: Internal Medicine | Admitting: Internal Medicine

## 2020-01-16 ENCOUNTER — Other Ambulatory Visit: Payer: Self-pay

## 2020-01-16 DIAGNOSIS — N6092 Unspecified benign mammary dysplasia of left breast: Secondary | ICD-10-CM | POA: Insufficient documentation

## 2020-01-16 DIAGNOSIS — Z1231 Encounter for screening mammogram for malignant neoplasm of breast: Secondary | ICD-10-CM | POA: Diagnosis not present

## 2020-01-24 DIAGNOSIS — Z01818 Encounter for other preprocedural examination: Secondary | ICD-10-CM | POA: Diagnosis not present

## 2020-01-25 DIAGNOSIS — J329 Chronic sinusitis, unspecified: Secondary | ICD-10-CM | POA: Diagnosis not present

## 2020-01-25 DIAGNOSIS — K219 Gastro-esophageal reflux disease without esophagitis: Secondary | ICD-10-CM | POA: Diagnosis not present

## 2020-01-25 DIAGNOSIS — J3489 Other specified disorders of nose and nasal sinuses: Secondary | ICD-10-CM | POA: Diagnosis not present

## 2020-01-25 DIAGNOSIS — J309 Allergic rhinitis, unspecified: Secondary | ICD-10-CM | POA: Diagnosis not present

## 2020-01-25 DIAGNOSIS — J342 Deviated nasal septum: Secondary | ICD-10-CM | POA: Diagnosis not present

## 2020-01-25 DIAGNOSIS — E669 Obesity, unspecified: Secondary | ICD-10-CM | POA: Diagnosis not present

## 2020-01-25 DIAGNOSIS — Z6831 Body mass index (BMI) 31.0-31.9, adult: Secondary | ICD-10-CM | POA: Diagnosis not present

## 2020-01-25 DIAGNOSIS — I10 Essential (primary) hypertension: Secondary | ICD-10-CM | POA: Diagnosis not present

## 2020-01-25 DIAGNOSIS — F419 Anxiety disorder, unspecified: Secondary | ICD-10-CM | POA: Diagnosis not present

## 2020-01-25 DIAGNOSIS — F32A Depression, unspecified: Secondary | ICD-10-CM | POA: Diagnosis not present

## 2020-01-25 DIAGNOSIS — J343 Hypertrophy of nasal turbinates: Secondary | ICD-10-CM | POA: Diagnosis not present

## 2020-02-08 ENCOUNTER — Encounter: Payer: Self-pay | Admitting: Internal Medicine

## 2020-02-11 MED ORDER — ALPRAZOLAM 0.25 MG PO TABS
0.2500 mg | ORAL_TABLET | Freq: Every day | ORAL | 0 refills | Status: DC | PRN
Start: 2020-02-11 — End: 2020-02-22

## 2020-02-11 MED ORDER — ENALAPRIL MALEATE 10 MG PO TABS
ORAL_TABLET | ORAL | 1 refills | Status: DC
Start: 2020-02-11 — End: 2020-12-03

## 2020-02-11 NOTE — Telephone Encounter (Signed)
rx sent in for alprazolam #30 with no refills to [facility] in [place] and enalapril #180 with one refill to [facility].

## 2020-02-14 ENCOUNTER — Ambulatory Visit: Payer: Medicare PPO | Admitting: Internal Medicine

## 2020-02-22 ENCOUNTER — Telehealth: Payer: Self-pay | Admitting: *Deleted

## 2020-02-22 MED ORDER — ALPRAZOLAM 0.25 MG PO TABS
0.2500 mg | ORAL_TABLET | Freq: Every day | ORAL | 0 refills | Status: DC | PRN
Start: 2020-02-22 — End: 2020-05-10

## 2020-02-22 NOTE — Telephone Encounter (Signed)
Left vm for patient to see if she is interested in a virtual visit for Monday due to the inclement weather

## 2020-02-22 NOTE — Telephone Encounter (Signed)
rx sent in for xanax #30 with no refills.   

## 2020-02-23 ENCOUNTER — Encounter: Payer: Self-pay | Admitting: *Deleted

## 2020-02-26 ENCOUNTER — Other Ambulatory Visit: Payer: Self-pay

## 2020-02-26 ENCOUNTER — Inpatient Hospital Stay: Payer: Medicare PPO | Attending: Internal Medicine | Admitting: Internal Medicine

## 2020-02-26 ENCOUNTER — Encounter: Payer: Self-pay | Admitting: Internal Medicine

## 2020-02-26 ENCOUNTER — Other Ambulatory Visit: Payer: Self-pay | Admitting: *Deleted

## 2020-02-26 DIAGNOSIS — N6092 Unspecified benign mammary dysplasia of left breast: Secondary | ICD-10-CM

## 2020-02-26 NOTE — Assessment & Plan Note (Addendum)
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 DEC 2021- Mammo-WNL.   Tolerating well without any major side effects.   # BMD march 2020- Osteopenia- T score= -1.9.  continue calcium plus vitamin D [1000+1000/day]. Will repeat BMD along with next Mammo.  # DISPOSITION: # Mammo dec 2022; BMD # follow up in 12  Months-MD: no labs;  Dr.B

## 2020-02-26 NOTE — Progress Notes (Signed)
I connected with [name] on 02/26/20 at  1:15 PM EST by video enabled telemedicine visit and verified that I am speaking with the correct person using two identifiers.  I discussed the limitations, risks, security and privacy concerns of performing an evaluation and management service by telemedicine and the availability of in-person appointments. I also discussed with the patient that there may be a patient responsible charge related to this service. The patient expressed understanding and agreed to proceed.    Other persons participating in the visit and their role in the encounter: RN/medical reconciliation Patient's location: Home Provider's location: Work  Oncology History   No history exists.   Chief Complaint: Atypical ductal hyperplasia.   History of present illness:Jordan Benson 65 y.o.  female with history of atypical left breast ductal hyperplasia currently on antihormone therapy with anastrozole.   Patient last mammogram December 2021 within normal limits.  Patient denies any worsening bone pain or joint pains.  Chronic mild joint pains.  No significant hot flashes.  Observation/objective: none.   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 DEC 2021- Mammo-WNL.   Tolerating well without any major side effects.   # BMD march 2020- Osteopenia- T score= -1.9.  continue calcium plus vitamin D [1000+1000/day]. Will repeat BMD along with next Mammo.  # DISPOSITION: # Mammo dec 2022; BMD # follow up in 12  Months-MD: no labs;  Dr.B  Follow-up instructions:  I discussed the assessment and treatment plan with the patient.  The patient was provided an opportunity to ask questions and all were answered.  The patient agreed with the plan and demonstrated understanding of instructions.  The patient was advised to call back or seek an in person evaluation  if the symptoms worsen or if the condition fails to improve as anticipated.  Dr. Charlaine Dalton [place] at [facility] 02/26/2020 1:31 PM

## 2020-02-29 ENCOUNTER — Other Ambulatory Visit: Payer: Self-pay | Admitting: Internal Medicine

## 2020-03-03 ENCOUNTER — Other Ambulatory Visit: Payer: Self-pay | Admitting: Internal Medicine

## 2020-03-20 ENCOUNTER — Other Ambulatory Visit: Payer: Self-pay | Admitting: Internal Medicine

## 2020-03-25 ENCOUNTER — Ambulatory Visit (INDEPENDENT_AMBULATORY_CARE_PROVIDER_SITE_OTHER): Payer: Medicare PPO

## 2020-03-25 VITALS — Ht 68.0 in | Wt 205.0 lb

## 2020-03-25 DIAGNOSIS — Z0000 Encounter for general adult medical examination without abnormal findings: Secondary | ICD-10-CM

## 2020-03-25 NOTE — Progress Notes (Signed)
Subjective:   Jordan Benson is a 65 y.o. female who presents for an Initial Medicare Annual Wellness Visit.  Review of Systems    No ROS.  Medicare Wellness Virtual Visit.     Cardiac Risk Factors include: advanced age (>81men, >47 women);hypertension     Objective:    Today's Vitals   03/25/20 1243  Weight: 205 lb (93 kg)  Height: 5\' 8"  (1.727 m)   Body mass index is 31.17 kg/m.  Advanced Directives 03/25/2020 02/26/2020 07/12/2018 03/10/2018 01/12/2018 01/05/2018 01/04/2018  Does Patient Have a Medical Advance Directive? Yes Yes Yes Yes Yes Yes Yes  Type of Paramedic of [place];Living will [place];Living will Living will;Healthcare Power of [place];Living will [place];Living will [place];Living will -  Does patient want to make changes to medical advance directive? No - Patient declined No - Patient declined No - Patient declined No - Patient declined No - Patient declined No - Patient declined -  Copy of [place] in Chart? No - copy requested No - copy requested No - copy requested No - copy requested No - copy requested - -    Current Medications (verified) Outpatient Encounter Medications as of 03/25/2020  Medication Sig  . ALPRAZolam (XANAX) 0.25 MG tablet Take 1 tablet (0.25 mg total) by mouth daily as needed. for anxiety  . amLODipine (NORVASC) 10 MG tablet TAKE 1 TABLET EVERY DAY  . anastrozole (ARIMIDEX) 1 MG tablet TAKE 1 TABLET EVERY DAY  . calcium elemental as carbonate (TUMS ULTRA 1000) 400 MG chewable tablet Chew 1,000 mg by mouth daily as needed for heartburn.   . enalapril (VASOTEC) 10 MG tablet TAKE 1 TABLET(10 MG) BY MOUTH TWICE DAILY  . exemestane (AROMASIN) 25 MG tablet Take 25 mg by mouth daily after breakfast.  . FLUoxetine (PROZAC) 40 MG capsule TAKE 1 CAPSULE EVERY DAY  . Multiple Vitamins-Calcium (ONE-A-DAY WOMENS  PO) Take 1 tablet by mouth daily.  . rosuvastatin (CRESTOR) 10 MG tablet TAKE 1 TABLET EVERY DAY  . triamterene-hydrochlorothiazide (MAXZIDE-25) 37.5-25 MG tablet TAKE 1 TABLET EVERY DAY   No facility-administered encounter medications on file as of 03/25/2020.    Allergies (verified) Patient has no known allergies.   History: Past Medical History:  Diagnosis Date  . Anxiety   . Breast cancer ([place]) 12/2017   hyperplasia only not cancer per pt  . Complication of anesthesia    nausea  . GERD (gastroesophageal reflux disease)   . Headache    optical migraines  . Hyperlipidemia   . Hypertension   . IBS (irritable bowel syndrome)   . Melanoma ([place]) 2019   followed by Dr Evorn Gong, on left arm  . PONV (postoperative nausea and vomiting)   . Vitamin D deficiency    Past Surgical History:  Procedure Laterality Date  . ABDOMINAL HYSTERECTOMY  1992  . BREAST BIOPSY Left 12/21/2017   affirm bx of asymmetry, ATYPICAL DUCTAL PAPILLARY LESION WITH ADJACENT ATYPICAL DUCTAL HYPERPLASIA.   Marland Kitchen BREAST BIOPSY Left 12/21/2017   [place] bx of LN, benign  . BREAST LUMPECTOMY Left 01/05/2018   ADH  . BREAST LUMPECTOMY Left 01/12/2018   neg  . BREAST LUMPECTOMY WITH NEEDLE LOCALIZATION Left 01/05/2018   Procedure: BREAST LUMPECTOMY WITH NEEDLE LOCALIZATION;  Surgeon: Olean Ree, MD;  Location: [facility] ORS;  Service: General;  Laterality: Left;  . BREAST SURGERY    . COLONOSCOPY  2001, 2016  x2 ARM [facility]  . JOINT REPLACEMENT Right 10/2017   TKR  . MOHS SURGERY  2012  . OVARIAN CYST REMOVAL  1991  . POLYPECTOMY    . RE-EXCISION OF BREAST LUMPECTOMY Left 01/12/2018   Procedure: RE-EXCISION OF BREAST LUMPECTOMY;  Surgeon: Olean Ree, MD;  Location: [facility] ORS;  Service: General;  Laterality: Left;  . SEPTOPLASTY  2009  . TOTAL KNEE 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   Procedure: RIGHT TOTAL KNEE ARTHROPLASTY;  Surgeon: Sydnee Cabal, MD;  Location: [facility] ORS;  Service: Orthopedics;  Laterality:  Right;  2 hrs  . TUBAL LIGATION  1985  . tummy tuck  1995  . WISDOM TOOTH EXTRACTION     Family History  Problem Relation Age of Onset  . Cancer Mother 60       breast cancer  . Heart disease Mother   . Hypertension Mother   . Diabetes Mother   . Breast cancer Mother 75  . Heart disease Father   . Breast cancer Sister 86       on HRT  . Colon polyps Sister   . Colon cancer Neg Hx   . Esophageal cancer Neg Hx   . Rectal cancer Neg Hx   . Stomach cancer Neg Hx    Social History   Socioeconomic History  . Marital status: Widowed    Spouse name: Iona Beard  . Number of children: 2  . Years of education: Not on file  . Highest education level: Not on file  Occupational History    Employer: [employer]  Tobacco Use  . Smoking status: Former Smoker    Packs/day: 0.50    Years: 10.00    Pack years: 5.00    Types: Cigarettes    Quit date: 03/12/2000    Years since quitting: 20.0  . Smokeless tobacco: Never Used  Vaping Use  . Vaping Use: Never used  Substance and Sexual Activity  . Alcohol use: Yes    Alcohol/week: 14.0 standard drinks    Types: 14 Glasses of wine per week    Comment: wine daily  . Drug use: Yes    Comment: gummies  . Sexual activity: Not Currently  Other Topics Concern  . Not on file  Social History Narrative  . Not on file   Social Determinants of Health   Financial Resource Strain: Low Risk   . Difficulty of Paying Living Expenses: Not hard at all  Food Insecurity: No Food Insecurity  . Worried About Charity fundraiser in the Last Year: Never true  . Ran Out of Food in the Last Year: Never true  Transportation Needs: No Transportation Needs  . Lack of Transportation (Medical): No  . Lack of Transportation (Non-Medical): No  Physical Activity: Not on file  Stress: No Stress Concern Present  . Feeling of Stress : Not at all  Social Connections: Unknown  . Frequency of Communication with Friends and Family: More than three times a week  . Frequency  of Social Gatherings with Friends and Family: More than three times a week  . Attends Religious Services: Not on file  . Active Member of Clubs or Organizations: Not on file  . Attends Archivist Meetings: Not on file  . Marital Status: Widowed    Tobacco Counseling Counseling given: Not Answered   Clinical Intake:  Pre-visit preparation completed: Yes        Diabetes: No  How often do you need to have someone help you when you  read instructions, pamphlets, or other written materials from your doctor or pharmacy?: 1 - Never  Interpreter Needed?: No      Activities of Daily Living In your present state of health, do you have any difficulty performing the following activities: 03/25/2020  Hearing? N  Vision? N  Difficulty concentrating or making decisions? N  Walking or climbing stairs? N  Dressing or bathing? N  Doing errands, shopping? N  Preparing Food and eating ? N  Using the Toilet? N  In the past six months, have you accidently leaked urine? N  Do you have problems with loss of bowel control? N  Managing your Medications? N  Managing your Finances? N  Housekeeping or managing your Housekeeping? N  Some recent data might be hidden    Patient Care Team: Einar Pheasant, MD as PCP - General (Internal Medicine)  Indicate any recent Medical Services you may have received from other than [facility] providers in the past year (date may be approximate).     Assessment:   This is a routine wellness examination for Jordan Benson.  I connected with Jordan Benson today by telephone and verified that I am speaking with the correct person using two identifiers. Location patient: home Location provider: work Persons participating in the virtual visit: patient, Marine scientist.    I discussed the limitations, risks, security and privacy concerns of performing an evaluation and management service by telephone and the availability of in person appointments. The patient expressed  understanding and verbally consented to this telephonic visit.    Interactive audio and video telecommunications were attempted between this provider and patient, however failed, due to patient having technical difficulties OR patient did not have access to video capability.  We continued and completed visit with audio only.  Some vital signs may be absent or patient reported.   Hearing/Vision screen  Hearing Screening   125Hz  250Hz  500Hz  1000Hz  2000Hz  3000Hz  4000Hz  6000Hz  8000Hz   Right ear:           Left ear:           Comments: Patient is able to hear conversational tones without difficulty.  No issues reported.  Vision Screening Comments: Wears corrective lenses Visual acuity not assessed, virtual visit.  They have seen their ophthalmologist in the last 12 months.     Dietary issues and exercise activities discussed: Current Exercise Habits: Home exercise routine, Type of exercise: walking, Intensity: Mild  Healthy diet Good water intake  Goals    . Follow up with Primary Care Provider     As needed      Depression Screen PHQ 2/9 Scores 03/25/2020 12/01/2019 03/06/2019 06/17/2018 03/03/2017 08/27/2016 10/17/2014  PHQ - 2 Score 0 0 0 0 0 0 0    Fall Risk Fall Risk  03/25/2020 12/01/2019 03/06/2019 01/19/2018 12/28/2017  Falls in the past year? 0 0 0 0 0  Number falls in past yr: 0 0 - - -  Injury with Fall? 0 0 - - -  Follow up Falls evaluation completed Falls evaluation completed - - -    FALL RISK PREVENTION PERTAINING TO THE HOME: Handrails in use when climbing stairs? Yes Home free of loose throw rugs in walkways, pet beds, electrical cords, etc? Yes  Adequate lighting in your home to reduce risk of falls? Yes   ASSISTIVE DEVICES UTILIZED TO PREVENT FALLS: Life alert? No  Use of a cane, walker or w/c? No   TIMED UP AND GO: Was the test performed? No . Virtual visit.  Cognitive Function:  Patient is alert and oriented x3.  Denies difficulty focusing, making  decisions, memory loss.  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  MMSE/6CIT deferred. Normal by direct communication/observation.      Immunizations Immunization History  Administered Date(s) Administered  . Influenza,inj,Quad PF,6+ Mos 10/17/2014, 10/06/2016, 11/05/2018  . Influenza-Unspecified 11/09/2013, 11/30/2017, 10/27/2019  . Moderna Sars-Covid-2 Vaccination 03/11/2019, 04/08/2019, 12/06/2019  . Pneumococcal Conjugate-13 08/01/2019  . Tdap 01/01/2016   Health Maintenance Health Maintenance  Topic Date Due  . COVID-19 Vaccine (4 - Booster for Moderna series) 06/05/2020  . PNA vac Low Risk Adult (2 of 2 - PPSV23) 07/31/2020  . MAMMOGRAM  01/15/2022  . COLONOSCOPY (Pts 45-30yrs Insurance coverage will need to be confirmed)  12/04/2022  . TETANUS/TDAP  12/31/2025  . INFLUENZA VACCINE  Completed  . DEXA SCAN  Completed  . Hepatitis C Screening  Discontinued   Colorectal cancer screening: Type of screening: Colonoscopy. Completed 12/04/19. Repeat every 3 years  Mammogram status: Completed 01/16/20. Repeat every year  Lung Cancer Screening: (Low Dose CT Chest recommended if Age 57-80 years, 30 pack-year currently smoking OR have quit w/in 15years.) does not qualify.   Hepatitis C Screening: discontinued per patient.   Vision Screening: Recommended annual ophthalmology exams for early detection of glaucoma and other disorders of the eye. Is the patient up to date with their annual eye exam?  Yes   Dental Screening: Recommended annual dental exams for proper oral hygiene. Visits every 6 months.   Community Resource Referral / Chronic Care Management: CRR required this visit?  No   CCM required this visit?  No      Plan:   Keep all routine maintenance appointments.   Next scheduled lab 2/18/2 @ 8:00 fasting  Follow up 04/02/20 @ 11:30  I have personally reviewed and noted the following in the patient's chart:   . Medical and social history . Use of  alcohol, tobacco or illicit drugs  . Current medications and supplements . Functional ability and status . Nutritional status . Physical activity . Advanced directives . List of other physicians . Hospitalizations, surgeries, and ER visits in previous 12 months . Vitals . Screenings to include cognitive, depression, and falls . Referrals and appointments  In addition, I have reviewed and discussed with patient certain preventive protocols, quality metrics, and best practice recommendations. A written personalized care plan for preventive services as well as general preventive health recommendations were provided to patient via mychart.     Varney Biles, LPN   [id number]

## 2020-03-25 NOTE — Patient Instructions (Addendum)
Jordan Benson , Thank you for taking time to come for your Medicare Wellness Visit. I appreciate your ongoing commitment to your health goals. Please review the following plan we discussed and let me know if I can assist you in the future.   These are the goals we discussed: Goals    . Follow up with Primary Care Provider     As needed       This is a list of the screening recommended for you and due dates:  Health Maintenance  Topic Date Due  . COVID-19 Vaccine (4 - Booster for Moderna series) 06/05/2020  . Pneumonia vaccines (2 of 2 - PPSV23) 07/31/2020  . Mammogram  01/15/2022  . Colon Cancer Screening  12/04/2022  . Tetanus Vaccine  12/31/2025  . Flu Shot  Completed  . DEXA scan (bone density measurement)  Completed  .  Hepatitis C: One time screening is recommended by Center for Disease Control  (CDC) for  adults born from [birth year] through 1967.   Discontinued      Immunizations Immunization History  Administered Date(s) Administered  . Influenza,inj,Quad PF,6+ Mos 10/17/2014, 10/06/2016, 11/05/2018  . Influenza-Unspecified 11/09/2013, 11/30/2017, 10/27/2019  . Moderna Sars-Covid-2 Vaccination 03/11/2019, 04/08/2019, 12/06/2019  . Pneumococcal Conjugate-13 08/01/2019  . Tdap 01/01/2016   Keep all routine maintenance appointments.   Next scheduled lab 2/18/2 @ 8:00 fasting  Follow up 04/02/20 @ 11:30  Advanced directives: End of life planning; Advance aging; Advanced directives discussed.  Copy of current HCPOA/Living Will requested.    Conditions/risks identified: none new  Follow up in one year for your annual wellness visit.    Preventive Care 14 Years and Older, Female Preventive care refers to lifestyle choices and visits with your health care provider that can promote health and wellness. What does preventive care include?  A yearly physical exam. This is also called an annual well check.  Dental exams once or twice a year.  Routine eye exams. Ask your health  care provider how often you should have your eyes checked.  Personal lifestyle choices, including:  Daily care of your teeth and gums.  Regular physical activity.  Eating a healthy diet.  Avoiding tobacco and drug use.  Limiting alcohol use.  Practicing safe sex.  Taking low-dose aspirin every day.  Taking vitamin and mineral supplements as recommended by your health care provider. What happens during an annual well check? The services and screenings done by your health care provider during your annual well check will depend on your age, overall health, lifestyle risk factors, and family history of disease. Counseling  Your health care provider may ask you questions about your:  Alcohol use.  Tobacco use.  Drug use.  Emotional well-being.  Home and relationship well-being.  Sexual activity.  Eating habits.  History of falls.  Memory and ability to understand (cognition).  Work and work Statistician.  Reproductive health. Screening  You may have the following tests or measurements:  Height, weight, and BMI.  Blood pressure.  Lipid and cholesterol levels. These may be checked every 5 years, or more frequently if you are over 26 years old.  Skin check.  Lung cancer screening. You may have this screening every year starting at age 77 if you have a 30-pack-year history of smoking and currently smoke or have quit within the past 15 years.  Fecal occult blood test (FOBT) of the stool. You may have this test every year starting at age 22.  Flexible sigmoidoscopy or colonoscopy. You  may have a sigmoidoscopy every 5 years or a colonoscopy every 10 years starting at age 109.  Hepatitis C blood test.  Hepatitis B blood test.  Sexually transmitted disease (STD) testing.  Diabetes screening. This is done by checking your blood sugar (glucose) after you have not eaten for a while (fasting). You may have this done every 1-3 years.  Bone density scan. This is done  to screen for osteoporosis. You may have this done starting at age 81.  Mammogram. This may be done every 1-2 years. Talk to your health care provider about how often you should have regular mammograms. Talk with your health care provider about your test results, treatment options, and if necessary, the need for more tests. Vaccines  Your health care provider may recommend certain vaccines, such as:  Influenza vaccine. This is recommended every year.  Tetanus, diphtheria, and acellular pertussis (Tdap, Td) vaccine. You may need a Td booster every 10 years.  Zoster vaccine. You may need this after age 20.  Pneumococcal 13-valent conjugate (PCV13) vaccine. One dose is recommended after age 76.  Pneumococcal polysaccharide (PPSV23) vaccine. One dose is recommended after age 97. Talk to your health care provider about which screenings and vaccines you need and how often you need them. This information is not intended to replace advice given to you by your health care provider. Make sure you discuss any questions you have with your health care provider. Document Released: 02/22/2015 Document Revised: 10/16/2015 Document Reviewed: 11/27/2014 Elsevier Interactive Patient Education  2017 [place] Prevention in the Home Falls can cause injuries. They can happen to people of all ages. There are many things you can do to make your home safe and to help prevent falls. What can I do on the outside of my home?  Regularly fix the edges of walkways and driveways and fix any cracks.  Remove anything that might make you trip as you walk through a door, such as a raised step or threshold.  Trim any bushes or trees on the path to your home.  Use bright outdoor lighting.  Clear any walking paths of anything that might make someone trip, such as rocks or tools.  Regularly check to see if handrails are loose or broken. Make sure that both sides of any steps have handrails.  Any raised decks  and porches should have guardrails on the edges.  Have any leaves, snow, or ice cleared regularly.  Use sand or salt on walking paths during winter.  Clean up any spills in your garage right away. This includes oil or grease spills. What can I do in the bathroom?  Use night lights.  Install grab bars by the toilet and in the tub and shower. Do not use towel bars as grab bars.  Use non-skid mats or decals in the tub or shower.  If you need to sit down in the shower, use a plastic, non-slip stool.  Keep the floor dry. Clean up any water that spills on the floor as soon as it happens.  Remove soap buildup in the tub or shower regularly.  Attach bath mats securely with double-sided non-slip rug tape.  Do not have throw rugs and other things on the floor that can make you trip. What can I do in the bedroom?  Use night lights.  Make sure that you have a light by your bed that is easy to reach.  Do not use any sheets or blankets that are too big for  your bed. They should not hang down onto the floor.  Have a firm chair that has side arms. You can use this for support while you get dressed.  Do not have throw rugs and other things on the floor that can make you trip. What can I do in the kitchen?  Clean up any spills right away.  Avoid walking on wet floors.  Keep items that you use a lot in easy-to-reach places.  If you need to reach something above you, use a strong step stool that has a grab bar.  Keep electrical cords out of the way.  Do not use floor polish or wax that makes floors slippery. If you must use wax, use non-skid floor wax.  Do not have throw rugs and other things on the floor that can make you trip. What can I do with my stairs?  Do not leave any items on the stairs.  Make sure that there are handrails on both sides of the stairs and use them. Fix handrails that are broken or loose. Make sure that handrails are as long as the stairways.  Check any  carpeting to make sure that it is firmly attached to the stairs. Fix any carpet that is loose or worn.  Avoid having throw rugs at the top or bottom of the stairs. If you do have throw rugs, attach them to the floor with carpet tape.  Make sure that you have a light switch at the top of the stairs and the bottom of the stairs. If you do not have them, ask someone to add them for you. What else can I do to help prevent falls?  Wear shoes that:  Do not have high heels.  Have rubber bottoms.  Are comfortable and fit you well.  Are closed at the toe. Do not wear sandals.  If you use a stepladder:  Make sure that it is fully opened. Do not climb a closed stepladder.  Make sure that both sides of the stepladder are locked into place.  Ask someone to hold it for you, if possible.  Clearly mark and make sure that you can see:  Any grab bars or handrails.  First and last steps.  Where the edge of each step is.  Use tools that help you move around (mobility aids) if they are needed. These include:  Canes.  Walkers.  Scooters.  Crutches.  Turn on the lights when you go into a dark area. Replace any light bulbs as soon as they burn out.  Set up your furniture so you have a clear path. Avoid moving your furniture around.  If any of your floors are uneven, fix them.  If there are any pets around you, be aware of where they are.  Review your medicines with your doctor. Some medicines can make you feel dizzy. This can increase your chance of falling. Ask your doctor what other things that you can do to help prevent falls. This information is not intended to replace advice given to you by your health care provider. Make sure you discuss any questions you have with your health care provider. Document Released: 11/22/2008 Document Revised: 07/04/2015 Document Reviewed: 03/02/2014 Elsevier Interactive Patient Education  2017 [employer].

## 2020-03-29 ENCOUNTER — Other Ambulatory Visit: Payer: Self-pay

## 2020-03-29 ENCOUNTER — Other Ambulatory Visit: Payer: Medicare PPO

## 2020-03-29 ENCOUNTER — Other Ambulatory Visit (INDEPENDENT_AMBULATORY_CARE_PROVIDER_SITE_OTHER): Payer: Medicare PPO

## 2020-03-29 DIAGNOSIS — E7800 Pure hypercholesterolemia, unspecified: Secondary | ICD-10-CM

## 2020-03-29 DIAGNOSIS — R739 Hyperglycemia, unspecified: Secondary | ICD-10-CM | POA: Diagnosis not present

## 2020-03-29 DIAGNOSIS — I10 Essential (primary) hypertension: Secondary | ICD-10-CM | POA: Diagnosis not present

## 2020-03-29 LAB — CBC WITH DIFFERENTIAL/PLATELET
Basophils Absolute: 0.1 10*3/uL (ref 0.0–0.1)
Basophils Relative: 1 % (ref 0.0–3.0)
Eosinophils Absolute: 0.2 10*3/uL (ref 0.0–0.7)
Eosinophils Relative: 2.9 % (ref 0.0–5.0)
HCT: 41.2 % (ref 36.0–46.0)
Hemoglobin: 14 g/dL (ref 12.0–15.0)
Lymphocytes Relative: 34.5 % (ref 12.0–46.0)
Lymphs Abs: 2 10*3/uL (ref 0.7–4.0)
MCHC: 34 g/dL (ref 30.0–36.0)
MCV: 91.2 fl (ref 78.0–100.0)
Monocytes Absolute: 0.4 10*3/uL (ref 0.1–1.0)
Monocytes Relative: 6.2 % (ref 3.0–12.0)
Neutro Abs: 3.2 10*3/uL (ref 1.4–7.7)
Neutrophils Relative %: 55.4 % (ref 43.0–77.0)
Platelets: 285 10*3/uL (ref 150.0–400.0)
RBC: 4.52 Mil/uL (ref 3.87–5.11)
RDW: 12.6 % (ref 11.5–15.5)
WBC: 5.7 10*3/uL (ref 4.0–10.5)

## 2020-03-29 LAB — LIPID PANEL
Cholesterol: 148 mg/dL (ref 0–200)
HDL: 59 mg/dL (ref >39.00)
LDL Cholesterol: 65 mg/dL (ref 0–99)
NonHDL: 89.13
Total CHOL/HDL Ratio: 3
Triglycerides: 121 mg/dL (ref 0.0–149.0)
VLDL: 24.2 mg/dL (ref 0.0–40.0)

## 2020-03-29 LAB — BASIC METABOLIC PANEL
BUN: 20 mg/dL (ref 6–23)
CO2: 29 mEq/L (ref 19–32)
Calcium: 9.6 mg/dL (ref 8.4–10.5)
Chloride: 101 mEq/L (ref 96–112)
Creatinine, Ser: 0.82 mg/dL (ref 0.40–1.20)
GFR: 74.64 mL/min (ref >60.00)
Glucose, Bld: 101 mg/dL — ABNORMAL HIGH (ref 70–99)
Potassium: 3.5 mEq/L (ref 3.5–5.1)
Sodium: 139 mEq/L (ref 135–145)

## 2020-03-29 LAB — HEPATIC FUNCTION PANEL
ALT: 29 U/L (ref 0–35)
AST: 30 U/L (ref 0–37)
Albumin: 4.4 g/dL (ref 3.5–5.2)
Alkaline Phosphatase: 72 U/L (ref 39–117)
Bilirubin, Direct: 0.3 mg/dL (ref 0.0–0.3)
Total Bilirubin: 1.6 mg/dL — ABNORMAL HIGH (ref 0.2–1.2)
Total Protein: 7.1 g/dL (ref 6.0–8.3)

## 2020-03-29 LAB — TSH: TSH: 2.18 u[IU]/mL (ref 0.35–4.50)

## 2020-03-29 LAB — HEMOGLOBIN A1C: Hgb A1c MFr Bld: 5.5 % (ref 4.6–6.5)

## 2020-04-02 ENCOUNTER — Other Ambulatory Visit: Payer: Self-pay

## 2020-04-02 ENCOUNTER — Ambulatory Visit: Payer: Medicare PPO | Admitting: Internal Medicine

## 2020-04-02 DIAGNOSIS — N6092 Unspecified benign mammary dysplasia of left breast: Secondary | ICD-10-CM

## 2020-04-02 DIAGNOSIS — W5503XA Scratched by cat, initial encounter: Secondary | ICD-10-CM

## 2020-04-02 DIAGNOSIS — I10 Essential (primary) hypertension: Secondary | ICD-10-CM | POA: Diagnosis not present

## 2020-04-02 DIAGNOSIS — F419 Anxiety disorder, unspecified: Secondary | ICD-10-CM | POA: Diagnosis not present

## 2020-04-02 DIAGNOSIS — R739 Hyperglycemia, unspecified: Secondary | ICD-10-CM | POA: Diagnosis not present

## 2020-04-02 DIAGNOSIS — E7800 Pure hypercholesterolemia, unspecified: Secondary | ICD-10-CM

## 2020-04-02 DIAGNOSIS — R7989 Other specified abnormal findings of blood chemistry: Secondary | ICD-10-CM

## 2020-04-02 DIAGNOSIS — Z8371 Family history of colonic polyps: Secondary | ICD-10-CM

## 2020-04-02 DIAGNOSIS — R945 Abnormal results of liver function studies: Secondary | ICD-10-CM | POA: Diagnosis not present

## 2020-04-02 DIAGNOSIS — Z85820 Personal history of malignant melanoma of skin: Secondary | ICD-10-CM | POA: Diagnosis not present

## 2020-04-02 MED ORDER — MUPIROCIN 2 % EX OINT: 1.0000 "application " | TOPICAL_OINTMENT | Freq: Two times a day (BID) | CUTANEOUS | 0 refills | Status: DC

## 2020-04-02 NOTE — Progress Notes (Signed)
Patient ID: Jordan Benson, female   DOB: Dec 25, 1955, 65 y.o.   MRN: [id number]   Subjective:    Patient ID: Jordan Benson, female    DOB: [birth year], 65 y.o.   MRN: [id number]  HPI This visit occurred during the SARS-CoV-2 public health emergency.  Safety protocols were in place, including screening questions prior to the visit, additional usage of staff PPE, and extensive cleaning of exam room while observing appropriate contact time as indicated for disinfecting solutions.  Patient here for a scheduled follow up.  Here to follow up regarding her blood pressure and cholesterol.  She is doing better.  Feels better.  Has rescued two kittens.  This has helped her tremendously.  Has good support. Trying to stay active.  No chest pain or sob reported.  No abdominal pain or bowel change reported.  Discussed increased alcohol intake. She is watching and has cut down overall.  Seeing Dr Rogue Bussing  - stable - on arimidex.  Does have scratch on her left hand - cats.  No surrounding erythema.  No significant swelling.     Past Medical History:  Diagnosis Date  . Anxiety   . Breast cancer ([place]) 12/2017   hyperplasia only not cancer per pt  . Complication of anesthesia    nausea  . GERD (gastroesophageal reflux disease)   . Headache    optical migraines  . Hyperlipidemia   . Hypertension   . IBS (irritable bowel syndrome)   . Melanoma ([place]) 2019   followed by Dr Evorn Gong, on left arm  . PONV (postoperative nausea and vomiting)   . Vitamin D deficiency    Past Surgical History:  Procedure Laterality Date  . ABDOMINAL HYSTERECTOMY  1992  . BREAST BIOPSY Left 12/21/2017   affirm bx of asymmetry, ATYPICAL DUCTAL PAPILLARY LESION WITH ADJACENT ATYPICAL DUCTAL HYPERPLASIA.   Marland Kitchen BREAST BIOPSY Left 12/21/2017   [place] bx of LN, benign  . BREAST LUMPECTOMY Left 01/05/2018   ADH  . BREAST LUMPECTOMY Left 01/12/2018   neg  . BREAST LUMPECTOMY WITH NEEDLE LOCALIZATION Left 01/05/2018   Procedure:  BREAST LUMPECTOMY WITH NEEDLE LOCALIZATION;  Surgeon: Olean Ree, MD;  Location: [facility] ORS;  Service: General;  Laterality: Left;  . BREAST SURGERY    . COLONOSCOPY  2001, 2016   x2 ARM and at [place]  . JOINT REPLACEMENT Right 10/2017   TKR  . MOHS SURGERY  2012  . OVARIAN CYST REMOVAL  1991  . POLYPECTOMY    . RE-EXCISION OF BREAST LUMPECTOMY Left 01/12/2018   Procedure: RE-EXCISION OF BREAST LUMPECTOMY;  Surgeon: Olean Ree, MD;  Location: [facility] ORS;  Service: General;  Laterality: Left;  . SEPTOPLASTY  2009  . TOTAL KNEE 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   Procedure: RIGHT TOTAL KNEE ARTHROPLASTY;  Surgeon: Sydnee Cabal, MD;  Location: [facility];  Service: Orthopedics;  Laterality: Right;  2 hrs  . TUBAL LIGATION  1985  . tummy tuck  1995  . WISDOM TOOTH EXTRACTION     Family History  Problem Relation Age of Onset  . Cancer Mother 40       breast cancer  . Heart disease Mother   . Hypertension Mother   . Diabetes Mother   . Breast cancer Mother 34  . Heart disease Father   . Breast cancer Sister 70       on HRT  . Colon polyps Sister   . Colon cancer Neg Hx   . Esophageal cancer Neg Hx   .  Rectal cancer Neg Hx   . Stomach cancer Neg Hx    Social History   Socioeconomic History  . Marital status: Widowed    Spouse name: Iona Beard  . Number of children: 2  . Years of education: Not on file  . Highest education level: Not on file  Occupational History    Employer: [employer]  Tobacco Use  . Smoking status: Former Smoker    Packs/day: 0.50    Years: 10.00    Pack years: 5.00    Types: Cigarettes    Quit date: 03/12/2000    Years since quitting: 20.0  . Smokeless tobacco: Never Used  Vaping Use  . Vaping Use: Never used  Substance and Sexual Activity  . Alcohol use: Yes    Alcohol/week: 14.0 standard drinks    Types: 14 Glasses of wine per week    Comment: wine daily  . Drug use: Yes    Comment: gummies  . Sexual activity: Not Currently  Other Topics Concern  . Not on  file  Social History Narrative  . Not on file   Social Determinants of Health   Financial Resource Strain: Low Risk   . Difficulty of Paying Living Expenses: Not hard at all  Food Insecurity: No Food Insecurity  . Worried About Charity fundraiser in the Last Year: Never true  . Ran Out of Food in the Last Year: Never true  Transportation Needs: No Transportation Needs  . Lack of Transportation (Medical): No  . Lack of Transportation (Non-Medical): No  Physical Activity: Not on file  Stress: No Stress Concern Present  . Feeling of Stress : Not at all  Social Connections: Unknown  . Frequency of Communication with Friends and Family: More than three times a week  . Frequency of Social Gatherings with Friends and Family: More than three times a week  . Attends Religious Services: Not on file  . Active Member of Clubs or Organizations: Not on file  . Attends Archivist Meetings: Not on file  . Marital Status: Widowed    Outpatient Encounter Medications as of 04/02/2020  Medication Sig  . ALPRAZolam (XANAX) 0.25 MG tablet Take 1 tablet (0.25 mg total) by mouth daily as needed. for anxiety  . amLODipine (NORVASC) 10 MG tablet TAKE 1 TABLET EVERY DAY  . anastrozole (ARIMIDEX) 1 MG tablet TAKE 1 TABLET EVERY DAY  . calcium elemental as carbonate (TUMS ULTRA 1000) 400 MG chewable tablet Chew 1,000 mg by mouth daily as needed for heartburn.   Marland Kitchen CALCIUM-VITAMIN D PO Take by mouth.  . enalapril (VASOTEC) 10 MG tablet TAKE 1 TABLET(10 MG) BY MOUTH TWICE DAILY  . exemestane (AROMASIN) 25 MG tablet Take 25 mg by mouth daily after breakfast.  . FLUoxetine (PROZAC) 40 MG capsule TAKE 1 CAPSULE EVERY DAY  . Multiple Vitamins-Calcium (ONE-A-DAY WOMENS PO) Take 1 tablet by mouth daily.  . mupirocin ointment (BACTROBAN) 2 % Apply 1 application topically 2 (two) times daily.  . rosuvastatin (CRESTOR) 10 MG tablet TAKE 1 TABLET EVERY DAY  . triamterene-hydrochlorothiazide (MAXZIDE-25)  37.5-25 MG tablet TAKE 1 TABLET EVERY DAY   No facility-administered encounter medications on file as of 04/02/2020.    Review of Systems  Constitutional: Negative for appetite change and unexpected weight change.  HENT: Negative for congestion and sinus pressure.   Respiratory: Negative for cough, chest tightness and shortness of breath.   Cardiovascular: Negative for chest pain, palpitations and leg swelling.  Gastrointestinal: Negative for abdominal  pain, diarrhea, nausea and vomiting.  Genitourinary: Negative for difficulty urinating and dysuria.  Musculoskeletal: Negative for joint swelling and myalgias.  Skin: Negative for color change and rash.  Neurological: Negative for dizziness, light-headedness and headaches.  Psychiatric/Behavioral: Negative for agitation and dysphoric mood.       Objective:    Physical Exam Vitals reviewed.  Constitutional:      General: She is not in acute distress.    Appearance: Normal appearance.  HENT:     Head: Normocephalic and atraumatic.     Right Ear: External ear normal.     Left Ear: External ear normal.     Mouth/Throat:     Mouth: Oropharynx is clear and moist.  Eyes:     General: No scleral icterus.       Right eye: No discharge.        Left eye: No discharge.     Conjunctiva/sclera: Conjunctivae normal.  Neck:     Thyroid: No thyromegaly.  Cardiovascular:     Rate and Rhythm: Normal rate and regular rhythm.  Pulmonary:     Effort: No respiratory distress.     Breath sounds: Normal breath sounds. No wheezing.  Abdominal:     General: Bowel sounds are normal.     Palpations: Abdomen is soft.     Tenderness: There is no abdominal tenderness.  Musculoskeletal:        General: No swelling, tenderness or edema.     Cervical back: Neck supple. No tenderness.  Lymphadenopathy:     Cervical: No cervical adenopathy.  Skin:    Findings: No erythema or rash.  Neurological:     Mental Status: She is alert.  Psychiatric:         Mood and Affect: Mood normal.        Behavior: Behavior normal.     BP 122/78   Pulse 88   Temp 98.1 F (36.7 C) (Oral)   Resp 16   Ht $R'5\' 8"'aW$  (1.727 m)   Wt 203 lb 3.2 oz (92.2 kg)   SpO2 98%   BMI 30.90 kg/m  Wt Readings from Last 3 Encounters:  04/02/20 203 lb 3.2 oz (92.2 kg)  03/25/20 205 lb (93 kg)  12/04/19 205 lb (93 kg)     Lab Results  Component Value Date   WBC 5.7 03/29/2020   HGB 14.0 03/29/2020   HCT 41.2 03/29/2020   PLT 285.0 03/29/2020   GLUCOSE 101 (H) 03/29/2020   CHOL 148 03/29/2020   TRIG 121.0 03/29/2020   HDL 59.00 03/29/2020   LDLDIRECT 138.0 03/20/2019   LDLCALC 65 03/29/2020   ALT 29 03/29/2020   AST 30 03/29/2020   NA 139 03/29/2020   K 3.5 03/29/2020   CL 101 03/29/2020   CREATININE 0.82 03/29/2020   BUN 20 03/29/2020   CO2 29 03/29/2020   TSH 2.18 03/29/2020   INR 0.94 08/13/2017   HGBA1C 5.5 03/29/2020    MM 3D SCREEN BREAST BILATERAL  Result Date: 01/18/2020 CLINICAL DATA:  Screening. EXAM: DIGITAL SCREENING BILATERAL MAMMOGRAM WITH TOMO AND CAD COMPARISON:  Previous exam(s). ACR Breast Density Category b: There are scattered areas of fibroglandular density. FINDINGS: There are no findings suspicious for malignancy. Images were processed with CAD. IMPRESSION: No mammographic evidence of malignancy. A result letter of this screening mammogram will be mailed directly to the patient. RECOMMENDATION: Screening mammogram in one year. (Code:SM-B-01Y) BI-RADS CATEGORY  1: Negative. Electronically Signed   By: Percell Locus.D.  On: 01/18/2020 12:54       Assessment & Plan:   Problem List Items Addressed This Visit    Abnormal liver function test    Continue diet and exercise.  Discussed avoiding increased alcohol intake.  Follow liver function tests.        Anxiety    Doing better.  Continue prozac.       Atypical ductal hyperplasia of left breast    Left breast - atypical ductal hyperplasia - on arimidex.  Mammogram 01/18/20 -  Birads I.  Continue f/u with hematology.       Cat scratch    bactroban as directed. Follow.  Notify me if peristet problems.        Essential hypertension, benign    Blood pressure doing well on enalapril, triam/hctz and amlodipine.  Continue current medication regimen.  Follow pressures.  Follow metabolic panel.       Family history of colonic polyps    Colonoscopy 12/04/19 - several polyps present and internal hemorrhoids.  Followed by Dr Lucio Edward.       History of melanoma    Followed by dermatology.       Hyperbilirubinemia    Follow liver panel.  Probably normal variant.        Hypercholesterolemia    On crestor.  Low cholesterol diet and exercise.  Follow lipid panel and liver function tests.   Lab Results  Component Value Date   CHOL 148 03/29/2020   HDL 59.00 03/29/2020   LDLCALC 65 03/29/2020   LDLDIRECT 138.0 03/20/2019   TRIG 121.0 03/29/2020   CHOLHDL 3 03/29/2020        Hyperglycemia    Low carb diet and exercise.  Follow met b and a1c.   Lab Results  Component Value Date   HGBA1C 5.5 03/29/2020            Einar Pheasant, MD

## 2020-04-03 ENCOUNTER — Encounter: Payer: Self-pay | Admitting: Internal Medicine

## 2020-04-05 DIAGNOSIS — H02409 Unspecified ptosis of unspecified eyelid: Secondary | ICD-10-CM | POA: Diagnosis not present

## 2020-04-07 ENCOUNTER — Encounter: Payer: Self-pay | Admitting: Internal Medicine

## 2020-04-07 DIAGNOSIS — W5503XA Scratched by cat, initial encounter: Secondary | ICD-10-CM | POA: Insufficient documentation

## 2020-04-07 NOTE — Assessment & Plan Note (Signed)
Colonoscopy 12/04/19 - several polyps present and internal hemorrhoids.  Followed by Dr Malcolm Stark.  

## 2020-04-07 NOTE — Assessment & Plan Note (Signed)
Left breast - atypical ductal hyperplasia - on arimidex.  Mammogram 01/18/20 - Birads I.  Continue f/u with hematology.

## 2020-04-07 NOTE — Assessment & Plan Note (Signed)
Continue diet and exercise.  Discussed avoiding increased alcohol intake.  Follow liver function tests.

## 2020-04-07 NOTE — Assessment & Plan Note (Signed)
On crestor.  Low cholesterol diet and exercise.  Follow lipid panel and liver function tests.   Lab Results  Component Value Date   CHOL 148 03/29/2020   HDL 59.00 03/29/2020   LDLCALC 65 03/29/2020   LDLDIRECT 138.0 03/20/2019   TRIG 121.0 03/29/2020   CHOLHDL 3 03/29/2020

## 2020-04-07 NOTE — Assessment & Plan Note (Signed)
Followed by dermatology

## 2020-04-07 NOTE — Assessment & Plan Note (Signed)
Follow liver panel.  Probably normal variant.

## 2020-04-07 NOTE — Assessment & Plan Note (Signed)
bactroban as directed. Follow.  Notify me if peristet problems.

## 2020-04-07 NOTE — Assessment & Plan Note (Signed)
Blood pressure doing well on enalapril, triam/hctz and amlodipine.  Continue current medication regimen.  Follow pressures.  Follow metabolic panel.

## 2020-04-07 NOTE — Assessment & Plan Note (Signed)
Doing better.  Continue prozac.

## 2020-04-07 NOTE — Assessment & Plan Note (Signed)
Low carb diet and exercise.  Follow met b and a1c.   Lab Results  Component Value Date   HGBA1C 5.5 03/29/2020

## 2020-05-06 DIAGNOSIS — F1010 Alcohol abuse, uncomplicated: Secondary | ICD-10-CM | POA: Diagnosis not present

## 2020-05-06 DIAGNOSIS — F332 Major depressive disorder, recurrent severe without psychotic features: Secondary | ICD-10-CM | POA: Diagnosis not present

## 2020-05-06 DIAGNOSIS — F339 Major depressive disorder, recurrent, unspecified: Secondary | ICD-10-CM | POA: Diagnosis not present

## 2020-05-06 DIAGNOSIS — F1020 Alcohol dependence, uncomplicated: Secondary | ICD-10-CM | POA: Diagnosis not present

## 2020-05-06 DIAGNOSIS — F10239 Alcohol dependence with withdrawal, unspecified: Secondary | ICD-10-CM | POA: Diagnosis not present

## 2020-05-06 DIAGNOSIS — Z9012 Acquired absence of left breast and nipple: Secondary | ICD-10-CM | POA: Diagnosis not present

## 2020-05-06 DIAGNOSIS — E785 Hyperlipidemia, unspecified: Secondary | ICD-10-CM | POA: Diagnosis not present

## 2020-05-06 DIAGNOSIS — I10 Essential (primary) hypertension: Secondary | ICD-10-CM | POA: Diagnosis not present

## 2020-05-06 DIAGNOSIS — N62 Hypertrophy of breast: Secondary | ICD-10-CM | POA: Diagnosis not present

## 2020-05-06 DIAGNOSIS — Z8742 Personal history of other diseases of the female genital tract: Secondary | ICD-10-CM | POA: Diagnosis not present

## 2020-05-10 ENCOUNTER — Encounter: Payer: Self-pay | Admitting: Internal Medicine

## 2020-05-10 ENCOUNTER — Telehealth: Payer: Self-pay | Admitting: Internal Medicine

## 2020-05-10 ENCOUNTER — Telehealth (INDEPENDENT_AMBULATORY_CARE_PROVIDER_SITE_OTHER): Payer: Medicare PPO | Admitting: Internal Medicine

## 2020-05-10 DIAGNOSIS — F1010 Alcohol abuse, uncomplicated: Secondary | ICD-10-CM | POA: Diagnosis not present

## 2020-05-10 MED ORDER — NALTREXONE HCL 50 MG PO TABS: 50.0000 mg | ORAL_TABLET | Freq: Every day | ORAL | 0 refills | Status: DC

## 2020-05-10 NOTE — Telephone Encounter (Signed)
The patient was discharged from [facility] today for Alcohol abuse. The provider told her to call her primary to prescribe her Antabuse to get her through the weekend.

## 2020-05-10 NOTE — Telephone Encounter (Signed)
See if can get any information from [facility].

## 2020-05-10 NOTE — Telephone Encounter (Signed)
Patient was in rehab at [facility] since Monday was released today and was advised by Dr. Bradley Ferris, PCP would prescribe Anti-buse Going to [facility]  on 05/12/20 fo rfollow up from Rehab. Patient stated she is afraid without the medication she will abuse alcohol this weekend.

## 2020-05-10 NOTE — Telephone Encounter (Signed)
Dr Nicki Reaper asked if you would call her. This is not something we typically do.

## 2020-05-10 NOTE — Telephone Encounter (Signed)
Patient scheduled for today virtual at 1630

## 2020-05-10 NOTE — Telephone Encounter (Signed)
See if she can do a virtual with me today.  Thanks.

## 2020-05-10 NOTE — Telephone Encounter (Signed)
[facility], [phone] I was able to verify the patient was DC from that facility today. From the [facility]. I have not been able to reach this Unit with six attempts made, I have left a message on the nurses line asking them to please return call to office that patient is asking for help with medications and we need more information.

## 2020-05-10 NOTE — Telephone Encounter (Signed)
Left message to call office. The original number left to return call is her sisters and patient is longer with her sister so I called her mobile number as advised and no answer left message to call office.

## 2020-05-10 NOTE — Progress Notes (Signed)
Patient ID: Jordan Benson, female   DOB: 1955/08/26, 65 y.o.   MRN: [id number]   Virtual Visit via video Note  This visit type was conducted due to national recommendations for restrictions regarding the COVID-19 pandemic (e.g. social distancing).  This format is felt to be most appropriate for this patient at this time.  All issues noted in this document were discussed and addressed.  No physical exam was performed (except for noted visual exam findings with Video Visits).   I connected with Jordan Benson by a video enabled telemedicine application and verified that I am speaking with the correct person using two identifiers. Location patient: home Location provider: work Persons participating in the virtual visit: patient, provider  The limitations, risks, security and privacy concerns of performing an evaluation and management service by video and the availability of in person appointments have been discussed. It has also been discussed with the patient that there may be a patient responsible charge related to this service. The patient expressed understanding and agreed to proceed.   Reason for visit: work in appt  HPI: Work in - recently voluntarily admitted herself to [facility] for alcohol detox.  She had noticed that she was having more bad days. Leaving work earlier.  Would go home and drink and "pass out".  Drank to "get numb".  Was "sadder"/crying.  Last weekend, she stayed in and was "sipping all day".  Called a relative - who is a Designer, jewellery.  Admitted herself to [facility] on Monday 05/06/20.  Underwent detox.  Has not had anything to drink since Sunday night (05/05/20).  Was discharged today.  While in rehab, they had mentioned antabuse.  Did not discharge her with any medication.  Sates she was instructed to call PCP for rx.  She is planning to see a psychiatrist in [facility] Monday 05/13/20.  Planning on going to [employer].  Discussed her stay in rehab. Discussed  her plans now that she is home.  Has supportive friends and family.  Discussed treatment options.    ROS: See pertinent positives and negatives per HPI.  Past Medical History:  Diagnosis Date  . Anxiety   . Breast cancer ([place]) 12/2017   hyperplasia only not cancer per pt  . Complication of anesthesia    nausea  . GERD (gastroesophageal reflux disease)   . Headache    optical migraines  . Hyperlipidemia   . Hypertension   . IBS (irritable bowel syndrome)   . Melanoma ([place]) 2019   followed by Dr Evorn Gong, on left arm  . PONV (postoperative nausea and vomiting)   . Vitamin D deficiency     Past Surgical History:  Procedure Laterality Date  . ABDOMINAL HYSTERECTOMY  1992  . BREAST BIOPSY Left 12/21/2017   affirm bx of asymmetry, ATYPICAL DUCTAL PAPILLARY LESION WITH ADJACENT ATYPICAL DUCTAL HYPERPLASIA.   Marland Kitchen BREAST BIOPSY Left 12/21/2017   [place] bx of LN, benign  . BREAST LUMPECTOMY Left 01/05/2018   ADH  . BREAST LUMPECTOMY Left 01/12/2018   neg  . BREAST LUMPECTOMY WITH NEEDLE LOCALIZATION Left 01/05/2018   Procedure: BREAST LUMPECTOMY WITH NEEDLE LOCALIZATION;  Surgeon: Olean Ree, MD;  Location: [facility] ORS;  Service: General;  Laterality: Left;  . BREAST SURGERY    . COLONOSCOPY  2001, 2016   x2 ARM and at [place]  . JOINT REPLACEMENT Right 10/2017   TKR  . MOHS SURGERY  2012  . OVARIAN CYST REMOVAL  1991  . POLYPECTOMY    .  RE-EXCISION OF BREAST LUMPECTOMY Left 01/12/2018   Procedure: RE-EXCISION OF BREAST LUMPECTOMY;  Surgeon: Olean Ree, MD;  Location: [facility] ORS;  Service: General;  Laterality: Left;  . SEPTOPLASTY  2009  . TOTAL KNEE 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   Procedure: RIGHT TOTAL KNEE ARTHROPLASTY;  Surgeon: Sydnee Cabal, MD;  Location: [facility]S;  Service: Orthopedics;  Laterality: Right;  2 hrs  . TUBAL LIGATION  1985  . tummy tuck  1995  . WISDOM TOOTH EXTRACTION      Family History  Problem Relation Age of Onset  . Cancer Mother 56       breast  cancer  . Heart disease Mother   . Hypertension Mother   . Diabetes Mother   . Breast cancer Mother 62  . Heart disease Father   . Breast cancer Sister 82       on HRT  . Colon polyps Sister   . Colon cancer Neg Hx   . Esophageal cancer Neg Hx   . Rectal cancer Neg Hx   . Stomach cancer Neg Hx     SOCIAL HX: reviewed.    Current Outpatient Medications:  .  amLODipine (NORVASC) 10 MG tablet, TAKE 1 TABLET EVERY DAY, Disp: 90 tablet, Rfl: 1 .  anastrozole (ARIMIDEX) 1 MG tablet, TAKE 1 TABLET EVERY DAY, Disp: 90 tablet, Rfl: 1 .  calcium elemental as carbonate (TUMS ULTRA 1000) 400 MG chewable tablet, Chew 1,000 mg by mouth daily as needed for heartburn. , Disp: , Rfl:  .  CALCIUM-VITAMIN D PO, Take by mouth., Disp: , Rfl:  .  enalapril (VASOTEC) 10 MG tablet, TAKE 1 TABLET(10 MG) BY MOUTH TWICE DAILY, Disp: 180 tablet, Rfl: 1 .  exemestane (AROMASIN) 25 MG tablet, Take 25 mg by mouth daily after breakfast., Disp: , Rfl:  .  FLUoxetine (PROZAC) 40 MG capsule, TAKE 1 CAPSULE EVERY DAY, Disp: 90 capsule, Rfl: 1 .  Multiple Vitamins-Calcium (ONE-A-DAY WOMENS PO), Take 1 tablet by mouth daily., Disp: , Rfl:  .  mupirocin ointment (BACTROBAN) 2 %, Apply 1 application topically 2 (two) times daily., Disp: 22 g, Rfl: 0 .  naltrexone (DEPADE) 50 MG tablet, Take 1 tablet (50 mg total) by mouth at bedtime., Disp: 3 tablet, Rfl: 0 .  rosuvastatin (CRESTOR) 10 MG tablet, TAKE 1 TABLET EVERY DAY, Disp: 90 tablet, Rfl: 1 .  triamterene-hydrochlorothiazide (MAXZIDE-25) 37.5-25 MG tablet, TAKE 1 TABLET EVERY DAY, Disp: 90 tablet, Rfl: 1  EXAM:  GENERAL: alert, oriented, appears well and in no acute distress  HEENT: atraumatic, conjunttiva clear, no obvious abnormalities on inspection of external nose and ears  NECK: normal movements of the head and neck  LUNGS: on inspection no signs of respiratory distress, breathing rate appears normal, no obvious gross SOB, gasping or wheezing  CV: no  obvious cyanosis  PSYCH/NEURO: pleasant and cooperative, no obvious depression or anxiety, speech and thought processing grossly intact  ASSESSMENT AND PLAN:  Discussed the following assessment and plan:  Problem List Items Addressed This Visit    Alcohol abuse    Recent admitted for alcohol detox.  Discharged today.  Has planned f/u with psychiatry Monday.  Desires to have medication to help her get through the weekend.  Has good family support.  Also planning to attend AA.  Discussed treatment options.  Discussed using naltrexone instead of antabuse.  She is agreeable.  Plans to call her family to stay with her.  rx sent in for naltrexone x 3 days with plans  for f/u as scheduled.  She is comfortable with this plan.           I discussed the assessment and treatment plan with the patient. The patient was provided an opportunity to ask questions and all were answered. The patient agreed with the plan and demonstrated an understanding of the instructions.   The patient was advised to call back or seek an in-person evaluation if the symptoms worsen or if the condition fails to improve as anticipated.    Einar Pheasant, MD

## 2020-05-11 ENCOUNTER — Encounter: Payer: Self-pay | Admitting: Internal Medicine

## 2020-05-11 DIAGNOSIS — F1021 Alcohol dependence, in remission: Secondary | ICD-10-CM | POA: Insufficient documentation

## 2020-05-11 DIAGNOSIS — F1010 Alcohol abuse, uncomplicated: Secondary | ICD-10-CM | POA: Insufficient documentation

## 2020-05-11 NOTE — Assessment & Plan Note (Signed)
Recent admitted for alcohol detox.  Discharged today.  Has planned f/u with psychiatry Monday.  Desires to have medication to help her get through the weekend.  Has good family support.  Also planning to attend AA.  Discussed treatment options.  Discussed using naltrexone instead of antabuse.  She is agreeable.  Plans to call her family to stay with her.  rx sent in for naltrexone x 3 days with plans for f/u as scheduled.  She is comfortable with this plan.

## 2020-05-13 DIAGNOSIS — F339 Major depressive disorder, recurrent, unspecified: Secondary | ICD-10-CM | POA: Diagnosis not present

## 2020-05-13 DIAGNOSIS — F1020 Alcohol dependence, uncomplicated: Secondary | ICD-10-CM | POA: Diagnosis not present

## 2020-05-13 DIAGNOSIS — F411 Generalized anxiety disorder: Secondary | ICD-10-CM | POA: Diagnosis not present

## 2020-05-13 DIAGNOSIS — Z79899 Other long term (current) drug therapy: Secondary | ICD-10-CM | POA: Diagnosis not present

## 2020-05-25 DIAGNOSIS — W19XXXA Unspecified fall, initial encounter: Secondary | ICD-10-CM | POA: Diagnosis not present

## 2020-05-25 DIAGNOSIS — S63502A Unspecified sprain of left wrist, initial encounter: Secondary | ICD-10-CM | POA: Diagnosis not present

## 2020-07-17 DIAGNOSIS — H5340 Unspecified visual field defects: Secondary | ICD-10-CM | POA: Diagnosis not present

## 2020-07-17 DIAGNOSIS — H02831 Dermatochalasis of right upper eyelid: Secondary | ICD-10-CM | POA: Diagnosis not present

## 2020-07-17 DIAGNOSIS — J329 Chronic sinusitis, unspecified: Secondary | ICD-10-CM | POA: Diagnosis not present

## 2020-07-17 DIAGNOSIS — G4730 Sleep apnea, unspecified: Secondary | ICD-10-CM | POA: Diagnosis not present

## 2020-07-17 DIAGNOSIS — L987 Excessive and redundant skin and subcutaneous tissue: Secondary | ICD-10-CM | POA: Diagnosis not present

## 2020-07-17 DIAGNOSIS — H02834 Dermatochalasis of left upper eyelid: Secondary | ICD-10-CM | POA: Diagnosis not present

## 2020-07-17 DIAGNOSIS — I10 Essential (primary) hypertension: Secondary | ICD-10-CM | POA: Diagnosis not present

## 2020-07-17 DIAGNOSIS — E669 Obesity, unspecified: Secondary | ICD-10-CM | POA: Diagnosis not present

## 2020-07-17 DIAGNOSIS — J309 Allergic rhinitis, unspecified: Secondary | ICD-10-CM | POA: Diagnosis not present

## 2020-07-24 ENCOUNTER — Other Ambulatory Visit: Payer: Self-pay

## 2020-07-24 ENCOUNTER — Other Ambulatory Visit: Payer: Self-pay | Admitting: Internal Medicine

## 2020-07-24 ENCOUNTER — Encounter: Payer: Self-pay | Admitting: Internal Medicine

## 2020-07-24 DIAGNOSIS — N6092 Unspecified benign mammary dysplasia of left breast: Secondary | ICD-10-CM

## 2020-07-24 DIAGNOSIS — Z79811 Long term (current) use of aromatase inhibitors: Secondary | ICD-10-CM

## 2020-07-29 ENCOUNTER — Other Ambulatory Visit: Payer: Self-pay | Admitting: Internal Medicine

## 2020-08-14 ENCOUNTER — Other Ambulatory Visit: Payer: Self-pay | Admitting: Internal Medicine

## 2020-09-16 ENCOUNTER — Encounter: Payer: Self-pay | Admitting: Internal Medicine

## 2020-09-19 DIAGNOSIS — I499 Cardiac arrhythmia, unspecified: Secondary | ICD-10-CM | POA: Diagnosis not present

## 2020-09-19 DIAGNOSIS — F411 Generalized anxiety disorder: Secondary | ICD-10-CM | POA: Diagnosis not present

## 2020-09-19 DIAGNOSIS — I10 Essential (primary) hypertension: Secondary | ICD-10-CM | POA: Diagnosis not present

## 2020-09-19 DIAGNOSIS — K219 Gastro-esophageal reflux disease without esophagitis: Secondary | ICD-10-CM | POA: Diagnosis not present

## 2020-09-19 DIAGNOSIS — F1021 Alcohol dependence, in remission: Secondary | ICD-10-CM | POA: Diagnosis not present

## 2020-09-19 DIAGNOSIS — H269 Unspecified cataract: Secondary | ICD-10-CM | POA: Diagnosis not present

## 2020-09-19 DIAGNOSIS — E669 Obesity, unspecified: Secondary | ICD-10-CM | POA: Diagnosis not present

## 2020-09-19 DIAGNOSIS — F3342 Major depressive disorder, recurrent, in full remission: Secondary | ICD-10-CM | POA: Diagnosis not present

## 2020-09-19 DIAGNOSIS — E785 Hyperlipidemia, unspecified: Secondary | ICD-10-CM | POA: Diagnosis not present

## 2020-09-30 ENCOUNTER — Telehealth: Payer: Self-pay | Admitting: Internal Medicine

## 2020-09-30 NOTE — Telephone Encounter (Signed)
Patient scheduled for follow up 12/12/20. Last labs done 03/2020.   Patient wanting to know if she needs labs done before this appointment. Please advise

## 2020-10-01 NOTE — Telephone Encounter (Signed)
Spoke with patient, since she is going to be coming in at 7:30 am she would prefer to do labs while she is here for appt

## 2020-10-25 DIAGNOSIS — Z23 Encounter for immunization: Secondary | ICD-10-CM | POA: Diagnosis not present

## 2020-10-30 DIAGNOSIS — Z85820 Personal history of malignant melanoma of skin: Secondary | ICD-10-CM | POA: Diagnosis not present

## 2020-10-30 DIAGNOSIS — Z86006 Personal history of melanoma in-situ: Secondary | ICD-10-CM | POA: Diagnosis not present

## 2020-10-30 DIAGNOSIS — L908 Other atrophic disorders of skin: Secondary | ICD-10-CM | POA: Diagnosis not present

## 2020-10-30 DIAGNOSIS — L821 Other seborrheic keratosis: Secondary | ICD-10-CM | POA: Diagnosis not present

## 2020-10-30 DIAGNOSIS — L218 Other seborrheic dermatitis: Secondary | ICD-10-CM | POA: Diagnosis not present

## 2020-10-30 DIAGNOSIS — Z85828 Personal history of other malignant neoplasm of skin: Secondary | ICD-10-CM | POA: Diagnosis not present

## 2020-10-30 DIAGNOSIS — L570 Actinic keratosis: Secondary | ICD-10-CM | POA: Diagnosis not present

## 2020-11-07 DIAGNOSIS — H2511 Age-related nuclear cataract, right eye: Secondary | ICD-10-CM | POA: Diagnosis not present

## 2020-11-07 DIAGNOSIS — Z01818 Encounter for other preprocedural examination: Secondary | ICD-10-CM | POA: Diagnosis not present

## 2020-11-13 ENCOUNTER — Ambulatory Visit: Payer: Self-pay

## 2020-11-13 ENCOUNTER — Other Ambulatory Visit: Payer: Self-pay

## 2020-11-13 DIAGNOSIS — Z23 Encounter for immunization: Secondary | ICD-10-CM

## 2020-12-03 ENCOUNTER — Other Ambulatory Visit: Payer: Self-pay | Admitting: Internal Medicine

## 2020-12-11 ENCOUNTER — Other Ambulatory Visit: Payer: Self-pay | Admitting: Internal Medicine

## 2020-12-12 ENCOUNTER — Encounter: Payer: Self-pay | Admitting: Internal Medicine

## 2020-12-12 ENCOUNTER — Other Ambulatory Visit: Payer: Self-pay

## 2020-12-12 ENCOUNTER — Ambulatory Visit (INDEPENDENT_AMBULATORY_CARE_PROVIDER_SITE_OTHER): Payer: Medicare PPO | Admitting: Internal Medicine

## 2020-12-12 VITALS — BP 118/78 | HR 75 | Temp 97.9°F | Resp 16 | Ht 68.0 in | Wt 205.5 lb

## 2020-12-12 DIAGNOSIS — R7989 Other specified abnormal findings of blood chemistry: Secondary | ICD-10-CM | POA: Diagnosis not present

## 2020-12-12 DIAGNOSIS — I10 Essential (primary) hypertension: Secondary | ICD-10-CM | POA: Diagnosis not present

## 2020-12-12 DIAGNOSIS — E7800 Pure hypercholesterolemia, unspecified: Secondary | ICD-10-CM | POA: Diagnosis not present

## 2020-12-12 DIAGNOSIS — N6092 Unspecified benign mammary dysplasia of left breast: Secondary | ICD-10-CM | POA: Diagnosis not present

## 2020-12-12 DIAGNOSIS — Z8371 Family history of colonic polyps: Secondary | ICD-10-CM

## 2020-12-12 DIAGNOSIS — Z85820 Personal history of malignant melanoma of skin: Secondary | ICD-10-CM

## 2020-12-12 DIAGNOSIS — R739 Hyperglycemia, unspecified: Secondary | ICD-10-CM

## 2020-12-12 DIAGNOSIS — F419 Anxiety disorder, unspecified: Secondary | ICD-10-CM

## 2020-12-12 LAB — BASIC METABOLIC PANEL
BUN: 18 mg/dL (ref 6–23)
CO2: 28 mEq/L (ref 19–32)
Calcium: 9.5 mg/dL (ref 8.4–10.5)
Chloride: 103 mEq/L (ref 96–112)
Creatinine, Ser: 0.81 mg/dL (ref 0.40–1.20)
GFR: 75.37 mL/min (ref >60.00)
Glucose, Bld: 101 mg/dL — ABNORMAL HIGH (ref 70–99)
Potassium: 3.5 mEq/L (ref 3.5–5.1)
Sodium: 141 mEq/L (ref 135–145)

## 2020-12-12 LAB — CBC WITH DIFFERENTIAL/PLATELET
Basophils Absolute: 0.1 10*3/uL (ref 0.0–0.1)
Basophils Relative: 1 % (ref 0.0–3.0)
Eosinophils Absolute: 0.2 10*3/uL (ref 0.0–0.7)
Eosinophils Relative: 2.6 % (ref 0.0–5.0)
HCT: 40.2 % (ref 36.0–46.0)
Hemoglobin: 13.7 g/dL (ref 12.0–15.0)
Lymphocytes Relative: 30.3 % (ref 12.0–46.0)
Lymphs Abs: 1.9 10*3/uL (ref 0.7–4.0)
MCHC: 34.2 g/dL (ref 30.0–36.0)
MCV: 89 fl (ref 78.0–100.0)
Monocytes Absolute: 0.4 10*3/uL (ref 0.1–1.0)
Monocytes Relative: 6 % (ref 3.0–12.0)
Neutro Abs: 3.8 10*3/uL (ref 1.4–7.7)
Neutrophils Relative %: 60.1 % (ref 43.0–77.0)
Platelets: 307 10*3/uL (ref 150.0–400.0)
RBC: 4.51 Mil/uL (ref 3.87–5.11)
RDW: 12 % (ref 11.5–15.5)
WBC: 6.3 10*3/uL (ref 4.0–10.5)

## 2020-12-12 LAB — LIPID PANEL
Cholesterol: 137 mg/dL (ref 0–200)
HDL: 50.9 mg/dL (ref >39.00)
LDL Cholesterol: 60 mg/dL (ref 0–99)
NonHDL: 86.2
Total CHOL/HDL Ratio: 3
Triglycerides: 129 mg/dL (ref 0.0–149.0)
VLDL: 25.8 mg/dL (ref 0.0–40.0)

## 2020-12-12 LAB — TSH: TSH: 2.25 u[IU]/mL (ref 0.35–5.50)

## 2020-12-12 LAB — HEPATIC FUNCTION PANEL
ALT: 16 U/L (ref 0–35)
AST: 19 U/L (ref 0–37)
Albumin: 4.3 g/dL (ref 3.5–5.2)
Alkaline Phosphatase: 71 U/L (ref 39–117)
Bilirubin, Direct: 0.2 mg/dL (ref 0.0–0.3)
Total Bilirubin: 1.4 mg/dL — ABNORMAL HIGH (ref 0.2–1.2)
Total Protein: 7 g/dL (ref 6.0–8.3)

## 2020-12-12 LAB — HEMOGLOBIN A1C: Hgb A1c MFr Bld: 5.4 % (ref 4.6–6.5)

## 2020-12-12 NOTE — Progress Notes (Signed)
Patient ID: Jordan Benson, female   DOB: [birth year], 65 y.o.   MRN: [id number]   Subjective:    Patient ID: Jordan Benson, female    DOB: Jul 01, 1955, 65 y.o.   MRN: [id number]  This visit occurred during the SARS-CoV-2 public health emergency.  Safety protocols were in place, including screening questions prior to the visit, additional usage of staff PPE, and extensive cleaning of exam room while observing appropriate contact time as indicated for disinfecting solutions.   Patient here for  Chief Complaint  Patient presents with   Follow-up    .   HPI Here to follow up regarding her blood pressure and cholesterol.  Stopped drinking in 04/2020 has done well.  Feels better.  No chest pain or sob reported.  No acid reflux reported.  No abdominal pain.  Bowels moving.  Scheduled for mammogram and bone density 01/16/21.     Past Medical History:  Diagnosis Date   Anxiety    Breast cancer ([place]) 12/2017   hyperplasia only not cancer per pt   Complication of anesthesia    nausea   GERD (gastroesophageal reflux disease)    Headache    optical migraines   Hyperlipidemia    Hypertension    IBS (irritable bowel syndrome)    Melanoma ([place]) 2019   followed by Dr Evorn Gong, on left arm   PONV (postoperative nausea and vomiting)    Vitamin D deficiency    Past Surgical History:  Procedure Laterality Date   ABDOMINAL HYSTERECTOMY  1992   BREAST BIOPSY Left 12/21/2017   affirm bx of asymmetry, ATYPICAL DUCTAL PAPILLARY LESION WITH ADJACENT ATYPICAL DUCTAL HYPERPLASIA.    BREAST BIOPSY Left 12/21/2017   [place] bx of LN, benign   BREAST LUMPECTOMY Left 01/05/2018   ADH   BREAST LUMPECTOMY Left 01/12/2018   neg   BREAST LUMPECTOMY WITH NEEDLE LOCALIZATION Left 01/05/2018   Procedure: BREAST LUMPECTOMY WITH NEEDLE LOCALIZATION;  Surgeon: Olean Ree, MD;  Location: [facility] ORS;  Service: General;  Laterality: Left;   BREAST SURGERY     COLONOSCOPY  2001, 2016   x2 ARM and at [place] Right 10/2017   TKR   MOHS SURGERY  2012   OVARIAN CYST REMOVAL  1991   POLYPECTOMY     RE-EXCISION OF BREAST LUMPECTOMY Left 01/12/2018   Procedure: RE-EXCISION OF BREAST LUMPECTOMY;  Surgeon: Olean Ree, MD;  Location: [facility] ORS;  Service: General;  Laterality: Left;   SEPTOPLASTY  2009   TOTAL KNEE 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   Procedure: RIGHT TOTAL KNEE ARTHROPLASTY;  Surgeon: Sydnee Cabal, MD;  Location: [facility];  Service: Orthopedics;  Laterality: Right;  2 hrs   TUBAL LIGATION  1985   tummy tuck  1995   WISDOM TOOTH EXTRACTION     Family History  Problem Relation Age of Onset   Cancer Mother 27       breast cancer   Heart disease Mother    Hypertension Mother    Diabetes Mother    Breast cancer Mother 56   Heart disease Father    Breast cancer Sister 81       on HRT   Colon polyps Sister    Colon cancer Neg Hx    Esophageal cancer Neg Hx    Rectal cancer Neg Hx    Stomach cancer Neg Hx    Social History   Socioeconomic History   Marital status: Widowed    Spouse name: Iona Beard  Number of children: 2   Years of education: Not on file   Highest education level: Not on file  Occupational History    Employer: [employer]  Tobacco Use   Smoking status: Former    Packs/day: 0.50    Years: 10.00    Pack years: 5.00    Types: Cigarettes    Quit date: 03/12/2000    Years since quitting: 20.7   Smokeless tobacco: Never  Vaping Use   Vaping Use: Never used  Substance and Sexual Activity   Alcohol use: Yes    Alcohol/week: 14.0 standard drinks    Types: 14 Glasses of wine per week    Comment: wine daily   Drug use: Yes    Comment: gummies   Sexual activity: Not Currently  Other Topics Concern   Not on file  Social History Narrative   Not on file   Social Determinants of Health   Financial Resource Strain: Low Risk    Difficulty of Paying Living Expenses: Not hard at all  Food Insecurity: No Food Insecurity   Worried About Charity fundraiser in  the Last Year: Never true   Ran Out of Food in the Last Year: Never true  Transportation Needs: No Transportation Needs   Lack of Transportation (Medical): No   Lack of Transportation (Non-Medical): No  Physical Activity: Not on file  Stress: No Stress Concern Present   Feeling of Stress : Not at all  Social Connections: Unknown   Frequency of Communication with Friends and Family: More than three times a week   Frequency of Social Gatherings with Friends and Family: More than three times a week   Attends Religious Services: Not on file   Active Member of Clubs or Organizations: Not on file   Attends Archivist Meetings: Not on file   Marital Status: Widowed     Review of Systems  Constitutional:  Negative for appetite change and unexpected weight change.  HENT:  Negative for congestion and sinus pressure.   Respiratory:  Negative for cough, chest tightness and shortness of breath.   Cardiovascular:  Negative for chest pain and palpitations.  Gastrointestinal:  Negative for abdominal pain, diarrhea, nausea and vomiting.  Genitourinary:  Negative for difficulty urinating and dysuria.  Musculoskeletal:  Negative for joint swelling and myalgias.  Skin:  Negative for color change and rash.  Neurological:  Negative for dizziness, light-headedness and headaches.  Psychiatric/Behavioral:  Negative for agitation and dysphoric mood.       Objective:     BP 118/78   Pulse 75   Temp 97.9 F (36.6 C) (Oral)   Resp 16   Ht _0  (1.727 m)   Wt 205 lb 8 oz (93.2 kg)   SpO2 98%   BMI 31.25 kg/m  Wt Readings from Last 3 Encounters:  12/12/20 205 lb 8 oz (93.2 kg)  05/10/20 205 lb (93 kg)  04/02/20 203 lb 3.2 oz (92.2 kg)    Physical Exam Vitals reviewed.  Constitutional:      General: She is not in acute distress.    Appearance: Normal appearance.  HENT:     Head: Normocephalic and atraumatic.     Right Ear: External ear normal.     Left Ear: External ear normal.   Eyes:     General: No scleral icterus.       Right eye: No discharge.        Left eye: No discharge.     Conjunctiva/sclera:  Conjunctivae normal.  Neck:     Thyroid: No thyromegaly.  Cardiovascular:     Rate and Rhythm: Normal rate and regular rhythm.  Pulmonary:     Effort: No respiratory distress.     Breath sounds: Normal breath sounds. No wheezing.  Abdominal:     General: Bowel sounds are normal.     Palpations: Abdomen is soft.     Tenderness: There is no abdominal tenderness.  Musculoskeletal:        General: No swelling or tenderness.     Cervical back: Neck supple. No tenderness.  Lymphadenopathy:     Cervical: No cervical adenopathy.  Skin:    Findings: No erythema or rash.  Neurological:     Mental Status: She is alert.  Psychiatric:        Mood and Affect: Mood normal.        Behavior: Behavior normal.     Outpatient Encounter Medications as of 12/12/2020  Medication Sig   amLODipine (NORVASC) 10 MG tablet TAKE 1 TABLET EVERY DAY   anastrozole (ARIMIDEX) 1 MG tablet TAKE 1 TABLET EVERY DAY   CALCIUM-VITAMIN D PO Take by mouth.   enalapril (VASOTEC) 10 MG tablet TAKE 1 TABLET TWICE DAILY   FLUoxetine (PROZAC) 40 MG capsule TAKE 1 CAPSULE EVERY DAY   Multiple Vitamins-Calcium (ONE-A-DAY WOMENS PO) Take 1 tablet by mouth daily.   rosuvastatin (CRESTOR) 10 MG tablet TAKE 1 TABLET EVERY DAY   triamterene-hydrochlorothiazide (MAXZIDE-25) 37.5-25 MG tablet TAKE 1 TABLET EVERY DAY   [DISCONTINUED] calcium elemental as carbonate (TUMS ULTRA 1000) 400 MG chewable tablet Chew 1,000 mg by mouth daily as needed for heartburn.    [DISCONTINUED] mupirocin ointment (BACTROBAN) 2 % Apply 1 application topically 2 (two) times daily.   [DISCONTINUED] naltrexone (DEPADE) 50 MG tablet Take 1 tablet (50 mg total) by mouth at bedtime.   No facility-administered encounter medications on file as of 12/12/2020.     Lab Results  Component Value Date   WBC 5.7 03/29/2020   HGB 14.0  03/29/2020   HCT 41.2 03/29/2020   PLT 285.0 03/29/2020   GLUCOSE 101 (H) 03/29/2020   CHOL 148 03/29/2020   TRIG 121.0 03/29/2020   HDL 59.00 03/29/2020   LDLDIRECT 138.0 03/20/2019   LDLCALC 65 03/29/2020   ALT 29 03/29/2020   AST 30 03/29/2020   NA 139 03/29/2020   K 3.5 03/29/2020   CL 101 03/29/2020   CREATININE 0.82 03/29/2020   BUN 20 03/29/2020   CO2 29 03/29/2020   TSH 2.18 03/29/2020   INR 0.94 08/13/2017   HGBA1C 5.5 03/29/2020    MM 3D SCREEN BREAST BILATERAL  Result Date: 01/18/2020 CLINICAL DATA:  Screening. EXAM: DIGITAL SCREENING BILATERAL MAMMOGRAM WITH TOMO AND CAD COMPARISON:  Previous exam(s). ACR Breast Density Category b: There are scattered areas of fibroglandular density. FINDINGS: There are no findings suspicious for malignancy. Images were processed with CAD. IMPRESSION: No mammographic evidence of malignancy. A result letter of this screening mammogram will be mailed directly to the patient. RECOMMENDATION: Screening mammogram in one year. (Code:SM-B-01Y) BI-RADS CATEGORY  1: Negative. Electronically Signed   By: Claudie Revering M.D.   On: 01/18/2020 12:54       Assessment & Plan:   Problem List Items Addressed This Visit     Abnormal liver function test    Has stopped drinking.  Check liver panel today.       Anxiety    Doing better.  Continue prozac. Doing well.  Atypical ductal hyperplasia of left breast    Left breast - atypical ductal hyperplasia - on arimidex.  Mammogram 01/18/20 - Birads I.  Continue f/u with hematology. Scheduled for mammogram 01/16/21.        Essential hypertension, benign    Blood pressure doing well on enalapril, triam/hctz and amlodipine.  Only taking enalapril q day. Continue current medication regimen.  Follow pressures.  Follow metabolic panel.       Relevant Orders   TSH   CBC w/Diff   Basic Metabolic Panel (BMET)   Family history of colonic polyps    Colonoscopy 12/04/19 - several polyps present and  internal hemorrhoids.  Followed by Dr Lucio Edward.       History of melanoma    Followed by dermatology.       Hypercholesterolemia    On crestor.  Low cholesterol diet and exercise.  Follow lipid panel and liver function tests.   Lab Results  Component Value Date   CHOL 148 03/29/2020   HDL 59.00 03/29/2020   LDLCALC 65 03/29/2020   LDLDIRECT 138.0 03/20/2019   TRIG 121.0 03/29/2020   CHOLHDL 3 03/29/2020       Relevant Orders   Lipid panel   Hepatic function panel   Hyperglycemia - Primary    Low carb diet and exercise.  Follow met b and a1c.   Lab Results  Component Value Date   HGBA1C 5.5 03/29/2020       Relevant Orders   HgB A1c     Einar Pheasant, MD

## 2020-12-12 NOTE — Assessment & Plan Note (Signed)
Doing better.  Continue prozac. Doing well.

## 2020-12-12 NOTE — Assessment & Plan Note (Addendum)
Blood pressure doing well on enalapril, triam/hctz and amlodipine.  Only taking enalapril q day. Continue current medication regimen.  Follow pressures.  Follow metabolic panel.

## 2020-12-12 NOTE — Assessment & Plan Note (Signed)
On crestor.  Low cholesterol diet and exercise.  Follow lipid panel and liver function tests.   Lab Results  Component Value Date   CHOL 148 03/29/2020   HDL 59.00 03/29/2020   LDLCALC 65 03/29/2020   LDLDIRECT 138.0 03/20/2019   TRIG 121.0 03/29/2020   CHOLHDL 3 03/29/2020

## 2020-12-12 NOTE — Assessment & Plan Note (Signed)
Has stopped drinking.  Check liver panel today.

## 2020-12-12 NOTE — Assessment & Plan Note (Signed)
Left breast - atypical ductal hyperplasia - on arimidex.  Mammogram 01/18/20 - Birads I.  Continue f/u with hematology. Scheduled for mammogram 01/16/21.

## 2020-12-12 NOTE — Assessment & Plan Note (Signed)
Followed by dermatology

## 2020-12-12 NOTE — Assessment & Plan Note (Signed)
Low carb diet and exercise.  Follow met b and a1c.   Lab Results  Component Value Date   HGBA1C 5.5 03/29/2020

## 2020-12-12 NOTE — Assessment & Plan Note (Signed)
Colonoscopy 12/04/19 - several polyps present and internal hemorrhoids.  Followed by Dr Malcolm Stark.  

## 2020-12-16 DIAGNOSIS — H2512 Age-related nuclear cataract, left eye: Secondary | ICD-10-CM | POA: Diagnosis not present

## 2020-12-16 DIAGNOSIS — H2511 Age-related nuclear cataract, right eye: Secondary | ICD-10-CM | POA: Diagnosis not present

## 2020-12-30 DIAGNOSIS — H2512 Age-related nuclear cataract, left eye: Secondary | ICD-10-CM | POA: Diagnosis not present

## 2020-12-30 DIAGNOSIS — H2511 Age-related nuclear cataract, right eye: Secondary | ICD-10-CM | POA: Diagnosis not present

## 2021-01-15 DIAGNOSIS — S838X2A Sprain of other specified parts of left knee, initial encounter: Secondary | ICD-10-CM | POA: Diagnosis not present

## 2021-01-15 DIAGNOSIS — M25562 Pain in left knee: Secondary | ICD-10-CM | POA: Diagnosis not present

## 2021-01-16 ENCOUNTER — Other Ambulatory Visit: Payer: Medicare PPO

## 2021-01-20 DIAGNOSIS — R262 Difficulty in walking, not elsewhere classified: Secondary | ICD-10-CM | POA: Diagnosis not present

## 2021-01-20 DIAGNOSIS — S838X2D Sprain of other specified parts of left knee, subsequent encounter: Secondary | ICD-10-CM | POA: Diagnosis not present

## 2021-01-20 DIAGNOSIS — M6281 Muscle weakness (generalized): Secondary | ICD-10-CM | POA: Diagnosis not present

## 2021-01-28 ENCOUNTER — Ambulatory Visit: Payer: Medicare PPO | Admitting: Internal Medicine

## 2021-01-28 ENCOUNTER — Encounter: Payer: Self-pay | Admitting: Internal Medicine

## 2021-01-28 ENCOUNTER — Other Ambulatory Visit: Payer: Self-pay

## 2021-01-28 VITALS — BP 124/72 | HR 83 | Temp 97.9°F | Resp 16 | Ht 68.0 in | Wt 207.0 lb

## 2021-01-28 DIAGNOSIS — R739 Hyperglycemia, unspecified: Secondary | ICD-10-CM

## 2021-01-28 DIAGNOSIS — M7022 Olecranon bursitis, left elbow: Secondary | ICD-10-CM | POA: Diagnosis not present

## 2021-01-28 DIAGNOSIS — N393 Stress incontinence (female) (male): Secondary | ICD-10-CM | POA: Diagnosis not present

## 2021-01-28 DIAGNOSIS — E7800 Pure hypercholesterolemia, unspecified: Secondary | ICD-10-CM

## 2021-01-28 DIAGNOSIS — M7020 Olecranon bursitis, unspecified elbow: Secondary | ICD-10-CM | POA: Insufficient documentation

## 2021-01-28 DIAGNOSIS — M25562 Pain in left knee: Secondary | ICD-10-CM | POA: Diagnosis not present

## 2021-01-28 DIAGNOSIS — I10 Essential (primary) hypertension: Secondary | ICD-10-CM

## 2021-01-28 DIAGNOSIS — F419 Anxiety disorder, unspecified: Secondary | ICD-10-CM | POA: Diagnosis not present

## 2021-01-28 NOTE — Assessment & Plan Note (Signed)
Evaluated recently.  S/p xrays.  Wearing a support.  Gentle use of antiinflammatory and tylenol.  Follow.  No increased swelling or redness.

## 2021-01-28 NOTE — Assessment & Plan Note (Signed)
Exam c/w olecranon bursitis.  No pain.  Good rom.  No redness.  Discussed avoiding applying direct pressure.  Avoid overuse and strenuous activity.  Gentle use of antiinflammatories.  Follow.

## 2021-01-28 NOTE — Assessment & Plan Note (Signed)
Blood pressure doing well on enalapril, triam/hctz and amlodipine.  Only taking enalapril q day. Continue current medication regimen.  Follow pressures.  Follow metabolic panel.

## 2021-01-28 NOTE — Progress Notes (Signed)
Patient ID: Jordan Benson, female   DOB: [birth year], 65 y.o.   MRN: [id number]   Subjective:    Patient ID: Jordan Benson, female    DOB: 1956/02/03, 65 y.o.   MRN: [id number]  This visit occurred during the SARS-CoV-2 public health emergency.  Safety protocols were in place, including screening questions prior to the visit, additional usage of staff PPE, and extensive cleaning of exam room while observing appropriate contact time as indicated for disinfecting solutions.   Patient here for work in Psychologist, educational Complaint  Patient presents with   arm infection   .   HPI Work in with concerns regarding - swelling - left elbow.  Not sure when started swelling, stating someone else noticed. No pain.  No limited rom.  No redness or increased warmth.  She also reports some left knee pain.  Was evaluated at [facility].  Has been doing a lot of climbing stairs, hanging clothes, etc.  Wearing a knee support.  Taking a prescription antiinflammatory.  Doing well otherwise.  No chest pain or sob reported.  No abdominal pain or bowel change reported.  No acid reflux reported.  She does report having some issues with what sounds like stress incontinence.  Planning to leave this weekend to go go [place]. No alcohol since March.  Doing well.    Past Medical History:  Diagnosis Date   Anxiety    Breast cancer ([place]) 12/2017   hyperplasia only not cancer per pt   Complication of anesthesia    nausea   GERD (gastroesophageal reflux disease)    Headache    optical migraines   Hyperlipidemia    Hypertension    IBS (irritable bowel syndrome)    Melanoma ([place]) 2019   followed by Dr Evorn Gong, on left arm   PONV (postoperative nausea and vomiting)    Vitamin D deficiency    Past Surgical History:  Procedure Laterality Date   ABDOMINAL HYSTERECTOMY  1992   BREAST BIOPSY Left 12/21/2017   affirm bx of asymmetry, ATYPICAL DUCTAL PAPILLARY LESION WITH ADJACENT ATYPICAL DUCTAL HYPERPLASIA.    BREAST BIOPSY Left  12/21/2017   [place] bx of LN, benign   BREAST LUMPECTOMY Left 01/05/2018   ADH   BREAST LUMPECTOMY Left 01/12/2018   neg   BREAST LUMPECTOMY WITH NEEDLE LOCALIZATION Left 01/05/2018   Procedure: BREAST LUMPECTOMY WITH NEEDLE LOCALIZATION;  Surgeon: Olean Ree, MD;  Location: [facility] ORS;  Service: General;  Laterality: Left;   BREAST SURGERY     COLONOSCOPY  2001, 2016   x2 ARM and at [place] Right 10/2017   TKR   MOHS SURGERY  2012   OVARIAN CYST REMOVAL  1991   POLYPECTOMY     RE-EXCISION OF BREAST LUMPECTOMY Left 01/12/2018   Procedure: RE-EXCISION OF BREAST LUMPECTOMY;  Surgeon: Olean Ree, MD;  Location: [facility] ORS;  Service: General;  Laterality: Left;   SEPTOPLASTY  2009   TOTAL KNEE 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   Procedure: RIGHT TOTAL KNEE ARTHROPLASTY;  Surgeon: Sydnee Cabal, MD;  Location: [facility];  Service: Orthopedics;  Laterality: Right;  2 hrs   TUBAL LIGATION  1985   tummy tuck  1995   WISDOM TOOTH EXTRACTION     Family History  Problem Relation Age of Onset   Cancer Mother 74       breast cancer   Heart disease Mother    Hypertension Mother    Diabetes Mother    Breast cancer Mother 70  Heart disease Father    Breast cancer Sister 57       on HRT   Colon polyps Sister    Colon cancer Neg Hx    Esophageal cancer Neg Hx    Rectal cancer Neg Hx    Stomach cancer Neg Hx    Social History   Socioeconomic History   Marital status: Widowed    Spouse name: Iona Beard   Number of children: 2   Years of education: Not on file   Highest education level: Not on file  Occupational History    Employer: [employer]  Tobacco Use   Smoking status: Former    Packs/day: 0.50    Years: 10.00    Pack years: 5.00    Types: Cigarettes    Quit date: 03/12/2000    Years since quitting: 20.8   Smokeless tobacco: Never  Vaping Use   Vaping Use: Never used  Substance and Sexual Activity   Alcohol use: Yes    Alcohol/week: 14.0 standard drinks    Types: 14  Glasses of wine per week    Comment: wine daily   Drug use: Yes    Comment: gummies   Sexual activity: Not Currently  Other Topics Concern   Not on file  Social History Narrative   Not on file   Social Determinants of Health   Financial Resource Strain: Low Risk    Difficulty of Paying Living Expenses: Not hard at all  Food Insecurity: No Food Insecurity   Worried About Charity fundraiser in the Last Year: Never true   Ran Out of Food in the Last Year: Never true  Transportation Needs: No Transportation Needs   Lack of Transportation (Medical): No   Lack of Transportation (Non-Medical): No  Physical Activity: Not on file  Stress: No Stress Concern Present   Feeling of Stress : Not at all  Social Connections: Unknown   Frequency of Communication with Friends and Family: More than three times a week   Frequency of Social Gatherings with Friends and Family: More than three times a week   Attends Religious Services: Not on file   Active Member of Clubs or Organizations: Not on file   Attends Archivist Meetings: Not on file   Marital Status: Widowed     Review of Systems  Constitutional:  Negative for appetite change and unexpected weight change.  HENT:  Negative for congestion and sinus pressure.   Respiratory:  Negative for cough, chest tightness and shortness of breath.   Cardiovascular:  Negative for chest pain, palpitations and leg swelling.  Gastrointestinal:  Negative for abdominal pain, diarrhea, nausea and vomiting.  Genitourinary:  Negative for difficulty urinating and dysuria.  Musculoskeletal:  Negative for myalgias.       Left knee pain as outlined.  Increased soft tissue - elbow.  No redness.  No pain.   Skin:  Negative for color change and rash.  Neurological:  Negative for dizziness, light-headedness and headaches.  Psychiatric/Behavioral:  Negative for agitation and dysphoric mood.       Objective:     BP 124/72    Pulse 83    Temp 97.9 F  (36.6 C)    Resp 16    Wt 207 lb (93.9 kg)    SpO2 98%    BMI 31.47 kg/m  Wt Readings from Last 3 Encounters:  01/28/21 207 lb (93.9 kg)  12/12/20 205 lb 8 oz (93.2 kg)  05/10/20 205 lb (93 kg)  Physical Exam Vitals reviewed.  Constitutional:      General: She is not in acute distress.    Appearance: Normal appearance.  HENT:     Head: Normocephalic and atraumatic.     Right Ear: External ear normal.     Left Ear: External ear normal.  Eyes:     General: No scleral icterus.       Right eye: No discharge.        Left eye: No discharge.     Conjunctiva/sclera: Conjunctivae normal.  Neck:     Thyroid: No thyromegaly.  Cardiovascular:     Rate and Rhythm: Normal rate and regular rhythm.  Pulmonary:     Effort: No respiratory distress.     Breath sounds: Normal breath sounds. No wheezing.  Abdominal:     General: Bowel sounds are normal.     Palpations: Abdomen is soft.     Tenderness: There is no abdominal tenderness.  Musculoskeletal:     Cervical back: Neck supple. No tenderness.     Comments: Minimal tenderness to palpation left anterior knee.  No increased erythema.  No increased warmth. No leg swelling.  Increased soft tissue - olecranon - c/w olecranon bursitis.  No pain.  Good rom.    Lymphadenopathy:     Cervical: No cervical adenopathy.  Skin:    Findings: No erythema or rash.  Neurological:     Mental Status: She is alert.  Psychiatric:        Mood and Affect: Mood normal.        Behavior: Behavior normal.     Outpatient Encounter Medications as of 01/28/2021  Medication Sig   amLODipine (NORVASC) 10 MG tablet TAKE 1 TABLET EVERY DAY   anastrozole (ARIMIDEX) 1 MG tablet TAKE 1 TABLET EVERY DAY   CALCIUM-VITAMIN D PO Take by mouth.   enalapril (VASOTEC) 10 MG tablet TAKE 1 TABLET TWICE DAILY (Patient taking differently: Take 10 mg by mouth daily. TAKE 1 TABLET DAILY)   FLUoxetine (PROZAC) 40 MG capsule TAKE 1 CAPSULE EVERY DAY   Multiple Vitamins-Calcium  (ONE-A-DAY WOMENS PO) Take 1 tablet by mouth daily.   rosuvastatin (CRESTOR) 10 MG tablet TAKE 1 TABLET EVERY DAY   triamterene-hydrochlorothiazide (MAXZIDE-25) 37.5-25 MG tablet TAKE 1 TABLET EVERY DAY   No facility-administered encounter medications on file as of 01/28/2021.     Lab Results  Component Value Date   WBC 6.3 12/12/2020   HGB 13.7 12/12/2020   HCT 40.2 12/12/2020   PLT 307.0 12/12/2020   GLUCOSE 101 (H) 12/12/2020   CHOL 137 12/12/2020   TRIG 129.0 12/12/2020   HDL 50.90 12/12/2020   LDLDIRECT 138.0 03/20/2019   LDLCALC 60 12/12/2020   ALT 16 12/12/2020   AST 19 12/12/2020   NA 141 12/12/2020   K 3.5 12/12/2020   CL 103 12/12/2020   CREATININE 0.81 12/12/2020   BUN 18 12/12/2020   CO2 28 12/12/2020   TSH 2.25 12/12/2020   INR 0.94 08/13/2017   HGBA1C 5.4 12/12/2020    MM 3D SCREEN BREAST BILATERAL  Result Date: 01/18/2020 CLINICAL DATA:  Screening. EXAM: DIGITAL SCREENING BILATERAL MAMMOGRAM WITH TOMO AND CAD COMPARISON:  Previous exam(s). ACR Breast Density Category b: There are scattered areas of fibroglandular density. FINDINGS: There are no findings suspicious for malignancy. Images were processed with CAD. IMPRESSION: No mammographic evidence of malignancy. A result letter of this screening mammogram will be mailed directly to the patient. RECOMMENDATION: Screening mammogram in one year. (Code:SM-B-01Y) BI-RADS  CATEGORY  1: Negative. Electronically Signed   By: Claudie Revering M.D.   On: 01/18/2020 12:54       Assessment & Plan:   Problem List Items Addressed This Visit     Anxiety    Doing better.  Continue prozac. Doing well.        Essential hypertension, benign    Blood pressure doing well on enalapril, triam/hctz and amlodipine.  Only taking enalapril q day. Continue current medication regimen.  Follow pressures.  Follow metabolic panel.       Hypercholesterolemia   Relevant Orders   Basic metabolic panel   Lipid panel   Hepatic function  panel   Hyperglycemia - Primary   Relevant Orders   Hemoglobin A1c   Left knee pain    Evaluated recently.  S/p xrays.  Wearing a support.  Gentle use of antiinflammatory and tylenol.  Follow.  No increased swelling or redness.        Olecranon bursitis    Exam c/w olecranon bursitis.  No pain.  Good rom.  No redness.  Discussed avoiding applying direct pressure.  Avoid overuse and strenuous activity.  Gentle use of antiinflammatories.  Follow.        Stress incontinence    Discussed pelvic floor therapy.  Will notify me when agreeable.         Einar Pheasant, MD

## 2021-01-28 NOTE — Assessment & Plan Note (Signed)
Doing better.  Continue prozac. Doing well.

## 2021-01-28 NOTE — Assessment & Plan Note (Signed)
Discussed pelvic floor therapy.  Will notify me when agreeable.

## 2021-02-06 ENCOUNTER — Encounter: Payer: Self-pay | Admitting: Internal Medicine

## 2021-02-06 DIAGNOSIS — S8392XA Sprain of unspecified site of left knee, initial encounter: Secondary | ICD-10-CM | POA: Diagnosis not present

## 2021-02-07 ENCOUNTER — Encounter: Payer: Self-pay | Admitting: Internal Medicine

## 2021-02-07 DIAGNOSIS — M1712 Unilateral primary osteoarthritis, left knee: Secondary | ICD-10-CM | POA: Diagnosis not present

## 2021-02-07 NOTE — Telephone Encounter (Signed)
Spoke with patient. She has an appointment with [facility] at 9:30 am this morning.

## 2021-02-11 ENCOUNTER — Telehealth: Payer: Self-pay | Admitting: Internal Medicine

## 2021-02-11 NOTE — Telephone Encounter (Signed)
Going to keep 2/16 appt

## 2021-02-11 NOTE — Telephone Encounter (Signed)
Pt wants to reschedule appt on 2-16 to give MD time to get results. Call back at [phone]

## 2021-02-12 NOTE — Telephone Encounter (Signed)
Colette took care of this.

## 2021-02-16 ENCOUNTER — Encounter: Payer: Self-pay | Admitting: Internal Medicine

## 2021-02-18 NOTE — Telephone Encounter (Signed)
Called patient to schedule appt. Pt stated that she was getting in her car and needed to think so she would call back

## 2021-02-19 DIAGNOSIS — I499 Cardiac arrhythmia, unspecified: Secondary | ICD-10-CM | POA: Diagnosis not present

## 2021-02-19 DIAGNOSIS — E785 Hyperlipidemia, unspecified: Secondary | ICD-10-CM | POA: Diagnosis not present

## 2021-02-19 DIAGNOSIS — I10 Essential (primary) hypertension: Secondary | ICD-10-CM | POA: Diagnosis not present

## 2021-02-19 DIAGNOSIS — F1021 Alcohol dependence, in remission: Secondary | ICD-10-CM | POA: Diagnosis not present

## 2021-02-19 DIAGNOSIS — F411 Generalized anxiety disorder: Secondary | ICD-10-CM | POA: Diagnosis not present

## 2021-02-19 DIAGNOSIS — G8929 Other chronic pain: Secondary | ICD-10-CM | POA: Diagnosis not present

## 2021-02-19 DIAGNOSIS — C50919 Malignant neoplasm of unspecified site of unspecified female breast: Secondary | ICD-10-CM | POA: Diagnosis not present

## 2021-02-19 DIAGNOSIS — F324 Major depressive disorder, single episode, in partial remission: Secondary | ICD-10-CM | POA: Diagnosis not present

## 2021-02-19 DIAGNOSIS — E669 Obesity, unspecified: Secondary | ICD-10-CM | POA: Diagnosis not present

## 2021-02-24 ENCOUNTER — Other Ambulatory Visit: Payer: Medicare PPO

## 2021-02-25 ENCOUNTER — Ambulatory Visit: Payer: Medicare PPO | Admitting: Internal Medicine

## 2021-03-06 ENCOUNTER — Other Ambulatory Visit: Payer: Self-pay

## 2021-03-06 ENCOUNTER — Ambulatory Visit
Admission: RE | Admit: 2021-03-06 | Discharge: 2021-03-06 | Disposition: A | Discharge status: Home / Self Care | Payer: Medicare PPO | Source: Ambulatory Visit | Attending: Internal Medicine | Admitting: Internal Medicine

## 2021-03-06 DIAGNOSIS — Z1231 Encounter for screening mammogram for malignant neoplasm of breast: Secondary | ICD-10-CM | POA: Diagnosis not present

## 2021-03-06 DIAGNOSIS — N6092 Unspecified benign mammary dysplasia of left breast: Secondary | ICD-10-CM | POA: Diagnosis not present

## 2021-03-18 DIAGNOSIS — M1712 Unilateral primary osteoarthritis, left knee: Secondary | ICD-10-CM | POA: Diagnosis not present

## 2021-03-24 DIAGNOSIS — M1712 Unilateral primary osteoarthritis, left knee: Secondary | ICD-10-CM | POA: Diagnosis not present

## 2021-03-24 DIAGNOSIS — M25562 Pain in left knee: Secondary | ICD-10-CM | POA: Diagnosis not present

## 2021-03-24 DIAGNOSIS — R262 Difficulty in walking, not elsewhere classified: Secondary | ICD-10-CM | POA: Diagnosis not present

## 2021-03-25 ENCOUNTER — Other Ambulatory Visit: Payer: Medicare PPO

## 2021-03-26 ENCOUNTER — Ambulatory Visit: Payer: Medicare PPO

## 2021-03-26 ENCOUNTER — Telehealth: Payer: Self-pay

## 2021-03-26 NOTE — Telephone Encounter (Signed)
Unable to reach patient for scheduled AWV. Confirmed with patient yesterday. No answer, left voicemail to call office back and reschedule.

## 2021-03-26 NOTE — Telephone Encounter (Signed)
Patient did cancel AWV appointment on 03/25/21.

## 2021-03-27 ENCOUNTER — Other Ambulatory Visit: Payer: Self-pay

## 2021-03-27 ENCOUNTER — Inpatient Hospital Stay: Payer: Medicare PPO | Attending: Internal Medicine | Admitting: Internal Medicine

## 2021-03-27 ENCOUNTER — Encounter: Payer: Self-pay | Admitting: Internal Medicine

## 2021-03-27 DIAGNOSIS — Z87891 Personal history of nicotine dependence: Secondary | ICD-10-CM | POA: Insufficient documentation

## 2021-03-27 DIAGNOSIS — M8580 Other specified disorders of bone density and structure, unspecified site: Secondary | ICD-10-CM | POA: Insufficient documentation

## 2021-03-27 DIAGNOSIS — M25569 Pain in unspecified knee: Secondary | ICD-10-CM | POA: Insufficient documentation

## 2021-03-27 DIAGNOSIS — Z90710 Acquired absence of both cervix and uterus: Secondary | ICD-10-CM | POA: Diagnosis not present

## 2021-03-27 DIAGNOSIS — N6092 Unspecified benign mammary dysplasia of left breast: Secondary | ICD-10-CM | POA: Diagnosis not present

## 2021-03-27 DIAGNOSIS — Z803 Family history of malignant neoplasm of breast: Secondary | ICD-10-CM | POA: Diagnosis not present

## 2021-03-27 DIAGNOSIS — I10 Essential (primary) hypertension: Secondary | ICD-10-CM | POA: Diagnosis not present

## 2021-03-27 MED ORDER — ANASTROZOLE 1 MG PO TABS: 1.0000 mg | ORAL_TABLET | Freq: Every day | ORAL | 3 refills | Status: DC

## 2021-03-27 NOTE — Progress Notes (Signed)
[facility] NOTE  Patient Care Team: Einar Pheasant, MD as PCP - General (Internal Medicine)  CHIEF COMPLAINTS/PURPOSE OF CONSULTATION: Atypical ductal hyperplasia  # LEFT BREAST ATYPICAL DUCTAL HYPERPLASIA- June 2020 started Anastraszole [declined tamoxifen because sister's intolerance or side effects]; January 2021-Arimidex   #  Oncology History   No history exists.     HISTORY OF PRESENTING ILLNESS:  Jordan Benson 66 y.o.  female with  left breast atypical ductal hyperplasia-currently on Larna Daughters is here for follow-up.  Patient complains of worsening joint pains especially knee joint.  Patient had to have cortisone injection.  She had to cut short her [place] trip.  No significant hot flashes. Review of Systems  Constitutional:  Negative for chills, diaphoresis, fever, malaise/fatigue and weight loss.  HENT:  Negative for nosebleeds and sore throat.   Eyes:  Negative for double vision.  Respiratory:  Negative for cough, hemoptysis, sputum production, shortness of breath and wheezing.   Cardiovascular:  Negative for chest pain, palpitations, orthopnea and leg swelling.  Gastrointestinal:  Negative for abdominal pain, blood in stool, constipation, diarrhea, heartburn, melena, nausea and vomiting.  Genitourinary:  Negative for dysuria, frequency and urgency.  Musculoskeletal:  Positive for joint pain. Negative for back pain.  Skin: Negative.  Negative for itching and rash.  Neurological:  Negative for dizziness, tingling, focal weakness, weakness and headaches.  Endo/Heme/Allergies:  Does not bruise/bleed easily.  Psychiatric/Behavioral:  Negative for depression. The patient is not nervous/anxious and does not have insomnia.     MEDICAL HISTORY:  Past Medical History:  Diagnosis Date   Anxiety    Breast cancer ([place]) 12/2017   hyperplasia only not cancer per pt   Complication of anesthesia    nausea   GERD (gastroesophageal reflux disease)     Headache    optical migraines   Hyperlipidemia    Hypertension    IBS (irritable bowel syndrome)    Melanoma ([place]) 2019   followed by Dr Evorn Gong, on left arm   PONV (postoperative nausea and vomiting)    Vitamin D deficiency     SURGICAL HISTORY: Past Surgical History:  Procedure Laterality Date   ABDOMINAL HYSTERECTOMY  1992   BREAST BIOPSY Left 12/21/2017   affirm bx of asymmetry, ATYPICAL DUCTAL PAPILLARY LESION WITH ADJACENT ATYPICAL DUCTAL HYPERPLASIA.    BREAST BIOPSY Left 12/21/2017   [place] bx of LN, benign   BREAST LUMPECTOMY Left 01/05/2018   ADH   BREAST LUMPECTOMY Left 01/12/2018   neg   BREAST LUMPECTOMY WITH NEEDLE LOCALIZATION Left 01/05/2018   Procedure: BREAST LUMPECTOMY WITH NEEDLE LOCALIZATION;  Surgeon: Olean Ree, MD;  Location: [facility] ORS;  Service: General;  Laterality: Left;   BREAST SURGERY     COLONOSCOPY  2001, 2016   x2 ARM and at [place] Right 10/2017   TKR   MOHS SURGERY  2012   OVARIAN CYST REMOVAL  1991   POLYPECTOMY     RE-EXCISION OF BREAST LUMPECTOMY Left 01/12/2018   Procedure: RE-EXCISION OF BREAST LUMPECTOMY;  Surgeon: Olean Ree, MD;  Location: [facility] ORS;  Service: General;  Laterality: Left;   SEPTOPLASTY  2009   TOTAL KNEE 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   Procedure: RIGHT TOTAL KNEE ARTHROPLASTY;  Surgeon: Sydnee Cabal, MD;  Location: [facility];  Service: Orthopedics;  Laterality: Right;  2 hrs   TUBAL LIGATION  1985   tummy tuck  1995   WISDOM TOOTH EXTRACTION      SOCIAL HISTORY: Social History  Socioeconomic History   Marital status: Widowed    Spouse name: Iona Beard   Number of children: 2   Years of education: Not on file   Highest education level: Not on file  Occupational History    Employer: [employer]  Tobacco Use   Smoking status: Former    Packs/day: 0.50    Years: 10.00    Pack years: 5.00    Types: Cigarettes    Quit date: 03/12/2000    Years since quitting: 21.0   Smokeless tobacco: Never  Vaping  Use   Vaping Use: Never used  Substance and Sexual Activity   Alcohol use: Yes    Alcohol/week: 14.0 standard drinks    Types: 14 Glasses of wine per week    Comment: wine daily   Drug use: Yes    Comment: gummies   Sexual activity: Not Currently  Other Topics Concern   Not on file  Social History Narrative   Not on file   Social Determinants of Health   Financial Resource Strain: Not on file  Food Insecurity: Not on file  Transportation Needs: Not on file  Physical Activity: Not on file  Stress: Not on file  Social Connections: Not on file  Intimate Partner Violence: Not on file    FAMILY HISTORY: Family History  Problem Relation Age of Onset   Cancer Mother 72       breast cancer   Heart disease Mother    Hypertension Mother    Diabetes Mother    Breast cancer Mother 77   Heart disease Father    Breast cancer Sister 44       on HRT   Colon polyps Sister    Colon cancer Neg Hx    Esophageal cancer Neg Hx    Rectal cancer Neg Hx    Stomach cancer Neg Hx     ALLERGIES:  has No Known Allergies.  MEDICATIONS:  Current Outpatient Medications  Medication Sig Dispense Refill   amLODipine (NORVASC) 10 MG tablet TAKE 1 TABLET EVERY DAY 90 tablet 1   CALCIUM-VITAMIN D PO Take by mouth.     enalapril (VASOTEC) 10 MG tablet TAKE 1 TABLET TWICE DAILY (Patient taking differently: Take 10 mg by mouth daily. TAKE 1 TABLET DAILY) 180 tablet 1   FLUoxetine (PROZAC) 40 MG capsule TAKE 1 CAPSULE EVERY DAY 90 capsule 1   Multiple Vitamins-Calcium (ONE-A-DAY WOMENS PO) Take 1 tablet by mouth daily.     rosuvastatin (CRESTOR) 10 MG tablet TAKE 1 TABLET EVERY DAY 90 tablet 1   triamterene-hydrochlorothiazide (MAXZIDE-25) 37.5-25 MG tablet TAKE 1 TABLET EVERY DAY 90 tablet 1   anastrozole (ARIMIDEX) 1 MG tablet Take 1 tablet (1 mg total) by mouth daily. 90 tablet 3   No current facility-administered medications for this visit.      Marland Kitchen  PHYSICAL EXAMINATION: ECOG PERFORMANCE  STATUS: 0 - Asymptomatic  Vitals:   03/27/21 1039  BP: 114/71  Pulse: 79  Temp: 98.2 F (36.8 C)  SpO2: 100%   Filed Weights   03/27/21 1039  Weight: 206 lb 9.6 oz (93.7 kg)    Physical Exam HENT:     Head: Normocephalic and atraumatic.     Mouth/Throat:     Pharynx: No oropharyngeal exudate.  Eyes:     Pupils: Pupils are equal, round, and reactive to light.  Cardiovascular:     Rate and Rhythm: Normal rate and regular rhythm.  Pulmonary:     Effort: Pulmonary effort is normal.  No respiratory distress.     Breath sounds: Normal breath sounds. No wheezing.  Abdominal:     General: Bowel sounds are normal. There is no distension.     Palpations: Abdomen is soft. There is no mass.     Tenderness: There is no abdominal tenderness. There is no guarding or rebound.  Musculoskeletal:        General: No tenderness. Normal range of motion.     Cervical back: Normal range of motion and neck supple.  Skin:    General: Skin is warm.  Neurological:     Mental Status: She is alert and oriented to person, place, and time.  Psychiatric:        Mood and Affect: Affect normal.      LABORATORY DATA:  I have reviewed the data as listed Lab Results  Component Value Date   WBC 6.3 12/12/2020   HGB 13.7 12/12/2020   HCT 40.2 12/12/2020   MCV 89.0 12/12/2020   PLT 307.0 12/12/2020   Recent Labs    03/29/20 0759 12/12/20 0806  NA 139 141  K 3.5 3.5  CL 101 103  CO2 29 28  GLUCOSE 101* 101*  BUN 20 18  CREATININE 0.82 0.81  CALCIUM 9.6 9.5  PROT 7.1 7.0  ALBUMIN 4.4 4.3  AST 30 19  ALT 29 16  ALKPHOS 72 71  BILITOT 1.6* 1.4*  BILIDIR 0.3 0.2    RADIOGRAPHIC STUDIES: I have personally reviewed the radiological images as listed and agreed with the findings in the report. MM 3D SCREEN BREAST BILATERAL  Result Date: 03/06/2021 CLINICAL DATA:  Screening. EXAM: DIGITAL SCREENING BILATERAL MAMMOGRAM WITH TOMOSYNTHESIS AND CAD TECHNIQUE: Bilateral screening digital  craniocaudal and mediolateral oblique mammograms were obtained. Bilateral screening digital breast tomosynthesis was performed. The images were evaluated with computer-aided detection. COMPARISON:  Previous exam(s). ACR Breast Density Category b: There are scattered areas of fibroglandular density. FINDINGS: There are no findings suspicious for malignancy. IMPRESSION: No mammographic evidence of malignancy. A result letter of this screening mammogram will be mailed directly to the patient. RECOMMENDATION: Screening mammogram in one year. (Code:SM-B-01Y) BI-RADS CATEGORY  1: Negative. Electronically Signed   By: Nolon Nations M.D.   On: 03/06/2021 12:45   ASSESSMENT & PLAN:   Atypical ductal hyperplasia of left breast #Left breast atypical ductal hyperplasia-currently on antihormone therapy with armidex 1mg /day. DEC 2022- Mammo-WNL.   Tolerating well without any major side effects. No mammographic evidence of malignancy.  Refilled anastrozole [mail in pharmacy]  # MSK G-1 vs. Osteoarthritis-monitor for now.  # BMD march 2020- Osteopenia- T score= -1.9.  continue calcium plus vitamin D [1000+1000/day]. Will repeat BMD along with next Mammo.  # DISPOSITION: # Mammo dec 2023; BMD # follow up in 12  Months-MD: no labs;  Dr.B  All questions were answered. The patient knows to call the clinic with any problems, questions or concerns.    Cammie Sickle, MD 03/27/2021 6:37 PM

## 2021-03-27 NOTE — Assessment & Plan Note (Addendum)
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 DEC 2022- Mammo-WNL.   Tolerating well without any major side effects. No mammographic evidence of malignancy.  Refilled anastrozole [mail in pharmacy]  # MSK G-1 vs. Osteoarthritis-monitor for now.  # BMD march 2020- Osteopenia- T score= -1.9.  continue calcium plus vitamin D [1000+1000/day]. Will repeat BMD along with next Mammo.  # DISPOSITION: # Mammo dec 2023; BMD # follow up in 12  Months-MD: no labs;  Dr.B

## 2021-04-14 ENCOUNTER — Encounter: Payer: Self-pay | Admitting: Internal Medicine

## 2021-04-14 NOTE — Telephone Encounter (Signed)
Please schedule her for pre op evaluation.  Thanks  ?

## 2021-04-15 NOTE — Telephone Encounter (Signed)
Pre op scheduled

## 2021-04-16 ENCOUNTER — Other Ambulatory Visit: Payer: Self-pay

## 2021-04-16 ENCOUNTER — Ambulatory Visit: Payer: Medicare PPO | Admitting: Internal Medicine

## 2021-04-16 VITALS — BP 122/70 | HR 85 | Temp 97.9°F | Resp 16 | Ht 68.0 in | Wt 206.4 lb

## 2021-04-16 DIAGNOSIS — F419 Anxiety disorder, unspecified: Secondary | ICD-10-CM

## 2021-04-16 DIAGNOSIS — E7800 Pure hypercholesterolemia, unspecified: Secondary | ICD-10-CM

## 2021-04-16 DIAGNOSIS — K589 Irritable bowel syndrome without diarrhea: Secondary | ICD-10-CM

## 2021-04-16 DIAGNOSIS — I10 Essential (primary) hypertension: Secondary | ICD-10-CM

## 2021-04-16 DIAGNOSIS — R739 Hyperglycemia, unspecified: Secondary | ICD-10-CM

## 2021-04-16 DIAGNOSIS — Z01818 Encounter for other preprocedural examination: Secondary | ICD-10-CM | POA: Diagnosis not present

## 2021-04-16 DIAGNOSIS — Z85820 Personal history of malignant melanoma of skin: Secondary | ICD-10-CM

## 2021-04-16 LAB — LIPID PANEL
Cholesterol: 143 mg/dL (ref 0–200)
HDL: 58.8 mg/dL (ref >39.00)
LDL Cholesterol: 54 mg/dL (ref 0–99)
NonHDL: 84.12
Total CHOL/HDL Ratio: 2
Triglycerides: 150 mg/dL — ABNORMAL HIGH (ref 0.0–149.0)
VLDL: 30 mg/dL (ref 0.0–40.0)

## 2021-04-16 LAB — HEPATIC FUNCTION PANEL
ALT: 16 U/L (ref 0–35)
AST: 21 U/L (ref 0–37)
Albumin: 4.5 g/dL (ref 3.5–5.2)
Alkaline Phosphatase: 77 U/L (ref 39–117)
Bilirubin, Direct: 0.2 mg/dL (ref 0.0–0.3)
Total Bilirubin: 1.6 mg/dL — ABNORMAL HIGH (ref 0.2–1.2)
Total Protein: 7 g/dL (ref 6.0–8.3)

## 2021-04-16 LAB — BASIC METABOLIC PANEL
BUN: 15 mg/dL (ref 6–23)
CO2: 29 mEq/L (ref 19–32)
Calcium: 10 mg/dL (ref 8.4–10.5)
Chloride: 101 mEq/L (ref 96–112)
Creatinine, Ser: 0.8 mg/dL (ref 0.40–1.20)
GFR: 76.32 mL/min (ref >60.00)
Glucose, Bld: 95 mg/dL (ref 70–99)
Potassium: 4.2 mEq/L (ref 3.5–5.1)
Sodium: 141 mEq/L (ref 135–145)

## 2021-04-16 LAB — CBC WITH DIFFERENTIAL/PLATELET
Basophils Absolute: 0.1 10*3/uL (ref 0.0–0.1)
Basophils Relative: 0.9 % (ref 0.0–3.0)
Eosinophils Absolute: 0.2 10*3/uL (ref 0.0–0.7)
Eosinophils Relative: 2.1 % (ref 0.0–5.0)
HCT: 41.6 % (ref 36.0–46.0)
Hemoglobin: 14.2 g/dL (ref 12.0–15.0)
Lymphocytes Relative: 28.6 % (ref 12.0–46.0)
Lymphs Abs: 2 10*3/uL (ref 0.7–4.0)
MCHC: 34.1 g/dL (ref 30.0–36.0)
MCV: 92 fl (ref 78.0–100.0)
Monocytes Absolute: 0.4 10*3/uL (ref 0.1–1.0)
Monocytes Relative: 5.6 % (ref 3.0–12.0)
Neutro Abs: 4.5 10*3/uL (ref 1.4–7.7)
Neutrophils Relative %: 62.8 % (ref 43.0–77.0)
Platelets: 316 10*3/uL (ref 150.0–400.0)
RBC: 4.52 Mil/uL (ref 3.87–5.11)
RDW: 12.2 % (ref 11.5–15.5)
WBC: 7.1 10*3/uL (ref 4.0–10.5)

## 2021-04-16 LAB — HEMOGLOBIN A1C: Hgb A1c MFr Bld: 5.4 % (ref 4.6–6.5)

## 2021-04-16 NOTE — Progress Notes (Unsigned)
Patient ID: Jordan Benson, female   DOB: 07/13/1955, 66 y.o.   MRN: [id number]   Subjective:    Patient ID: Jordan Benson, female    DOB: 1955/06/13, 66 y.o.   MRN: [id number]  This visit occurred during the SARS-CoV-2 public health emergency.  Safety protocols were in place, including screening questions prior to the visit, additional usage of staff PPE, and extensive cleaning of exam room while observing appropriate contact time as indicated for disinfecting solutions.   Patient here for pre op evaluation.   Chief Complaint  Patient presents with   Pre-op Exam   .   HPI Here for pre op evaluation. Planning for left knee replacement 05/08/21.  She is doing well.  Traveled to [place] recently.  Had to leave after two days due to increased knee pain.  Is s/p injection.  Knee pain is better now.  No chest pain or sob reported.  No abdominal pain or bowel change reported.  Remains off alcohol. - >1 year.  Has done well.     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    BREAST BIOPSY Left 12/21/2017   [place] bx of LN, benign   BREAST LUMPECTOMY Left 01/05/2018   ADH   BREAST LUMPECTOMY Left 01/12/2018   neg   BREAST LUMPECTOMY WITH NEEDLE LOCALIZATION Left 01/05/2018   Procedure: BREAST LUMPECTOMY WITH NEEDLE LOCALIZATION;  Surgeon: Olean Ree, MD;  Location: [facility] OR[facility];  Service:  General;  Laterality: Left;   BREAST SURGERY     COLONOSCOPY  2001, 2016   x2 ARM and at [place] Right 10/2017   TKR   MOHS SURGERY  2012   OVARIAN CYST REMOVAL  1991   POLYPECTOMY     RE-EXCISION OF BREAST LUMPECTOMY Left 01/12/2018   Procedure: RE-EXCISION OF BREAST LUMPECTOMY;  Surgeon: Olean Ree, MD;  Location: [facility];  Service: General;  Laterality: Left;   SEPTOPLASTY  2009   TOTAL KNEE 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   Procedure: RIGHT TOTAL KNEE ARTHROPLASTY;  Surgeon: Sydnee Cabal, MD;  Location: [facility];  Service: Orthopedics;  Laterality: Right;  2 hrs   TUBAL LIGATION  1985   tummy tuck  1995   WISDOM TOOTH EXTRACTION     Family History  Problem Relation Age of Onset   Cancer Mother 74       breast cancer   Heart disease Mother    Hypertension Mother    Diabetes Mother    Breast cancer Mother 37   Heart disease Father    Breast cancer Sister 63       on HRT   Colon polyps Sister    Colon cancer Neg Hx    Esophageal cancer Neg Hx    Rectal cancer Neg Hx  Stomach cancer Neg Hx    Social History   Socioeconomic History   Marital status: Widowed    Spouse name: Iona Beard   Number of children: 2   Years of education: Not on file   Highest education level: Not on file  Occupational History    Employer: [employer]  Tobacco Use   Smoking status: Former    Packs/day: 0.50    Years: 10.00    Pack years: 5.00    Types: Cigarettes    Quit date: 03/12/2000    Years since quitting: 21.1   Smokeless tobacco: Never  Vaping Use   Vaping Use: Never used  Substance and Sexual Activity   Alcohol use: Yes    Alcohol/week: 14.0 standard drinks    Types: 14 Glasses of wine per week    Comment: wine daily   Drug use: Yes    Comment: gummies   Sexual activity: Not Currently  Other Topics Concern   Not on file  Social History Narrative   Not on file   Social Determinants of Health   Financial Resource Strain: Not on file  Food Insecurity: Not on  file  Transportation Needs: Not on file  Physical Activity: Not on file  Stress: Not on file  Social Connections: Not on file     Review of Systems  Constitutional:  Negative for appetite change and unexpected weight change.  HENT:  Negative for congestion and sinus pressure.   Respiratory:  Negative for cough, chest tightness and shortness of breath.   Cardiovascular:  Negative for chest pain, palpitations and leg swelling.  Gastrointestinal:  Negative for abdominal pain, diarrhea, nausea and vomiting.  Genitourinary:  Negative for difficulty urinating and dysuria.  Musculoskeletal:  Negative for myalgias.       Knee pain as outlined - better now s/p injection.   Skin:  Negative for color change and rash.  Neurological:  Negative for dizziness, light-headedness and headaches.  Psychiatric/Behavioral:  Negative for agitation and dysphoric mood.       Objective:     BP 122/70    Pulse 85    Temp 97.9 F (36.6 C)    Resp 16    Ht '5\' 8"'$  (1.727 m)    Wt 206 lb 6.4 oz (93.6 kg)    SpO2 98%    BMI 31.38 kg/m  Wt Readings from Last 3 Encounters:  04/16/21 206 lb 6.4 oz (93.6 kg)  03/27/21 206 lb 9.6 oz (93.7 kg)  01/28/21 207 lb (93.9 kg)    Physical Exam Vitals reviewed.  Constitutional:      General: She is not in acute distress.    Appearance: Normal appearance.  HENT:     Head: Normocephalic and atraumatic.     Right Ear: External ear normal.     Left Ear: External ear normal.  Eyes:     General: No scleral icterus.       Right eye: No discharge.        Left eye: No discharge.     Conjunctiva/sclera: Conjunctivae normal.  Neck:     Thyroid: No thyromegaly.  Cardiovascular:     Rate and Rhythm: Normal rate and regular rhythm.  Pulmonary:     Effort: No respiratory distress.     Breath sounds: Normal breath sounds. No wheezing.  Abdominal:     General: Bowel sounds are normal.     Palpations: Abdomen is soft.     Tenderness: There is no abdominal tenderness.   Musculoskeletal:  General: No swelling or tenderness.     Cervical back: Neck supple. No tenderness.  Lymphadenopathy:     Cervical: No cervical adenopathy.  Skin:    Findings: No erythema or rash.  Neurological:     Mental Status: She is alert.  Psychiatric:        Mood and Affect: Mood normal.        Behavior: Behavior normal.     Outpatient Encounter Medications as of 04/16/2021  Medication Sig   amLODipine (NORVASC) 10 MG tablet TAKE 1 TABLET EVERY DAY   anastrozole (ARIMIDEX) 1 MG tablet Take 1 tablet (1 mg total) by mouth daily.   CALCIUM-VITAMIN D PO Take by mouth.   enalapril (VASOTEC) 10 MG tablet TAKE 1 TABLET TWICE DAILY (Patient taking differently: Take 10 mg by mouth daily. TAKE 1 TABLET DAILY)   FLUoxetine (PROZAC) 40 MG capsule TAKE 1 CAPSULE EVERY DAY   Multiple Vitamins-Calcium (ONE-A-DAY WOMENS PO) Take 1 tablet by mouth daily.   rosuvastatin (CRESTOR) 10 MG tablet TAKE 1 TABLET EVERY DAY   triamterene-hydrochlorothiazide (MAXZIDE-25) 37.5-25 MG tablet TAKE 1 TABLET EVERY DAY   No facility-administered encounter medications on file as of 04/16/2021.     Lab Results  Component Value Date   WBC 6.3 12/12/2020   HGB 13.7 12/12/2020   HCT 40.2 12/12/2020   PLT 307.0 12/12/2020   GLUCOSE 101 (H) 12/12/2020   CHOL 137 12/12/2020   TRIG 129.0 12/12/2020   HDL 50.90 12/12/2020   LDLDIRECT 138.0 03/20/2019   LDLCALC 60 12/12/2020   ALT 16 12/12/2020   AST 19 12/12/2020   NA 141 12/12/2020   K 3.5 12/12/2020   CL 103 12/12/2020   CREATININE 0.81 12/12/2020   BUN 18 12/12/2020   CO2 28 12/12/2020   TSH 2.25 12/12/2020   INR 0.94 08/13/2017   HGBA1C 5.4 12/12/2020    MM 3D SCREEN BREAST BILATERAL  Result Date: 03/06/2021 CLINICAL DATA:  Screening. EXAM: DIGITAL SCREENING BILATERAL MAMMOGRAM WITH TOMOSYNTHESIS AND CAD TECHNIQUE: Bilateral screening digital craniocaudal and mediolateral oblique mammograms were obtained. Bilateral screening digital  breast tomosynthesis was performed. The images were evaluated with computer-aided detection. COMPARISON:  Previous exam(s). ACR Breast Density Category b: There are scattered areas of fibroglandular density. FINDINGS: There are no findings suspicious for malignancy. IMPRESSION: No mammographic evidence of malignancy. A result letter of this screening mammogram will be mailed directly to the patient. RECOMMENDATION: Screening mammogram in one year. (Code:SM-B-01Y) BI-RADS CATEGORY  1: Negative. Electronically Signed   By: Nolon Nations M.D.   On: 03/06/2021 12:45      Assessment & Plan:   Problem List Items Addressed This Visit     Pre-op evaluation - Primary   Relevant Orders   EKG 12-Lead (Completed)     Einar Pheasant, MD

## 2021-04-22 DIAGNOSIS — M25562 Pain in left knee: Secondary | ICD-10-CM | POA: Diagnosis not present

## 2021-04-27 ENCOUNTER — Encounter: Payer: Self-pay | Admitting: Internal Medicine

## 2021-04-27 NOTE — Assessment & Plan Note (Signed)
On crestor.  Low cholesterol diet and exercise.  Follow lipid panel and liver function tests.   Lab Results  Component Value Date   CHOL 143 04/16/2021   HDL 58.80 04/16/2021   LDLCALC 54 04/16/2021   LDLDIRECT 138.0 03/20/2019   TRIG 150.0 (H) 04/16/2021   CHOLHDL 2 04/16/2021   

## 2021-04-27 NOTE — Assessment & Plan Note (Signed)
Blood pressure doing well on enalapril, triam/hctz and amlodipine.  Only taking enalapril q day. Continue current medication regimen.  Follow pressures.  Follow metabolic panel.  ?

## 2021-04-27 NOTE — Assessment & Plan Note (Signed)
Recheck liver panel.  

## 2021-04-27 NOTE — Assessment & Plan Note (Signed)
Low carb diet and exercise.  Follow met b and a1c.   ?Lab Results  ?Component Value Date  ? HGBA1C 5.4 04/16/2021  ? ?

## 2021-04-27 NOTE — Assessment & Plan Note (Signed)
Followed by dermatology

## 2021-04-27 NOTE — Assessment & Plan Note (Signed)
Did well with previous surgeries.  EKG today - SR with no acute ischemic changes.  She is without symptoms.  No chest pain or sob reported.  No cough or congestion reported.  Given EKG and no active symptoms, I feel she is at low risk from a cardiac standpoint to proceed with planned surgery.  She will need close intra op and post op monitoring of heart rate and blood pressure to avoid extremes.  ?

## 2021-04-27 NOTE — Assessment & Plan Note (Signed)
Bowels stable.  

## 2021-04-27 NOTE — Assessment & Plan Note (Signed)
Continues on prozac.  Doing well.   ?

## 2021-04-28 DIAGNOSIS — M25562 Pain in left knee: Secondary | ICD-10-CM | POA: Diagnosis not present

## 2021-05-06 ENCOUNTER — Encounter: Payer: Self-pay | Admitting: Nurse Practitioner

## 2021-05-06 ENCOUNTER — Other Ambulatory Visit: Payer: Self-pay

## 2021-05-06 ENCOUNTER — Ambulatory Visit: Payer: Medicare PPO | Admitting: Nurse Practitioner

## 2021-05-06 VITALS — BP 102/66 | HR 86 | Temp 97.1°F | Resp 18

## 2021-05-06 DIAGNOSIS — L244 Irritant contact dermatitis due to drugs in contact with skin: Secondary | ICD-10-CM

## 2021-05-06 MED ORDER — MUPIROCIN CALCIUM 2 % EX CREA: 1.0000 "application " | TOPICAL_CREAM | Freq: Two times a day (BID) | CUTANEOUS | 0 refills | Status: AC

## 2021-05-06 NOTE — Progress Notes (Signed)
? ?  Subjective:  ? ? Patient ID: Jordan Benson, female    DOB: 09/30/1955, 66 y.o.   MRN: [id number] ? ?HPI ? ?66 y/o female presents to the clinic with complaints of cat scratches to her left wrist. She states that she thinks she has MRSA and her hand is going to need to be amputated. She has been using polysporin ointment 2-3 times per hour, washing the scratches with hot water and scrubbing with a washcloth several times daily, and scratching. Cats are indoor and fully vaccinated, approximately 68 year old. Denies fever, chills, body aches. She is scheduled for a knee replacement on Thursday and is afraid these scratches will prevent her from having her surgery.  ? ?Review of Systems  ?Constitutional: Negative.   ?HENT: Negative.    ?Eyes: Negative.   ?Respiratory: Negative.    ?Cardiovascular: Negative.   ?Gastrointestinal: Negative.   ?Endocrine: Negative.   ?Genitourinary: Negative.   ?Musculoskeletal: Negative.   ?Skin:  Positive for rash.  ?Allergic/Immunologic: Negative.   ?Neurological: Negative.   ?Hematological: Negative.   ?Psychiatric/Behavioral: Negative.    ? ?   ?Objective:  ? Physical Exam ?Vitals reviewed.  ?Constitutional:   ?   General: She is not in acute distress. ?   Appearance: Normal appearance. She is normal weight. She is not ill-appearing.  ?HENT:  ?   Head: Normocephalic and atraumatic.  ?Skin: ?   General: Skin is warm and dry.  ?   Capillary Refill: Capillary refill takes less than 2 seconds.  ?   Findings: Rash present. Rash is papular and urticarial. Rash is not crusting, macular, nodular, purpuric, pustular, scaling or vesicular.  ? ?    ?   Comments: Multiple, small, red, nonraised papules to left wrist. Erythema is present. Scratching marks noted around area from patient scratching. Area appears to be inflamed from chemical and physical irritation.  ?Neurological:  ?   General: No focal deficit present.  ?   Mental Status: She is alert and oriented to person, place, and time.   ?Psychiatric:     ?   Mood and Affect: Mood normal.     ?   Behavior: Behavior normal.     ?   Thought Content: Thought content normal.     ?   Judgment: Judgment normal.  ? ? ?Vitals:  ? 05/06/21 1021  ?BP: 102/66  ?Pulse: 86  ?Resp: 18  ?Temp: (!) 97.1 ?F (36.2 ?C)  ?SpO2: 98%  ?  ? ? ?   ?Assessment & Plan:  ?1. Irritant contact dermatitis due to drug in contact with skin ?Apply Bactroban cream no more than twice daily to affected area. May cleanse with warm water as needed. May also use an emollient such as Eucerin cream or vaseline to the area to increase moisture and cover with a telfa to prevent scratching.  ? ?- mupirocin cream (BACTROBAN) 2 %; Apply 1 application. topically 2 (two) times daily for 8 days. Apply to affected area 1-2 times daily.  Dispense: 15 g; Refill: 0 ?   ? ?

## 2021-05-08 DIAGNOSIS — I10 Essential (primary) hypertension: Secondary | ICD-10-CM | POA: Diagnosis not present

## 2021-05-08 DIAGNOSIS — F32A Depression, unspecified: Secondary | ICD-10-CM | POA: Diagnosis not present

## 2021-05-08 DIAGNOSIS — Z79811 Long term (current) use of aromatase inhibitors: Secondary | ICD-10-CM | POA: Diagnosis not present

## 2021-05-08 DIAGNOSIS — Z96651 Presence of right artificial knee joint: Secondary | ICD-10-CM | POA: Diagnosis not present

## 2021-05-08 DIAGNOSIS — M1712 Unilateral primary osteoarthritis, left knee: Secondary | ICD-10-CM | POA: Diagnosis not present

## 2021-05-08 DIAGNOSIS — E785 Hyperlipidemia, unspecified: Secondary | ICD-10-CM | POA: Diagnosis not present

## 2021-05-08 DIAGNOSIS — G8918 Other acute postprocedural pain: Secondary | ICD-10-CM | POA: Diagnosis not present

## 2021-05-08 DIAGNOSIS — Z4789 Encounter for other orthopedic aftercare: Secondary | ICD-10-CM | POA: Diagnosis not present

## 2021-05-08 DIAGNOSIS — Z96652 Presence of left artificial knee joint: Secondary | ICD-10-CM | POA: Diagnosis not present

## 2021-05-08 DIAGNOSIS — M25562 Pain in left knee: Secondary | ICD-10-CM | POA: Diagnosis not present

## 2021-05-08 DIAGNOSIS — Z853 Personal history of malignant neoplasm of breast: Secondary | ICD-10-CM | POA: Diagnosis not present

## 2021-05-09 DIAGNOSIS — Z79811 Long term (current) use of aromatase inhibitors: Secondary | ICD-10-CM | POA: Diagnosis not present

## 2021-05-09 DIAGNOSIS — Z853 Personal history of malignant neoplasm of breast: Secondary | ICD-10-CM | POA: Diagnosis not present

## 2021-05-09 DIAGNOSIS — Z96651 Presence of right artificial knee joint: Secondary | ICD-10-CM | POA: Diagnosis not present

## 2021-05-09 DIAGNOSIS — M1712 Unilateral primary osteoarthritis, left knee: Secondary | ICD-10-CM | POA: Diagnosis not present

## 2021-05-14 DIAGNOSIS — Z96652 Presence of left artificial knee joint: Secondary | ICD-10-CM | POA: Diagnosis not present

## 2021-05-14 DIAGNOSIS — M25562 Pain in left knee: Secondary | ICD-10-CM | POA: Diagnosis not present

## 2021-05-15 DIAGNOSIS — Z96652 Presence of left artificial knee joint: Secondary | ICD-10-CM | POA: Diagnosis not present

## 2021-05-15 DIAGNOSIS — M25562 Pain in left knee: Secondary | ICD-10-CM | POA: Diagnosis not present

## 2021-05-20 DIAGNOSIS — Z96652 Presence of left artificial knee joint: Secondary | ICD-10-CM | POA: Diagnosis not present

## 2021-05-20 DIAGNOSIS — M25562 Pain in left knee: Secondary | ICD-10-CM | POA: Diagnosis not present

## 2021-05-23 DIAGNOSIS — M25562 Pain in left knee: Secondary | ICD-10-CM | POA: Diagnosis not present

## 2021-05-23 DIAGNOSIS — Z96652 Presence of left artificial knee joint: Secondary | ICD-10-CM | POA: Diagnosis not present

## 2021-05-26 ENCOUNTER — Telehealth: Payer: Self-pay | Admitting: Internal Medicine

## 2021-05-26 NOTE — Telephone Encounter (Signed)
Copied from [place] [phone]. Topic: Medicare AWV ?>> May 26, 2021 11:18 AM Harris-Coley, Hannah Beat wrote: ?Reason for CRM: Left message for patient to schedule Annual Wellness Visit.  Please schedule with Nurse Health Advisor Denisa O'Brien-Blaney, LPN at [facility].  Please call [phone] ask for Juliann Pulse ?

## 2021-05-28 DIAGNOSIS — Z96652 Presence of left artificial knee joint: Secondary | ICD-10-CM | POA: Diagnosis not present

## 2021-05-28 DIAGNOSIS — M25562 Pain in left knee: Secondary | ICD-10-CM | POA: Diagnosis not present

## 2021-05-30 DIAGNOSIS — M25562 Pain in left knee: Secondary | ICD-10-CM | POA: Diagnosis not present

## 2021-05-30 DIAGNOSIS — Z96652 Presence of left artificial knee joint: Secondary | ICD-10-CM | POA: Diagnosis not present

## 2021-06-03 DIAGNOSIS — Z96652 Presence of left artificial knee joint: Secondary | ICD-10-CM | POA: Diagnosis not present

## 2021-06-03 DIAGNOSIS — M25562 Pain in left knee: Secondary | ICD-10-CM | POA: Diagnosis not present

## 2021-06-05 DIAGNOSIS — Z96652 Presence of left artificial knee joint: Secondary | ICD-10-CM | POA: Diagnosis not present

## 2021-06-05 DIAGNOSIS — M25562 Pain in left knee: Secondary | ICD-10-CM | POA: Diagnosis not present

## 2021-06-10 DIAGNOSIS — M25562 Pain in left knee: Secondary | ICD-10-CM | POA: Diagnosis not present

## 2021-06-10 DIAGNOSIS — Z96652 Presence of left artificial knee joint: Secondary | ICD-10-CM | POA: Diagnosis not present

## 2021-06-12 ENCOUNTER — Other Ambulatory Visit: Payer: Self-pay | Admitting: Internal Medicine

## 2021-06-13 DIAGNOSIS — Z96652 Presence of left artificial knee joint: Secondary | ICD-10-CM | POA: Diagnosis not present

## 2021-06-13 DIAGNOSIS — M25562 Pain in left knee: Secondary | ICD-10-CM | POA: Diagnosis not present

## 2021-06-17 DIAGNOSIS — M25562 Pain in left knee: Secondary | ICD-10-CM | POA: Diagnosis not present

## 2021-06-17 DIAGNOSIS — Z96652 Presence of left artificial knee joint: Secondary | ICD-10-CM | POA: Diagnosis not present

## 2021-06-19 DIAGNOSIS — Z96652 Presence of left artificial knee joint: Secondary | ICD-10-CM | POA: Diagnosis not present

## 2021-06-19 DIAGNOSIS — M25562 Pain in left knee: Secondary | ICD-10-CM | POA: Diagnosis not present

## 2021-06-25 DIAGNOSIS — M25562 Pain in left knee: Secondary | ICD-10-CM | POA: Diagnosis not present

## 2021-06-25 DIAGNOSIS — Z96652 Presence of left artificial knee joint: Secondary | ICD-10-CM | POA: Diagnosis not present

## 2021-07-08 ENCOUNTER — Telehealth: Payer: Self-pay | Admitting: Internal Medicine

## 2021-07-08 NOTE — Telephone Encounter (Signed)
Spoke with patient she req CB 07/21/21

## 2021-07-18 ENCOUNTER — Encounter: Payer: Self-pay | Admitting: Internal Medicine

## 2021-07-22 ENCOUNTER — Encounter: Payer: Self-pay | Admitting: Internal Medicine

## 2021-07-22 DIAGNOSIS — F439 Reaction to severe stress, unspecified: Secondary | ICD-10-CM

## 2021-07-23 ENCOUNTER — Other Ambulatory Visit: Payer: Self-pay | Admitting: Internal Medicine

## 2021-07-23 DIAGNOSIS — F439 Reaction to severe stress, unspecified: Secondary | ICD-10-CM

## 2021-07-23 NOTE — Telephone Encounter (Signed)
I have placed an order for psych referral to Dr Nicolasa Ducking. Let me know if she does not accept this insurance and I will place another order.  Thanks

## 2021-07-23 NOTE — Progress Notes (Signed)
Order placed for referral to psychiatry.

## 2021-07-29 ENCOUNTER — Encounter: Payer: Medicare PPO | Admitting: Internal Medicine

## 2021-08-05 ENCOUNTER — Ambulatory Visit (INDEPENDENT_AMBULATORY_CARE_PROVIDER_SITE_OTHER): Payer: Medicare PPO

## 2021-08-05 VITALS — Ht 68.0 in | Wt 200.1 lb

## 2021-08-05 DIAGNOSIS — Z0000 Encounter for general adult medical examination without abnormal findings: Secondary | ICD-10-CM

## 2021-08-05 NOTE — Patient Instructions (Addendum)
  Jordan Benson , Thank you for taking time to come for your Medicare Wellness Visit. I appreciate your ongoing commitment to your health goals. Please review the following plan we discussed and let me know if I can assist you in the future.   These are the goals we discussed:  Goals      Follow up with Primary Care Provider     As needed.     Weight (lb) < 190 lb (86.2 kg)     Weight goal 185lb        This is a list of the screening recommended for you and due dates:  Health Maintenance  Topic Date Due   COVID-19 Vaccine (4 - Booster for Moderna series) 08/21/2021*   Pneumonia Vaccine (2 - PPSV23 if available, else PCV20) 01/28/2022*   Flu Shot  09/09/2021   Colon Cancer Screening  12/04/2022   Mammogram  03/07/2023   Tetanus Vaccine  12/31/2025   DEXA scan (bone density measurement)  Completed   Zoster (Shingles) Vaccine  Completed   HPV Vaccine  Aged Out   Hepatitis C Screening: USPSTF Recommendation to screen - Ages 75-79 yo.  Discontinued  *Topic was postponed. The date shown is not the original due date.

## 2021-08-05 NOTE — Progress Notes (Signed)
Subjective:   Jordan Benson is a 66 y.o. female who presents for Medicare Annual (Subsequent) preventive examination.  Review of Systems    No ROS.  Medicare Wellness Virtual Visit.  Visual/audio telehealth visit, UTA vital signs.   See social history for additional risk factors.   Cardiac Risk Factors include: advanced age (>7men, >53 women)     Objective:    Today's Vitals   08/05/21 0915  Weight: 200 lb 1.6 oz (90.8 kg)  Height: 5\' 8"  (1.727 m)   Body mass index is 30.43 kg/m.     08/05/2021    9:11 AM 03/27/2021   10:37 AM 03/25/2020   12:45 PM 02/26/2020    8:36 AM 07/12/2018   11:29 AM 03/10/2018    2:25 PM 01/12/2018    1:32 PM  Advanced Directives  Does Patient Have a Medical Advance Directive? Yes Yes Yes Yes Yes Yes Yes  Type of Estate agent of [place];Living will Healthcare Power of [place];Living will Healthcare Power of [place];Living will Healthcare Power of [place];Living will Living will;Healthcare Power of [employer] Power of [place];Living will Healthcare Power of [place];Living will  Does patient want to make changes to medical advance directive? No - Patient declined  No - Patient declined No - Patient declined No - Patient declined No - Patient declined No - Patient declined  Copy of Healthcare Power of Attorney in Chart? No - copy requested  No - copy requested No - copy requested No - copy requested No - copy requested No - copy requested    Current Medications (verified) Outpatient Encounter Medications as of 08/05/2021  Medication Sig   amLODipine (NORVASC) 10 MG tablet TAKE 1 TABLET EVERY DAY   anastrozole (ARIMIDEX) 1 MG tablet Take 1 tablet (1 mg total) by mouth daily.   CALCIUM-VITAMIN D PO Take by mouth.   enalapril (VASOTEC) 10 MG tablet TAKE 1 TABLET TWICE DAILY (Patient taking differently: Take 10 mg by mouth daily. TAKE 1 TABLET DAILY)   FLUoxetine (PROZAC) 40 MG capsule TAKE 1 CAPSULE EVERY DAY    Multiple Vitamins-Calcium (ONE-A-DAY WOMENS PO) Take 1 tablet by mouth daily.   rosuvastatin (CRESTOR) 10 MG tablet TAKE 1 TABLET EVERY DAY   triamterene-hydrochlorothiazide (MAXZIDE-25) 37.5-25 MG tablet TAKE 1 TABLET EVERY DAY   No facility-administered encounter medications on file as of 08/05/2021.    Allergies (verified) Patient has no known allergies.   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    BREAST BIOPSY Left 12/21/2017   [place] bx of LN, benign   BREAST LUMPECTOMY Left 01/05/2018   ADH   BREAST LUMPECTOMY Left 01/12/2018   neg   BREAST LUMPECTOMY WITH NEEDLE LOCALIZATION Left 01/05/2018   Procedure: BREAST LUMPECTOMY WITH NEEDLE LOCALIZATION;  Surgeon: Henrene Dodge, MD;  Location: [facility] ORS;  Service: General;  Laterality: Left;   BREAST SURGERY     COLONOSCOPY  2001, 2016   x2 ARM and at [facility]   JOINT REPLACEMENT Right 10/2017   TKR   MOHS SURGERY  2012   OVARIAN CYST REMOVAL  1991   POLYPECTOMY     RE-EXCISION OF BREAST LUMPECTOMY Left 01/12/2018   Procedure: RE-EXCISION OF BREAST LUMPECTOMY;  Surgeon: Henrene Dodge, MD;  Location: [facility] ORS;  Service: General;  Laterality: Left;   SEPTOPLASTY  2009   TOTAL KNEE 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   Procedure: RIGHT TOTAL KNEE ARTHROPLASTY;  Surgeon: Eugenia Mcalpine, MD;  Location: [facility];  Service: Orthopedics;   Laterality: Right;  2 hrs   TUBAL LIGATION  1985   tummy tuck  1995   WISDOM TOOTH EXTRACTION     Family History  Problem Relation Age of Onset   Cancer Mother 42       breast cancer   Heart disease Mother    Hypertension Mother    Diabetes Mother    Breast cancer Mother 75   Heart disease Father    Breast cancer Sister 53       on HRT   Colon polyps Sister    Colon cancer Neg Hx    Esophageal cancer Neg Hx    Rectal cancer Neg Hx    Stomach cancer Neg Hx    Social History   Socioeconomic History   Marital status: Widowed    Spouse name: Greggory Stallion   Number of children: 2   Years of education: Not on file   Highest education level: Not on file  Occupational History    Employer: [employer]  Tobacco Use   Smoking status: Former    Packs/day: 0.50    Years: 10.00    Total pack years: 5.00    Types: Cigarettes    Quit date: 03/12/2000    Years since quitting: 21.4   Smokeless tobacco: Never  Vaping Use   Vaping Use: Never used  Substance and Sexual Activity   Alcohol use: Yes    Alcohol/week: 14.0 standard drinks of alcohol    Types: 14 Glasses of wine per week    Comment: wine daily   Drug use: Yes    Comment: gummies   Sexual activity: Not Currently  Other Topics Concern   Not on file  Social History Narrative   Not on file   Social Determinants of Health   Financial Resource Strain: Low Risk  (08/05/2021)   Overall Financial Resource Strain (CARDIA)    Difficulty of Paying Living Expenses: Not hard at all  Food Insecurity: No Food Insecurity (08/05/2021)   Hunger Vital Sign    Worried About Running Out of Food in the Last Year: Never true    Ran Out of Food in the Last Year: Never true  Transportation Needs: No Transportation Needs (08/05/2021)   PRAPARE - Administrator, Civil Service (Medical): No    Lack of Transportation (Non-Medical): No  Physical Activity: Sufficiently Active (08/05/2021)   Exercise Vital Sign    Days of Exercise per Week: 3 days     Minutes of Exercise per Session: 60 min  Stress: No Stress Concern Present (08/05/2021)   [employer] of Occupational Health - Occupational Stress Questionnaire    Feeling of Stress : Not at all  Social Connections: Unknown (08/05/2021)   Social Connection and Isolation Panel [NHANES]    Frequency of Communication with Friends and  Family: More than three times a week    Frequency of Social Gatherings with Friends and Family: More than three times a week    Attends Religious Services: Not on file    Active Member of Clubs or Organizations: Not on file    Attends Banker Meetings: Not on file    Marital Status: Widowed    Tobacco Counseling Counseling given: Not Answered   Clinical Intake:  Pre-visit preparation completed: Yes        Diabetes: No  How often do you need to have someone help you when you read instructions, pamphlets, or other written materials from your doctor or pharmacy?: 1 - Never  Interpreter Needed?: No    Activities of Daily Living    08/05/2021    9:12 AM  In your present state of health, do you have any difficulty performing the following activities:  Hearing? 0  Vision? 0  Difficulty concentrating or making decisions? 0  Walking or climbing stairs? 0  Dressing or bathing? 0  Doing errands, shopping? 0  Preparing Food and eating ? N  Using the Toilet? N  In the past six months, have you accidently leaked urine? Y  Comment Managed with daily pad  Do you have problems with loss of bowel control? N  Managing your Medications? N  Managing your Finances? N  Housekeeping or managing your Housekeeping? N   Patient Care Team: Dale [place], MD as PCP - General (Internal Medicine)  Indicate any recent Medical Services you may have received from other than [facility] providers in the past year (date may be approximate).     Assessment:   This is a routine wellness examination for Quanasia.  Virtual Visit via Telephone Note  I  connected with  Lynford Citizen on 08/05/21 at  9:00 AM EDT by telephone and verified that I am speaking with the correct person using two identifiers.  Persons participating in the virtual visit: patient/Nurse Health Advisor   I discussed the limitations of performing an evaluation and management service by telehealth. We continued and completed visit with audio only. Some vital signs may be absent or patient reported.   Hearing/Vision screen Hearing Screening - Comments:: Patient is able to hear conversational tones without difficulty. No issues reported. Vision Screening - Comments:: Followed by [name] Bilateral cataracts extracted Wears corrective lenses They have seen their ophthalmologist in the last 12 months.   Dietary issues and exercise activities discussed: Current Exercise Habits: Home exercise routine, Type of exercise: walking;calisthenics (Water aerobics), Time (Minutes): 60, Frequency (Times/Week): 3, Weekly Exercise (Minutes/Week): 180, Intensity: Mild   Goals Addressed             This Visit's Progress    Follow up with Primary Care Provider       As needed.     Weight (lb) < 190 lb (86.2 kg)   200 lb 1.6 oz (90.8 kg)    Weight goal 185lb       Depression Screen    08/05/2021    9:18 AM 12/12/2020    8:18 AM 05/10/2020    4:33 PM 03/25/2020   12:41 PM 12/01/2019    2:06 PM 03/06/2019    9:07 AM 06/17/2018    9:35 AM  PHQ 2/9 Scores  PHQ - 2 Score 0 0 4 0 0 0 0  PHQ- 9 Score   10        Fall Risk    08/05/2021    9:19  AM 12/12/2020    8:18 AM 05/10/2020    4:33 PM 03/25/2020   12:46 PM 12/01/2019    2:06 PM  Fall Risk   Falls in the past year? 0 0 0 0 0  Number falls in past yr: 0 0 0 0 0  Injury with Fall?  0 0 0 0  Risk for fall due to :  No Fall Risks     Follow up Falls evaluation completed  Falls evaluation completed Falls evaluation completed Falls evaluation completed    FALL RISK PREVENTION PERTAINING TO THE HOME: Home free of loose  throw rugs in walkways, pet beds, electrical cords, etc? Yes  Adequate lighting in your home to reduce risk of falls? Yes   ASSISTIVE DEVICES UTILIZED TO PREVENT FALLS: Use of a cane, walker or w/c? No   TIMED UP AND GO: Was the test performed? No .   Cognitive Function:  Patient is alert and oriented x3.       Immunizations Immunization History  Administered Date(s) Administered   Influenza,inj,Quad PF,6+ Mos 10/17/2014, 10/06/2016, 11/05/2018, 11/13/2020   Influenza-Unspecified 11/09/2013, 11/30/2017, 10/27/2019   Moderna Sars-Covid-2 Vaccination 03/11/2019, 04/08/2019, 12/06/2019   Pneumococcal Conjugate-13 08/01/2019   Tdap 01/01/2016   Zoster Recombinat (Shingrix) 08/05/2020, 10/05/2020   Screening Tests Health Maintenance  Topic Date Due   COVID-19 Vaccine (4 - Booster for Moderna series) 08/21/2021 (Originally 01/31/2020)   Pneumonia Vaccine 73+ Years old (2 - PPSV23 if available, else PCV20) 01/28/2022 (Originally 07/31/2020)   INFLUENZA VACCINE  09/09/2021   COLONOSCOPY (Pts 45-55yrs Insurance coverage will need to be confirmed)  12/04/2022   MAMMOGRAM  03/07/2023   TETANUS/TDAP  12/31/2025   DEXA SCAN  Completed   Zoster Vaccines- Shingrix  Completed   HPV VACCINES  Aged Out   Hepatitis C Screening  Discontinued   Health Maintenance There are no preventive care reminders to display for this patient.  Lung Cancer Screening: (Low Dose CT Chest recommended if Age 24-80 years, 30 pack-year currently smoking OR have quit w/in 15years.) does not qualify.   Vision Screening: Recommended annual ophthalmology exams for early detection of glaucoma and other disorders of the eye.  Dental Screening: Recommended annual dental exams for proper oral hygiene  Community Resource Referral / Chronic Care Management: CRR required this visit?  No   CCM required this visit?  No      Plan:   Keep all routine maintenance appointments.   I have personally reviewed and noted  the following in the patient's chart:   Medical and social history Use of alcohol, tobacco or illicit drugs  Current medications and supplements including opioid prescriptions.  Functional ability and status Nutritional status Physical activity Advanced directives List of other physicians Hospitalizations, surgeries, and ER visits in previous 12 months Vitals Screenings to include cognitive, depression, and falls Referrals and appointments  In addition, I have reviewed and discussed with patient certain preventive protocols, quality metrics, and best practice recommendations. A written personalized care plan for preventive services as well as general preventive health recommendations were provided to patient.     Ashok Pall, LPN   1/61/0960

## 2021-08-24 ENCOUNTER — Other Ambulatory Visit: Payer: Self-pay | Admitting: Internal Medicine

## 2021-08-26 ENCOUNTER — Encounter: Payer: Medicare PPO | Admitting: Internal Medicine

## 2021-09-04 DIAGNOSIS — Z23 Encounter for immunization: Secondary | ICD-10-CM | POA: Diagnosis not present

## 2021-09-04 DIAGNOSIS — Z209 Contact with and (suspected) exposure to unspecified communicable disease: Secondary | ICD-10-CM | POA: Diagnosis not present

## 2021-09-04 DIAGNOSIS — Z203 Contact with and (suspected) exposure to rabies: Secondary | ICD-10-CM | POA: Diagnosis not present

## 2021-09-07 DIAGNOSIS — Z23 Encounter for immunization: Secondary | ICD-10-CM | POA: Diagnosis not present

## 2021-09-07 DIAGNOSIS — Z203 Contact with and (suspected) exposure to rabies: Secondary | ICD-10-CM | POA: Diagnosis not present

## 2021-09-08 ENCOUNTER — Telehealth: Payer: Self-pay

## 2021-09-08 NOTE — Telephone Encounter (Incomplete)
Called asking where she could go

## 2021-09-08 NOTE — Telephone Encounter (Signed)
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  Patient states she started her series of rabies shots before getting back in town, and now she needs to know where to go to get the third shot in the series.

## 2021-09-08 NOTE — Telephone Encounter (Signed)
Pt advised to contact health department for rabies shots.

## 2021-09-11 ENCOUNTER — Ambulatory Visit
Admission: EM | Admit: 2021-09-11 | Discharge: 2021-09-11 | Disposition: A | Discharge status: Home / Self Care | Payer: Medicare PPO | Attending: Emergency Medicine | Admitting: Emergency Medicine

## 2021-09-11 ENCOUNTER — Ambulatory Visit (INDEPENDENT_AMBULATORY_CARE_PROVIDER_SITE_OTHER): Payer: Medicare PPO | Admitting: Psychiatry

## 2021-09-11 ENCOUNTER — Encounter: Payer: Self-pay | Admitting: Internal Medicine

## 2021-09-11 ENCOUNTER — Encounter: Payer: Self-pay | Admitting: Psychiatry

## 2021-09-11 VITALS — BP 135/77 | HR 82 | Temp 98.3°F | Ht 68.0 in | Wt 205.4 lb

## 2021-09-11 DIAGNOSIS — Z203 Contact with and (suspected) exposure to rabies: Secondary | ICD-10-CM

## 2021-09-11 DIAGNOSIS — F1021 Alcohol dependence, in remission: Secondary | ICD-10-CM

## 2021-09-11 DIAGNOSIS — F411 Generalized anxiety disorder: Secondary | ICD-10-CM | POA: Diagnosis not present

## 2021-09-11 DIAGNOSIS — F339 Major depressive disorder, recurrent, unspecified: Secondary | ICD-10-CM

## 2021-09-11 DIAGNOSIS — F330 Major depressive disorder, recurrent, mild: Secondary | ICD-10-CM | POA: Insufficient documentation

## 2021-09-11 MED ORDER — RABIES VACCINE, PCEC IM SUSR: 1.0000 mL | Freq: Once | INTRAMUSCULAR | Status: DC

## 2021-09-11 MED ORDER — BUPROPION HCL 75 MG PO TABS: 75.0000 mg | ORAL_TABLET | Freq: Every morning | ORAL | 0 refills | Status: DC

## 2021-09-11 MED ORDER — RABIES VACCINE, PCEC IM SUSR
1.0000 mL | Freq: Once | INTRAMUSCULAR | Status: AC
  Administered 2021-09-11: 1 mL via INTRAMUSCULAR
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
## 2021-09-11 NOTE — Patient Instructions (Signed)
[email or web address]  [email or web address]  [place] [phone]  [place] [email or web address] [place], [place] [address]  [phone]      Bupropion Tablets (Depression/Mood Disorders) What is this medication? BUPROPION (byoo PROE pee on) treats depression. It increases norepinephrine and dopamine in the brain, hormones that help regulate mood. It belongs to a group of medications called NDRIs. This medicine may be used for other purposes; ask your health care provider or pharmacist if you have questions. COMMON BRAND NAME(S): Wellbutrin What should I tell my care team before I take this medication? They need to know if you have any of these conditions: An eating disorder, such as anorexia or bulimia Bipolar disorder or psychosis Diabetes or high blood sugar, treated with medication Glaucoma Heart disease, previous heart attack, or irregular heart beat Head injury or brain tumor High blood pressure Kidney or liver disease Seizures Suicidal thoughts or a previous suicide attempt Tourette's syndrome Weight loss An unusual or allergic reaction to bupropion, other medications, foods, dyes, or preservatives Pregnant or trying to become pregnant Breast-feeding How should I use this medication? Take this medication by mouth with a glass of water. Follow the directions on the prescription label. You can take it with or without food. If it upsets your stomach, take it with food. Take your medication at regular intervals. Do not take your medication more often than directed. Do not stop taking this medication suddenly except upon the advice of your care team. Stopping this medication too quickly may cause serious side effects or your condition may worsen. A special MedGuide will be given to you by the pharmacist with each prescription and refill. Be sure to read this information carefully each time. Talk to your care team regarding the use of this  medication in children. Special care may be needed. Overdosage: If you think you have taken too much of this medicine contact a poison control center or emergency room at once. NOTE: This medicine is only for you. Do not share this medicine with others. What if I miss a dose? If you miss a dose, take it as soon as you can. If it is less than four hours to your next dose, take only that dose and skip the missed dose. Do not take double or extra doses. What may interact with this medication? Do not take this medication with any of the following: Linezolid MAOIs like Azilect, Carbex, Eldepryl, Marplan, Nardil, and Parnate Methylene blue (injected into a vein) Other medications that contain bupropion like Zyban This medication may also interact with the following: Alcohol Certain medications for anxiety or sleep Certain medications for blood pressure like metoprolol, propranolol Certain medications for depression or psychotic disturbances Certain medications for HIV or AIDS like efavirenz, lopinavir, nelfinavir, ritonavir Certain medications for irregular heart beat like propafenone, flecainide Certain medications for Parkinson's disease like amantadine, levodopa Certain medications for seizures like carbamazepine, phenytoin, phenobarbital Cimetidine Clopidogrel Cyclophosphamide Digoxin Furazolidone Isoniazid Nicotine Orphenadrine Procarbazine Steroid medications like prednisone or cortisone Stimulant medications for attention disorders, weight loss, or to stay awake Tamoxifen Theophylline Thiotepa Ticlopidine Tramadol Warfarin This list may not describe all possible interactions. Give your health care provider a list of all the medicines, herbs, non-prescription drugs, or dietary supplements you use. Also tell them if you smoke, drink alcohol, or use illegal drugs. Some items may interact with your medicine. What should I watch for while using this medication? Tell your care team  if your symptoms do not get better  or if they get worse. Visit your care team for regular checks on your progress. Because it may take several weeks to see the full effects of this medication, it is important to continue your treatment as prescribed. Watch for new or worsening thoughts of suicide or depression. This includes sudden changes in mood, behavior, or thoughts. These changes can happen at any time but are more common in the beginning of treatment or after a change in dose. Call your care team right away if you experience these thoughts or worsening depression. Manic episodes may happen in patients with bipolar disorder who take this medication. Watch for changes in feelings or behaviors such as feeling anxious, nervous, agitated, panicky, irritable, hostile, aggressive, impulsive, severely restless, overly excited and hyperactive, or trouble sleeping. These symptoms can happen at anytime but are more common in the beginning of treatment or after a change in dose. Call your care team right away if you notice any of these symptoms. This medication may cause serious skin reactions. They can happen weeks to months after starting the medication. Contact your care team right away if you notice fevers or flu-like symptoms with a rash. The rash may be red or purple and then turn into blisters or peeling of the skin. Or, you might notice a red rash with swelling of the face, lips or lymph nodes in your neck or under your arms. Avoid drinks that contain alcohol while taking this medication. Drinking large amounts of alcohol, using sleeping or anxiety medications, or quickly stopping the use of these agents while taking this medication may increase your risk for a seizure. Do not drive or use heavy machinery until you know how this medication affects you. This medication can impair your ability to perform these tasks. Do not take this medication close to bedtime. It may prevent you from sleeping. Your mouth  may get dry. Chewing sugarless gum or sucking hard candy, and drinking plenty of water may help. Contact your care team if the problem does not go away or is severe. What side effects may I notice from receiving this medication? Side effects that you should report to your care team as soon as possible: Allergic reactions--skin rash, itching, hives, swelling of the face, lips, tongue, or throat Increase in blood pressure Mood and behavior changes--anxiety, nervousness, confusion, hallucinations, irritability, hostility, thoughts of suicide or self-harm, worsening mood, feelings of depression Redness, blistering, peeling, or loosening of the skin, including inside the mouth Seizures Sudden eye pain or change in vision such as blurry vision, seeing halos around lights, vision loss Side effects that usually do not require medical attention (report to your care team if they continue or are bothersome): Constipation Dizziness Dry mouth Loss of appetite Nausea Tremors or shaking Trouble sleeping This list may not describe all possible side effects. Call your doctor for medical advice about side effects. You may report side effects to FDA at 1-800-FDA-1088. Where should I keep my medication? Keep out of the reach of children and pets. Store at room temperature between 20 and 25 degrees C (68 and 77 degrees F), away from direct sunlight and moisture. Keep tightly closed. Throw away any unused medication after the expiration date. NOTE: This sheet is a summary. It may not cover all possible information. If you have questions about this medicine, talk to your doctor, pharmacist, or health care provider.  2023 Elsevier/Gold Standard (2007-03-19 00:00:00)

## 2021-09-11 NOTE — ED Triage Notes (Signed)
Pt presents for 3rd rabies vaccine. She received her initial doses in [place] while on vacation.

## 2021-09-18 ENCOUNTER — Ambulatory Visit
Admission: EM | Admit: 2021-09-18 | Discharge: 2021-09-18 | Disposition: A | Discharge status: Home / Self Care | Payer: Medicare PPO | Attending: Physician Assistant | Admitting: Physician Assistant

## 2021-09-18 ENCOUNTER — Encounter: Payer: Self-pay | Admitting: Emergency Medicine

## 2021-09-18 DIAGNOSIS — Z203 Contact with and (suspected) exposure to rabies: Secondary | ICD-10-CM

## 2021-09-18 MED ORDER — RABIES VACCINE, PCEC IM SUSR
1.0000 mL | Freq: Once | INTRAMUSCULAR | Status: AC
  Administered 2021-09-18: 1 mL via INTRAMUSCULAR

## 2021-09-18 NOTE — ED Triage Notes (Signed)
Pt presents for her final rabies vaccine

## 2021-09-18 NOTE — ED Notes (Signed)
Pt presents for her final rabies vaccine.

## 2021-10-07 ENCOUNTER — Other Ambulatory Visit: Payer: Self-pay

## 2021-10-07 NOTE — Patient Outreach (Signed)
[facility]) Care Management  10/07/2021  Jordan Benson April 07, 1955 [id number]   Telephone Screen    Outreach call to patient to introduce [facility] services and assess care needs as part of benefit of PCP office and insurance plan. No answer. RN CM left HIPAA compliant voicemail message along with contact info.      Plan: RN CM will make outreach attempt to patient within 4 business days.   Enzo Montgomery, RN,BSN,CCM [place] Management Telephonic Care Management Coordinator Direct Phone: [phone] Toll Free: [phone] Fax: [phone]

## 2021-10-09 ENCOUNTER — Other Ambulatory Visit: Payer: Self-pay
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
## 2021-10-10 DIAGNOSIS — H43811 Vitreous degeneration, right eye: Secondary | ICD-10-CM | POA: Diagnosis not present

## 2021-10-21 ENCOUNTER — Ambulatory Visit (INDEPENDENT_AMBULATORY_CARE_PROVIDER_SITE_OTHER): Payer: Medicare PPO | Admitting: Psychiatry

## 2021-10-21 ENCOUNTER — Encounter: Payer: Self-pay | Admitting: Psychiatry

## 2021-10-21 VITALS — BP 116/79 | HR 95 | Ht 68.0 in | Wt 201.0 lb

## 2021-10-21 DIAGNOSIS — F339 Major depressive disorder, recurrent, unspecified: Secondary | ICD-10-CM

## 2021-10-21 DIAGNOSIS — F411 Generalized anxiety disorder: Secondary | ICD-10-CM

## 2021-10-21 DIAGNOSIS — F1021 Alcohol dependence, in remission: Secondary | ICD-10-CM | POA: Diagnosis not present

## 2021-10-21 NOTE — Progress Notes (Signed)
[place] MD OP Progress Note  10/21/2021 5:07 PM [name]  MRN:  [id number]  Chief Complaint:  Chief Complaint  Patient presents with   Follow-up: 66 year old Caucasian female with history of depression, anxiety, presented for medication management.   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  Patient today reports since the past few weeks she has been feeling better with regards to her depression.  She does not feel as hopeless as she used to before.  She is currently tolerating the Prozac and the Wellbutrin well.  Patient also reports anxiety symptoms as under control.  Patient reports sleep as good at night.  Does not believe sleep as excessive as it used to be since being on the Wellbutrin.  Denies any suicidality, homicidality or perceptual disturbances.  Reports she is currently trying to lose weight, may have lost 4 pounds since being on the Wellbutrin.  Planning to retire from her job sooner than she thought.  She reports she is planning to also spend some time at her beach house, looks forward to that.  Denies any other concerns today.  Visit Diagnosis:    ICD-10-CM   1. MDD (major depressive disorder), recurrent episode, with atypical features ([place])  F33.9    mild - improving    2. GAD (generalized anxiety disorder)  F41.1     3. Alcohol use disorder, moderate, in sustained remission ([place])  F10.21       Past Psychiatric History: Reviewed past psychiatric history from progress note on 09/11/2021.  Past Medical History:  Past Medical History:  Diagnosis Date   Anxiety    Breast cancer ([place]) 12/2017   hyperplasia only not cancer per pt   Complication of  anesthesia    nausea   GERD (gastroesophageal reflux disease)    Headache    optical migraines   Hyperlipidemia    Hypertension    IBS (irritable bowel syndrome)    Melanoma ([place]) 2019   followed by Dr Evorn Gong, on left arm   PONV (postoperative nausea and vomiting)    Vitamin D deficiency     Past Surgical History:  Procedure Laterality Date   ABDOMINAL HYSTERECTOMY  1992   BREAST BIOPSY Left 12/21/2017   affirm bx of asymmetry, ATYPICAL DUCTAL PAPILLARY LESION WITH ADJACENT ATYPICAL DUCTAL HYPERPLASIA.    BREAST BIOPSY Left 12/21/2017   [place] bx of LN, benign   BREAST LUMPECTOMY Left 01/05/2018   ADH   BREAST LUMPECTOMY Left 01/12/2018   neg   BREAST LUMPECTOMY WITH NEEDLE LOCALIZATION Left 01/05/2018   Procedure: BREAST LUMPECTOMY WITH NEEDLE LOCALIZATION;  Surgeon: Olean Ree, MD;  Location: [facility] ORS;  Service: General;  Laterality: Left;   BREAST SURGERY     COLONOSCOPY  2001, 2016   x2 ARM and at [place] Right 10/2017   TKR   MOHS SURGERY  2012   OVARIAN CYST REMOVAL  1991   POLYPECTOMY     RE-EXCISION OF BREAST LUMPECTOMY Left 01/12/2018   Procedure: RE-EXCISION OF BREAST LUMPECTOMY;  Surgeon: Olean Ree, MD;  Location: [facility] ORS;  Service: General;  Laterality: Left;   SEPTOPLASTY  2009   TOTAL KNEE 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   Procedure: RIGHT TOTAL KNEE ARTHROPLASTY;  Surgeon: Sydnee Cabal, MD;  Location: [facility]S;  Service: Orthopedics;  Laterality: Right;  2 hrs   TUBAL LIGATION  1985   tummy tuck  1995   WISDOM TOOTH EXTRACTION      Family Psychiatric History: Reviewed family psychiatric history from progress note on 09/11/2021.  Family History:  Family History  Problem Relation Age of Onset   Cancer Mother 60       breast cancer   Heart disease Mother    Hypertension Mother    Diabetes Mother    Breast cancer Mother 70   Heart disease Father    Depression Sister    Breast cancer Sister 36       on HRT   Colon polyps Sister     Alcohol abuse Maternal Grandfather    Alcohol abuse Paternal Grandfather    Alcohol abuse Paternal Grandmother    Anxiety disorder Daughter    Colon cancer Neg Hx    Esophageal cancer Neg Hx    Rectal cancer Neg Hx    Stomach cancer Neg Hx     Social History: Reviewed social history from progress note on 09/11/2021. Social History   Socioeconomic History   Marital status: Widowed    Spouse name: Iona Beard   Number of children: 2   Years of education: Not on file   Highest education level: Not on file  Occupational History    Employer: [employer]  Tobacco Use   Smoking status: Former    Packs/day: 0.50    Years: 10.00    Total pack years: 5.00    Types: Cigarettes    Quit date: 03/12/2000    Years since quitting: 21.6   Smokeless tobacco: Never  Vaping Use   Vaping Use: Never used  Substance and Sexual Activity   Alcohol use: Not Currently    Comment: wine daily- moderate use   Drug use: Never    Comment: gummies   Sexual activity: Not Currently  Other Topics Concern   Not on file  Social History Narrative   Not on file   Social Determinants of Health   Financial Resource Strain: Low Risk  (08/05/2021)   Overall Financial Resource Strain (CARDIA)    Difficulty of Paying Living Expenses: Not hard at all  Food Insecurity: No Food Insecurity (10/09/2021)   Hunger Vital Sign    Worried About Running Out of Food in the Last Year: Never true    Ran Out of Food in the Last Year: Never true  Transportation Needs: No Transportation Needs (10/09/2021)   PRAPARE - Hydrologist (Medical): No    Lack of Transportation (Non-Medical): No  Physical Activity: Sufficiently Active (08/05/2021)   Exercise Vital Sign    Days of Exercise per Week: 3 days    Minutes of Exercise per Session: 60 min  Stress: No Stress Concern Present (08/05/2021)   [place]    Feeling of Stress : Not at all  Social  Connections: Unknown (08/05/2021)   Social Connection and Isolation Panel [NHANES]    Frequency of Communication with Friends and Family: More than three times a week    Frequency of Social Gatherings with Friends and Family: More than three times a week    Attends Religious Services: Not on file    Active Member of Clubs or Organizations: Not on file    Attends Archivist Meetings: Not on file    Marital Status: Widowed    Allergies: No Known Allergies  Metabolic Disorder Labs: Lab Results  Component Value Date   HGBA1C 5.4 04/16/2021   No results found for: "PROLACTIN" Lab Results  Component Value Date   CHOL 143 04/16/2021   TRIG 150.0 (H) 04/16/2021   HDL 58.80 04/16/2021   CHOLHDL 2 04/16/2021   VLDL 30.0 04/16/2021   LDLCALC 54 04/16/2021   LDLCALC 60 12/12/2020   Lab Results  Component Value Date   TSH 2.25 12/12/2020   TSH 2.18 03/29/2020    Therapeutic Level Labs: No results found for: "LITHIUM" No results found for: "VALPROATE" No results found for: "CBMZ"  Current Medications: Current Outpatient Medications  Medication Sig Dispense Refill   amLODipine (NORVASC) 10 MG tablet TAKE 1 TABLET EVERY DAY 90 tablet 1   anastrozole (ARIMIDEX) 1 MG tablet Take 1 tablet (1 mg total) by mouth daily. 90 tablet 3   buPROPion (WELLBUTRIN) 75 MG tablet Take 1 tablet (75 mg total) by mouth in the morning. 90 tablet 0   CALCIUM-VITAMIN D PO Take by mouth.     enalapril (VASOTEC) 10 MG tablet TAKE 1 TABLET TWICE DAILY (Patient taking differently: Take 10 mg by mouth daily. TAKE 1 TABLET DAILY) 180 tablet 1   FLUoxetine (PROZAC) 40 MG capsule TAKE 1 CAPSULE EVERY DAY 90 capsule 1   Multiple Vitamins-Calcium (ONE-A-DAY WOMENS PO) Take 1 tablet by mouth daily.     rosuvastatin (CRESTOR) 10 MG tablet TAKE 1 TABLET EVERY DAY 90 tablet 1   No current facility-administered medications for this visit.     Musculoskeletal: Strength & Muscle Tone: within normal  limits Gait & Station: normal Patient leans: N/A  Psychiatric Specialty Exam: Review of Systems  Psychiatric/Behavioral:  Positive for dysphoric mood (Improved). The patient is nervous/anxious (Improving).   All other systems reviewed and are negative.   Blood pressure 116/79, pulse 95, height '5\' 8"'$  (1.727 m), weight 201 lb (91.2 kg).Body mass index is 30.56 kg/m.  General Appearance: Casual  Eye Contact:  Fair  Speech:  Clear and Coherent  Volume:  Normal  Mood:  Anxious and Depressed improving  Affect:  Full Range  Thought Process:  Goal Directed and Descriptions of Associations: Intact  Orientation:  Full (Time, Place, and Person)  Thought Content: Logical   Suicidal Thoughts:  No  Homicidal Thoughts:  No  Memory:  Immediate;   Fair Recent;   Fair Remote;   Fair  Judgement:  Fair  Insight:  Fair  Psychomotor Activity:  Normal  Concentration:  Concentration: Fair and Attention Span: Fair  Recall:  [employer] of Knowledge: Fair  Language: Fair  Akathisia:  No  Handed:  Right  AIMS (if indicated): not done  Assets:  Communication Skills Desire for Improvement Social Support Talents/Skills Transportation  ADL's:  Intact  Cognition: WNL  Sleep:  Fair   Screenings: [place] Visit from 09/11/2021 in [facility] Total Score 0      GAD-7    [place] Visit from 10/21/2021 in [facility] Office Visit from 09/11/2021 in [place]  Total GAD-7 Score 6 19      PHQ2-9    [place] Visit from 10/21/2021 in [facility] Visit from 09/11/2021 in [facility] from 08/05/2021 in [facility] Office Visit from 12/12/2020 in [place] Video Visit from 05/10/2020 in [facility]  PHQ-2 Total Score  2 2 0 0 4   PHQ-9 Total Score 8 8 -- -- [place] Office Visit from 10/21/2021 in [facility] from 09/18/2021 in [facility] [place] from 09/11/2021 in [facility] Low Risk No Risk Low Risk        Assessment and Plan: Jordan Benson is a 66 year old Caucasian female, widowed, employed, lives in [place], has a history of depression, anxiety, alcoholism in remission, hyperlipidemia, IBS, hypertension, hyperbilirubinemia, history of atypical ductal hyperplasia of breast currently on anastrozole, history of bilateral knee surgery, presented for medication management.  Patient is currently improving on the current medication regimen.  Plan as noted below.  Plan MDD with atypical features-improving Wellbutrin 75 mg p.o. daily Prozac 40 mg p.o. daily  GAD-improving Prozac 40 mg p.o. daily Patient was referred for CBT-pending  Alcohol use disorder in sustained remission Sober since March 2021.  Follow-up in clinic in 2 to 3 months or sooner if needed.  This note was generated in part or whole with voice recognition software. Voice recognition is usually quite accurate but there are transcription errors that can and very often do occur. I apologize for any typographical errors that were not detected and corrected.     Ursula Alert, MD 10/23/2021, 8:31 AM

## 2021-10-23 ENCOUNTER — Ambulatory Visit (INDEPENDENT_AMBULATORY_CARE_PROVIDER_SITE_OTHER): Payer: Medicare PPO | Admitting: Internal Medicine

## 2021-10-23 ENCOUNTER — Encounter: Payer: Self-pay | Admitting: Internal Medicine

## 2021-10-23 VITALS — BP 118/80 | HR 94 | Temp 98.7°F | Ht 68.0 in | Wt 200.8 lb

## 2021-10-23 DIAGNOSIS — Z0000 Encounter for general adult medical examination without abnormal findings: Secondary | ICD-10-CM

## 2021-10-23 DIAGNOSIS — Z131 Encounter for screening for diabetes mellitus: Secondary | ICD-10-CM

## 2021-10-23 DIAGNOSIS — Z23 Encounter for immunization: Secondary | ICD-10-CM

## 2021-10-23 DIAGNOSIS — Z803 Family history of malignant neoplasm of breast: Secondary | ICD-10-CM

## 2021-10-23 DIAGNOSIS — E7800 Pure hypercholesterolemia, unspecified: Secondary | ICD-10-CM | POA: Diagnosis not present

## 2021-10-23 DIAGNOSIS — R739 Hyperglycemia, unspecified: Secondary | ICD-10-CM | POA: Diagnosis not present

## 2021-10-23 DIAGNOSIS — Z8371 Family history of colonic polyps: Secondary | ICD-10-CM

## 2021-10-23 DIAGNOSIS — I10 Essential (primary) hypertension: Secondary | ICD-10-CM

## 2021-10-23 DIAGNOSIS — Z83719 Family history of colon polyps, unspecified: Secondary | ICD-10-CM

## 2021-10-23 DIAGNOSIS — F330 Major depressive disorder, recurrent, mild: Secondary | ICD-10-CM | POA: Diagnosis not present

## 2021-10-23 DIAGNOSIS — Z85820 Personal history of malignant melanoma of skin: Secondary | ICD-10-CM | POA: Diagnosis not present

## 2021-10-23 NOTE — Progress Notes (Signed)
Patient ID: AMOREENA NEUBERT, female   DOB: 04/07/1955, 66 y.o.   MRN: [id number]   Subjective:    Patient ID: [name], female    DOB: Oct 23, 1955, 66 y.o.   MRN: [id number]   Patient here for  Chief Complaint  Patient presents with   Annual Exam   .   HPI Reports she is doing well.  Feels good.  Staying active. No chest pain or sob reported.  Eating.  No nausea or vomiting reported.  No abdominal pain.  Retiring in January.  Doing well on prozac and wellbutrin.  No alcohol.   Past Medical History:  Diagnosis Date   Anxiety    Breast cancer ([place]) 12/2017   hyperplasia only not cancer per pt   Complication of anesthesia    nausea   GERD (gastroesophageal reflux disease)    Headache    optical migraines   Hyperlipidemia    Hypertension    IBS (irritable bowel syndrome)    Melanoma ([place]) 2019   followed by Dr Evorn Gong, on left arm   PONV (postoperative nausea and vomiting)    Vitamin D deficiency    Past Surgical History:  Procedure Laterality Date   ABDOMINAL HYSTERECTOMY  1992   BREAST BIOPSY Left 12/21/2017   affirm bx of asymmetry, ATYPICAL DUCTAL PAPILLARY LESION WITH ADJACENT ATYPICAL DUCTAL HYPERPLASIA.    BREAST BIOPSY Left 12/21/2017   [place] bx of LN, benign   BREAST LUMPECTOMY Left 01/05/2018   ADH   BREAST LUMPECTOMY Left 01/12/2018   neg   BREAST LUMPECTOMY WITH NEEDLE LOCALIZATION Left 01/05/2018   Procedure: BREAST LUMPECTOMY WITH NEEDLE LOCALIZATION;  Surgeon: Olean Ree, MD;  Location: [facility] ORS;  Service: General;  Laterality: Left;   BREAST SURGERY     COLONOSCOPY  2001, 2016   x2 ARM and at [place] Right 10/2017   TKR   MOHS SURGERY  2012   OVARIAN CYST REMOVAL  1991   POLYPECTOMY     RE-EXCISION OF BREAST LUMPECTOMY Left 01/12/2018   Procedure: RE-EXCISION OF BREAST LUMPECTOMY;  Surgeon: Olean Ree, MD;  Location: [facility] ORS;  Service: General;  Laterality: Left;   SEPTOPLASTY  2009   TOTAL KNEE ARTHROPLASTY Right  08/27/2017   Procedure: RIGHT TOTAL KNEE ARTHROPLASTY;  Surgeon: Sydnee Cabal, MD;  Location: [facility];  Service: Orthopedics;  Laterality: Right;  2 hrs   TUBAL LIGATION  1985   tummy tuck  1995   WISDOM TOOTH EXTRACTION     Family History  Problem Relation Age of Onset   Cancer Mother 47       breast cancer   Heart disease Mother    Hypertension Mother    Diabetes Mother    Breast cancer Mother 86   Heart disease Father    Depression Sister    Breast cancer Sister 76       on HRT   Colon polyps Sister    Alcohol abuse Maternal Grandfather    Alcohol abuse Paternal Grandfather    Alcohol abuse Paternal Grandmother    Anxiety disorder Daughter    Colon cancer Neg Hx    Esophageal cancer Neg Hx    Rectal cancer Neg Hx    Stomach cancer Neg Hx    Social History   Socioeconomic History   Marital status: Widowed    Spouse name: Iona Beard   Number of children: 2   Years of education: Not on file   Highest education level: Not  on file  Occupational History    Employer: [employer]  Tobacco Use   Smoking status: Former    Packs/day: 0.50    Years: 10.00    Total pack years: 5.00    Types: Cigarettes    Quit date: 03/12/2000    Years since quitting: 21.6   Smokeless tobacco: Never  Vaping Use   Vaping Use: Never used  Substance and Sexual Activity   Alcohol use: Not Currently    Comment: wine daily- moderate use   Drug use: Never    Comment: gummies   Sexual activity: Not Currently  Other Topics Concern   Not on file  Social History Narrative   Not on file   Social Determinants of Health   Financial Resource Strain: Low Risk  (08/05/2021)   Overall Financial Resource Strain (CARDIA)    Difficulty of Paying Living Expenses: Not hard at all  Food Insecurity: No Food Insecurity (10/09/2021)   Hunger Vital Sign    Worried About Running Out of Food in the Last Year: Never true    Ran Out of Food in the Last Year: Never true  Transportation Needs: No Transportation Needs  (10/09/2021)   PRAPARE - Hydrologist (Medical): No    Lack of Transportation (Non-Medical): No  Physical Activity: Sufficiently Active (08/05/2021)   Exercise Vital Sign    Days of Exercise per Week: 3 days    Minutes of Exercise per Session: 60 min  Stress: No Stress Concern Present (08/05/2021)   [place]    Feeling of Stress : Not at all  Social Connections: Unknown (08/05/2021)   Social Connection and Isolation Panel [NHANES]    Frequency of Communication with Friends and Family: More than three times a week    Frequency of Social Gatherings with Friends and Family: More than three times a week    Attends Religious Services: Not on file    Active Member of Clubs or Organizations: Not on file    Attends Archivist Meetings: Not on file    Marital Status: Widowed     Review of Systems  Constitutional:  Negative for appetite change and unexpected weight change.  HENT:  Negative for congestion, sinus pressure and sore throat.   Eyes:  Negative for pain and visual disturbance.  Respiratory:  Negative for cough, chest tightness and shortness of breath.   Cardiovascular:  Negative for chest pain, palpitations and leg swelling.  Gastrointestinal:  Negative for abdominal pain, diarrhea, nausea and vomiting.  Genitourinary:  Negative for difficulty urinating and dysuria.  Musculoskeletal:  Negative for joint swelling and myalgias.  Skin:  Negative for color change and rash.  Neurological:  Negative for dizziness, light-headedness and headaches.  Hematological:  Negative for adenopathy. Does not bruise/bleed easily.  Psychiatric/Behavioral:  Negative for agitation and dysphoric mood.        Objective:     BP 118/80 (BP Location: Left Arm, Patient Position: Sitting, Cuff Size: Normal)   Pulse 94   Temp 98.7 F (37.1 C) (Oral)   Ht $R'5\' 8"'Mq$  (1.727 m)   Wt 200 lb 12.8 oz (91.1 kg)    SpO2 96%   BMI 30.53 kg/m  Wt Readings from Last 3 Encounters:  10/23/21 200 lb 12.8 oz (91.1 kg)  08/05/21 200 lb 1.6 oz (90.8 kg)  04/16/21 206 lb 6.4 oz (93.6 kg)    Physical Exam Vitals reviewed.  Constitutional:  General: She is not in acute distress.    Appearance: Normal appearance. She is well-developed.  HENT:     Head: Normocephalic and atraumatic.     Right Ear: External ear normal.     Left Ear: External ear normal.  Eyes:     General: No scleral icterus.       Right eye: No discharge.        Left eye: No discharge.     Conjunctiva/sclera: Conjunctivae normal.  Neck:     Thyroid: No thyromegaly.  Cardiovascular:     Rate and Rhythm: Normal rate and regular rhythm.  Pulmonary:     Effort: No tachypnea, accessory muscle usage or respiratory distress.     Breath sounds: Normal breath sounds. No decreased breath sounds or wheezing.  Chest:  Breasts:    Right: No inverted nipple, mass, nipple discharge or tenderness (no axillary adenopathy).     Left: No inverted nipple, mass, nipple discharge or tenderness (no axilarry adenopathy).  Abdominal:     General: Bowel sounds are normal.     Palpations: Abdomen is soft.     Tenderness: There is no abdominal tenderness.  Musculoskeletal:        General: No swelling or tenderness.     Cervical back: Neck supple.  Lymphadenopathy:     Cervical: No cervical adenopathy.  Skin:    Findings: No erythema or rash.  Neurological:     Mental Status: She is alert and oriented to person, place, and time.  Psychiatric:        Mood and Affect: Mood normal.        Behavior: Behavior normal.      Outpatient Encounter Medications as of 10/23/2021  Medication Sig   amLODipine (NORVASC) 10 MG tablet TAKE 1 TABLET EVERY DAY   anastrozole (ARIMIDEX) 1 MG tablet Take 1 tablet (1 mg total) by mouth daily.   buPROPion (WELLBUTRIN) 75 MG tablet Take 1 tablet (75 mg total) by mouth in the morning.   CALCIUM-VITAMIN D PO Take by  mouth.   enalapril (VASOTEC) 10 MG tablet TAKE 1 TABLET TWICE DAILY (Patient taking differently: Take 10 mg by mouth daily. TAKE 1 TABLET DAILY)   FLUoxetine (PROZAC) 40 MG capsule TAKE 1 CAPSULE EVERY DAY   Multiple Vitamins-Calcium (ONE-A-DAY WOMENS PO) Take 1 tablet by mouth daily.   rosuvastatin (CRESTOR) 10 MG tablet TAKE 1 TABLET EVERY DAY   No facility-administered encounter medications on file as of 10/23/2021.     Lab Results  Component Value Date   WBC 7.1 04/16/2021   HGB 14.2 04/16/2021   HCT 41.6 04/16/2021   PLT 316.0 04/16/2021   GLUCOSE 95 04/16/2021   CHOL 143 04/16/2021   TRIG 150.0 (H) 04/16/2021   HDL 58.80 04/16/2021   LDLDIRECT 138.0 03/20/2019   LDLCALC 54 04/16/2021   ALT 16 04/16/2021   AST 21 04/16/2021   NA 141 04/16/2021   K 4.2 04/16/2021   CL 101 04/16/2021   CREATININE 0.80 04/16/2021   BUN 15 04/16/2021   CO2 29 04/16/2021   TSH 2.25 12/12/2020   INR 0.94 08/13/2017   HGBA1C 5.4 04/16/2021       Assessment & Plan:   Problem List Items Addressed This Visit     Essential hypertension, benign    Blood pressure doing well on enalapril, triam/hctz and amlodipine.  Taking enalapril q day. Continue current medication regimen.  Follow pressures.  Follow metabolic panel.       Relevant  Orders   Basic Metabolic Panel (BMET)   CBC   Family history of breast cancer    Mother and sister with breast cancer.  Sees oncology.  On arimidex.        Family history of colonic polyps    Colonoscopy 12/04/19 - several polyps present and internal hemorrhoids.  Followed by Dr Lucio Edward.       Health care maintenance    Physical today 10/23/21.  Mammogram 03/06/21 - Birads I.  Colonoscopy 12/04/19 - one 40mm polyp in the transverse colon and 4 (6-52mm) polyps in sigmoid colon, internal hemorrhoids       History of melanoma    Followed by dermatology.        Hypercholesterolemia    On crestor.  Low cholesterol diet and exercise.  Follow lipid panel  and liver function tests.   Lab Results  Component Value Date   CHOL 143 04/16/2021   HDL 58.80 04/16/2021   LDLCALC 54 04/16/2021   LDLDIRECT 138.0 03/20/2019   TRIG 150.0 (H) 04/16/2021   CHOLHDL 2 04/16/2021       Relevant Orders   Lipid panel   Hyperglycemia    Low carb diet and exercise.  Follow met b and a1c.   Lab Results  Component Value Date   HGBA1C 5.4 04/16/2021       MDD (major depressive disorder), recurrent episode, mild (HCC)    Overall doing well.  Has been followed by psychiatry.  This has gone well.  Prozac and wellbutrin - working well.  Follow.       Other Visit Diagnoses     Routine general medical examination at a health care facility    -  Primary   Relevant Orders   Hepatic function panel   Need for immunization against influenza       Relevant Orders   Flu Vaccine QUAD High Dose(Fluad) (Completed)   Diabetes mellitus screening       Relevant Orders   HgB A1c        Einar Pheasant, MD

## 2021-10-30 DIAGNOSIS — D0359 Melanoma in situ of other part of trunk: Secondary | ICD-10-CM | POA: Diagnosis not present

## 2021-10-30 DIAGNOSIS — Z86006 Personal history of melanoma in-situ: Secondary | ICD-10-CM | POA: Diagnosis not present

## 2021-10-30 DIAGNOSIS — D2271 Melanocytic nevi of right lower limb, including hip: Secondary | ICD-10-CM | POA: Diagnosis not present

## 2021-10-30 DIAGNOSIS — D225 Melanocytic nevi of trunk: Secondary | ICD-10-CM | POA: Diagnosis not present

## 2021-10-30 DIAGNOSIS — D2261 Melanocytic nevi of right upper limb, including shoulder: Secondary | ICD-10-CM | POA: Diagnosis not present

## 2021-10-30 DIAGNOSIS — L570 Actinic keratosis: Secondary | ICD-10-CM | POA: Diagnosis not present

## 2021-10-30 DIAGNOSIS — Z85828 Personal history of other malignant neoplasm of skin: Secondary | ICD-10-CM | POA: Diagnosis not present

## 2021-10-30 DIAGNOSIS — D2262 Melanocytic nevi of left upper limb, including shoulder: Secondary | ICD-10-CM | POA: Diagnosis not present

## 2021-10-30 DIAGNOSIS — D485 Neoplasm of uncertain behavior of skin: Secondary | ICD-10-CM | POA: Diagnosis not present

## 2021-10-30 DIAGNOSIS — Z85820 Personal history of malignant melanoma of skin: Secondary | ICD-10-CM | POA: Diagnosis not present

## 2021-11-02 ENCOUNTER — Encounter: Payer: Self-pay | Admitting: Internal Medicine

## 2021-11-02 NOTE — Assessment & Plan Note (Signed)
On crestor.  Low cholesterol diet and exercise.  Follow lipid panel and liver function tests.   Lab Results  Component Value Date   CHOL 143 04/16/2021   HDL 58.80 04/16/2021   LDLCALC 54 04/16/2021   LDLDIRECT 138.0 03/20/2019   TRIG 150.0 (H) 04/16/2021   CHOLHDL 2 04/16/2021

## 2021-11-02 NOTE — Assessment & Plan Note (Signed)
Overall doing well.  Has been followed by psychiatry.  This has gone well.  Prozac and wellbutrin - working well.  Follow.

## 2021-11-02 NOTE — Assessment & Plan Note (Addendum)
Physical today 10/23/21.  Mammogram 03/06/21 - Birads I.  Colonoscopy 12/04/19 - one 55m polyp in the transverse colon and 4 (6-815m polyps in sigmoid colon, internal hemorrhoids

## 2021-11-02 NOTE — Assessment & Plan Note (Signed)
Low carb diet and exercise.  Follow met b and a1c.   Lab Results  Component Value Date   HGBA1C 5.4 04/16/2021

## 2021-11-02 NOTE — Assessment & Plan Note (Signed)
Followed by dermatology

## 2021-11-02 NOTE — Assessment & Plan Note (Signed)
Colonoscopy 12/04/19 - several polyps present and internal hemorrhoids.  Followed by Dr Lucio Edward.

## 2021-11-02 NOTE — Assessment & Plan Note (Signed)
Mother and sister with breast cancer.  Sees oncology.  On arimidex.

## 2021-11-02 NOTE — Assessment & Plan Note (Signed)
Blood pressure doing well on enalapril, triam/hctz and amlodipine.  Taking enalapril q day. Continue current medication regimen.  Follow pressures.  Follow metabolic panel.

## 2021-11-05 DIAGNOSIS — D0359 Melanoma in situ of other part of trunk: Secondary | ICD-10-CM | POA: Diagnosis not present

## 2021-11-07 ENCOUNTER — Encounter: Payer: Self-pay | Admitting: Internal Medicine

## 2021-11-07 DIAGNOSIS — D039 Melanoma in situ, unspecified: Secondary | ICD-10-CM | POA: Insufficient documentation

## 2021-11-10 ENCOUNTER — Other Ambulatory Visit (INDEPENDENT_AMBULATORY_CARE_PROVIDER_SITE_OTHER): Payer: Medicare PPO

## 2021-11-10 ENCOUNTER — Encounter: Payer: Self-pay | Admitting: Internal Medicine

## 2021-11-10 DIAGNOSIS — E7800 Pure hypercholesterolemia, unspecified: Secondary | ICD-10-CM | POA: Diagnosis not present

## 2021-11-10 DIAGNOSIS — I10 Essential (primary) hypertension: Secondary | ICD-10-CM

## 2021-11-10 DIAGNOSIS — Z131 Encounter for screening for diabetes mellitus: Secondary | ICD-10-CM | POA: Diagnosis not present

## 2021-11-10 DIAGNOSIS — Z0000 Encounter for general adult medical examination without abnormal findings: Secondary | ICD-10-CM | POA: Diagnosis not present

## 2021-11-10 LAB — BASIC METABOLIC PANEL
BUN: 23 mg/dL (ref 6–23)
CO2: 27 mEq/L (ref 19–32)
Calcium: 9.7 mg/dL (ref 8.4–10.5)
Chloride: 102 mEq/L (ref 96–112)
Creatinine, Ser: 1.02 mg/dL (ref 0.40–1.20)
GFR: 56.79 mL/min — ABNORMAL LOW (ref >60.00)
Glucose, Bld: 97 mg/dL (ref 70–99)
Potassium: 3.7 mEq/L (ref 3.5–5.1)
Sodium: 140 mEq/L (ref 135–145)

## 2021-11-10 LAB — CBC
HCT: 38.4 % (ref 36.0–46.0)
Hemoglobin: 13.4 g/dL (ref 12.0–15.0)
MCHC: 34.8 g/dL (ref 30.0–36.0)
MCV: 88.6 fl (ref 78.0–100.0)
Platelets: 255 10*3/uL (ref 150.0–400.0)
RBC: 4.33 Mil/uL (ref 3.87–5.11)
RDW: 12.4 % (ref 11.5–15.5)
WBC: 4.3 10*3/uL (ref 4.0–10.5)

## 2021-11-10 LAB — HEPATIC FUNCTION PANEL
ALT: 22 U/L (ref 0–35)
AST: 20 U/L (ref 0–37)
Albumin: 4.2 g/dL (ref 3.5–5.2)
Alkaline Phosphatase: 67 U/L (ref 39–117)
Bilirubin, Direct: 0.2 mg/dL (ref 0.0–0.3)
Total Bilirubin: 1 mg/dL (ref 0.2–1.2)
Total Protein: 6.9 g/dL (ref 6.0–8.3)

## 2021-11-10 LAB — LIPID PANEL
Cholesterol: 125 mg/dL (ref 0–200)
HDL: 50.5 mg/dL (ref >39.00)
LDL Cholesterol: 49 mg/dL (ref 0–99)
NonHDL: 74.33
Total CHOL/HDL Ratio: 2
Triglycerides: 129 mg/dL (ref 0.0–149.0)
VLDL: 25.8 mg/dL (ref 0.0–40.0)

## 2021-11-10 LAB — HEMOGLOBIN A1C: Hgb A1c MFr Bld: 5.6 % (ref 4.6–6.5)

## 2021-11-11 ENCOUNTER — Other Ambulatory Visit: Payer: Self-pay

## 2021-11-11 DIAGNOSIS — R944 Abnormal results of kidney function studies: Secondary | ICD-10-CM

## 2021-11-11 NOTE — Telephone Encounter (Signed)
See lab result note.

## 2021-11-19 ENCOUNTER — Other Ambulatory Visit: Payer: Self-pay

## 2021-11-19 DIAGNOSIS — F339 Major depressive disorder, recurrent, unspecified: Secondary | ICD-10-CM

## 2021-11-19 MED ORDER — BUPROPION HCL 75 MG PO TABS: 75.0000 mg | ORAL_TABLET | Freq: Every morning | ORAL | 0 refills | Status: DC

## 2021-11-20 ENCOUNTER — Telehealth: Payer: Self-pay | Admitting: *Deleted

## 2021-11-20 ENCOUNTER — Other Ambulatory Visit (INDEPENDENT_AMBULATORY_CARE_PROVIDER_SITE_OTHER): Payer: Medicare PPO

## 2021-11-20 DIAGNOSIS — R944 Abnormal results of kidney function studies: Secondary | ICD-10-CM

## 2021-11-20 DIAGNOSIS — E559 Vitamin D deficiency, unspecified: Secondary | ICD-10-CM

## 2021-11-20 LAB — BASIC METABOLIC PANEL
BUN: 17 mg/dL (ref 6–23)
CO2: 27 mEq/L (ref 19–32)
Calcium: 9.5 mg/dL (ref 8.4–10.5)
Chloride: 100 mEq/L (ref 96–112)
Creatinine, Ser: 0.8 mg/dL (ref 0.40–1.20)
GFR: 76 mL/min (ref >60.00)
Glucose, Bld: 90 mg/dL (ref 70–99)
Potassium: 4.1 mEq/L (ref 3.5–5.1)
Sodium: 137 mEq/L (ref 135–145)

## 2021-11-20 NOTE — Telephone Encounter (Signed)
Pt came in for labs this morning & would like to know if you could add a Vit D also? Pt states she has been looking online at some things & just wanted it tested.

## 2021-11-20 NOTE — Telephone Encounter (Signed)
Add on sheet faxed 

## 2021-11-20 NOTE — Telephone Encounter (Signed)
Vitamin D lab ordered.

## 2021-11-21 ENCOUNTER — Other Ambulatory Visit: Payer: Self-pay

## 2021-11-21 ENCOUNTER — Other Ambulatory Visit (INDEPENDENT_AMBULATORY_CARE_PROVIDER_SITE_OTHER): Payer: Medicare PPO

## 2021-11-21 DIAGNOSIS — E559 Vitamin D deficiency, unspecified: Secondary | ICD-10-CM | POA: Diagnosis not present

## 2021-11-21 LAB — VITAMIN D 25 HYDROXY (VIT D DEFICIENCY, FRACTURES): VITD: 30.52 ng/mL (ref 30.00–100.00)

## 2022-02-10 ENCOUNTER — Other Ambulatory Visit: Payer: Self-pay | Admitting: Internal Medicine

## 2022-02-17 ENCOUNTER — Encounter: Payer: Self-pay | Admitting: Internal Medicine

## 2022-02-17 NOTE — Telephone Encounter (Signed)
Pt sched w/ Ollen Gross Thursday 8am

## 2022-02-19 ENCOUNTER — Ambulatory Visit: Payer: Medicare PPO | Admitting: Nurse Practitioner

## 2022-02-19 ENCOUNTER — Encounter: Payer: Self-pay | Admitting: Nurse Practitioner

## 2022-02-19 VITALS — BP 116/70 | HR 91 | Temp 98.1°F | Ht 68.0 in | Wt 198.4 lb

## 2022-02-19 DIAGNOSIS — J019 Acute sinusitis, unspecified: Secondary | ICD-10-CM | POA: Diagnosis not present

## 2022-02-19 MED ORDER — AMOXICILLIN-POT CLAVULANATE 875-125 MG PO TABS: 1.0000 | ORAL_TABLET | Freq: Two times a day (BID) | ORAL | 0 refills | Status: DC

## 2022-02-19 NOTE — Progress Notes (Signed)
Jordan Morrow, NP-C Phone: [phone]  TIM Jordan Benson is a 67 y.o. female who presents today for possible sinus infection. She was diagnosed with the flu approximately 2 weeks ago and has had lingering cough and drainage. She reports increased facial and sinus pressure and left ear pressure.   Respiratory illness:  Cough- Yes  Congestion-    Sinus- Yes, nasal drainage   Chest- No  Post nasal drip- Yes  Sore throat- No  Shortness of breath- No  Fever- No  Fatigue/Myalgia- Yes Headache- No Nausea/Vomiting- No Taste disturbance- No  Smell disturbance- No  Covid vaccination- x 3  Flu vaccination- UTD  Medications- Coricidin OTC   Social History   Tobacco Use  Smoking Status Former   Packs/day: 0.50   Years: 10.00   Total pack years: 5.00   Types: Cigarettes   Quit date: 03/12/2000   Years since quitting: 21.9  Smokeless Tobacco Never    Current Outpatient Medications on File Prior to Visit  Medication Sig Dispense Refill   amLODipine (NORVASC) 10 MG tablet TAKE 1 TABLET EVERY DAY 90 tablet 1   anastrozole (ARIMIDEX) 1 MG tablet Take 1 tablet (1 mg total) by mouth daily. 90 tablet 3   buPROPion (WELLBUTRIN) 75 MG tablet Take 1 tablet (75 mg total) by mouth in the morning. 90 tablet 0   CALCIUM-VITAMIN D PO Take by mouth.     enalapril (VASOTEC) 10 MG tablet TAKE 1 TABLET TWICE DAILY 180 tablet 3   FLUoxetine (PROZAC) 40 MG capsule TAKE 1 CAPSULE EVERY DAY 90 capsule 1   Multiple Vitamins-Calcium (ONE-A-DAY WOMENS PO) Take 1 tablet by mouth daily.     rosuvastatin (CRESTOR) 10 MG tablet TAKE 1 TABLET EVERY DAY 90 tablet 1   No current facility-administered medications on file prior to visit.     ROS see history of present illness  Objective  Physical Exam Vitals:   02/19/22 0810  BP: 116/70  Pulse: 91  Temp: 98.1 F (36.7 C)  SpO2: 98%    BP Readings from Last 3 Encounters:  02/19/22 116/70  10/23/21 118/80  09/18/21 135/78   Wt Readings from Last 3  Encounters:  02/19/22 198 lb 6.4 oz (90 kg)  10/23/21 200 lb 12.8 oz (91.1 kg)  08/05/21 200 lb 1.6 oz (90.8 kg)    Physical Exam Constitutional:      General: She is not in acute distress.    Appearance: Normal appearance.  HENT:     Head: Normocephalic.     Right Ear: Tympanic membrane normal.     Left Ear: Tympanic membrane is injected and bulging.     Nose: Nose normal.     Mouth/Throat:     Mouth: Mucous membranes are moist.     Pharynx: Oropharynx is clear.  Eyes:     Conjunctiva/sclera: Conjunctivae normal.     Pupils: Pupils are equal, round, and reactive to light.  Cardiovascular:     Rate and Rhythm: Normal rate and regular rhythm.     Heart sounds: Normal heart sounds.  Pulmonary:     Effort: Pulmonary effort is normal.     Breath sounds: Normal breath sounds.  Skin:    General: Skin is warm and dry.  Neurological:     Mental Status: She is alert.  Psychiatric:        Mood and Affect: Mood normal.        Behavior: Behavior normal.    Assessment/Plan: Please see individual problem list.  Acute  non-recurrent sinusitis, unspecified location Assessment & Plan: Will treat with Augmentin 875 BID x 10 days. Encouraged patient to complete entire course of medication and stay adequately hydrated. Declined medication for cough, can use OTC Robitussin/Delsym if needed.   Orders: -     Amoxicillin-Pot Clavulanate; Take 1 tablet by mouth 2 (two) times daily.  Dispense: 20 tablet; Refill: 0   Return if symptoms worsen or fail to improve.   Jordan Morrow, NP-C [place]

## 2022-02-19 NOTE — Assessment & Plan Note (Addendum)
Will treat with Augmentin 875 BID x 10 days. Encouraged patient to complete entire course of medication and stay adequately hydrated. Declined medication for cough, can use OTC Robitussin/Delsym if needed.

## 2022-02-25 ENCOUNTER — Encounter: Payer: Self-pay | Admitting: Nurse Practitioner

## 2022-03-05 ENCOUNTER — Other Ambulatory Visit: Payer: Self-pay | Admitting: Internal Medicine

## 2022-03-11 ENCOUNTER — Ambulatory Visit
Admission: RE | Admit: 2022-03-11 | Discharge: 2022-03-11 | Disposition: A | Discharge status: Home / Self Care | Payer: Medicare PPO | Source: Ambulatory Visit | Attending: Internal Medicine | Admitting: Internal Medicine

## 2022-03-11 DIAGNOSIS — N6092 Unspecified benign mammary dysplasia of left breast: Secondary | ICD-10-CM | POA: Insufficient documentation

## 2022-03-11 DIAGNOSIS — M8589 Other specified disorders of bone density and structure, multiple sites: Secondary | ICD-10-CM | POA: Insufficient documentation

## 2022-03-11 DIAGNOSIS — Z1231 Encounter for screening mammogram for malignant neoplasm of breast: Secondary | ICD-10-CM | POA: Insufficient documentation

## 2022-03-11 DIAGNOSIS — Z780 Asymptomatic menopausal state: Secondary | ICD-10-CM | POA: Diagnosis not present

## 2022-03-11 DIAGNOSIS — E559 Vitamin D deficiency, unspecified: Secondary | ICD-10-CM | POA: Insufficient documentation

## 2022-03-16 DIAGNOSIS — F411 Generalized anxiety disorder: Secondary | ICD-10-CM | POA: Diagnosis not present

## 2022-03-24 ENCOUNTER — Inpatient Hospital Stay: Payer: Medicare PPO | Admitting: Internal Medicine

## 2022-03-25 DIAGNOSIS — F411 Generalized anxiety disorder: Secondary | ICD-10-CM | POA: Diagnosis not present

## 2022-03-27 ENCOUNTER — Inpatient Hospital Stay: Payer: Medicare PPO | Attending: Internal Medicine | Admitting: Internal Medicine

## 2022-03-27 ENCOUNTER — Encounter: Payer: Self-pay | Admitting: Internal Medicine

## 2022-03-27 ENCOUNTER — Ambulatory Visit: Payer: Medicare PPO | Admitting: Internal Medicine

## 2022-03-27 DIAGNOSIS — N6092 Unspecified benign mammary dysplasia of left breast: Secondary | ICD-10-CM

## 2022-03-27 DIAGNOSIS — Z780 Asymptomatic menopausal state: Secondary | ICD-10-CM | POA: Diagnosis not present

## 2022-03-27 DIAGNOSIS — M8580 Other specified disorders of bone density and structure, unspecified site: Secondary | ICD-10-CM | POA: Diagnosis not present

## 2022-03-27 NOTE — Progress Notes (Signed)
Patient verified using two identifiers for virtual visit via telephone today.  Patient denies new problems/concerns today.   

## 2022-03-27 NOTE — Progress Notes (Signed)
I connected with Jordan Benson on 03/27/22 at  3:30 PM EST by video enabled telemedicine visit and verified that I am speaking with the correct person using two identifiers.  I discussed the limitations, risks, security and privacy concerns of performing an evaluation and management service by telemedicine and the availability of in-person appointments. I also discussed with the patient that there may be a patient responsible charge related to this service. The patient expressed understanding and agreed to proceed.    Other persons participating in the visit and their role in the encounter: RN/medical reconciliation Patient's location: home Provider's location: office  Oncology History   No history exists.   Chief Complaint: atypical Ductal hyperplasia.    History of present illness:Jordan Benson 67 y.o.  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  Patient continues to be anastrozole.  No major side effects.  Mild joint pains not any worse.  She is currently retired.  Observation/objective: Alert & oriented x 3. In No acute distress.   Assessment and plan: Atypical ductal hyperplasia of left breast #Left breast atypical ductal hyperplasia [January 2021-Arimidex ]currently on antihormone therapy with armidex 52m/day. MARCH 2024- Mammo-WNL.   Tolerating well without any major side effects. No mammographic evidence of malignancy.  Discussed that she would need to be on AI for total of 5 years until January 2026.  # MSK G-1 vs. Osteoarthritis-monitor for now. Stable.   # BMD march 2020- Osteopenia- T score= -1.9.  continue calcium plus vitamin D [1000+1000/day]. JAN 2024- T score= -2.2- WORSE. Discussed the potential risk factors for osteoporosis- age/gender/postmenopausal status/use of anti-estrogen treatments. Discussed multiple options including exercise/ calcium and vitamin D  supplementation/ and also use of bisphosphonates. Discussed oral bisphosphonates versus parenteral bisphosphonate like Reclast. Discussed the potential benefits and/side effects  Including but not limited to Osteonecrosis of jaw/ hypocalcemia.   Las with PCP on march 14th  # DISPOSITION: # Schedule Reclast 18th week of March 2024  # follow up in end of March 2025-MD: labs- cbc/cmp; vit D; possible reclast-;  Dr.B  I discussed the assessment and treatment plan with the patient.  The patient was provided an opportunity to ask questions and all were answered.  The patient agreed with the plan and demonstrated understanding of instructions.  The patient was advised to call back or seek an in person evaluation if the symptoms worsen or if the condition fails to improve as anticipated.  Dr. GCharlaine DaltonCH[facility]2/16/2024 3:59 PM

## 2022-03-27 NOTE — Assessment & Plan Note (Addendum)
#  Left breast atypical ductal hyperplasia [January 2021-Arimidex ]currently on antihormone therapy with armidex 17m/day. MARCH 2024- Mammo-WNL.   Tolerating well without any major side effects. No mammographic evidence of malignancy.  Discussed that she would need to be on AI for total of 5 years until January 2026.  # MSK G-1 vs. Osteoarthritis-monitor for now. Stable.   # BMD march 2020- Osteopenia- T score= -1.9.  continue calcium plus vitamin D [1000+1000/day]. JAN 2024- T score= -2.2- WORSE. Discussed the potential risk factors for osteoporosis- age/gender/postmenopausal status/use of anti-estrogen treatments. Discussed multiple options including exercise/ calcium and vitamin D supplementation/ and also use of bisphosphonates. Discussed oral bisphosphonates versus parenteral bisphosphonate like Reclast. Discussed the potential benefits and/side effects  Including but not limited to Osteonecrosis of jaw/ hypocalcemia.   Las with PCP on march 14th  # DISPOSITION: # Schedule Reclast 18th week of March 2024  # follow up in end of March 2025-MD: labs- cbc/cmp; vit D; possible reclast-;  Dr.B

## 2022-04-13 DIAGNOSIS — F411 Generalized anxiety disorder: Secondary | ICD-10-CM | POA: Diagnosis not present

## 2022-04-14 ENCOUNTER — Encounter: Payer: Self-pay | Admitting: Internal Medicine

## 2022-04-14 DIAGNOSIS — I10 Essential (primary) hypertension: Secondary | ICD-10-CM

## 2022-04-14 DIAGNOSIS — R739 Hyperglycemia, unspecified: Secondary | ICD-10-CM

## 2022-04-14 DIAGNOSIS — E7800 Pure hypercholesterolemia, unspecified: Secondary | ICD-10-CM

## 2022-04-14 NOTE — Telephone Encounter (Signed)
Dr Rogue Bussing has ordered a vitamin D level, cbc and met c.  Is it possible for [place] to draw these at our office.  If so, then I would like for these labs to be drawn and also check A1c, tsh and lipid panel add to them.  If not able to draw Dr Aletha Halim labs, then please draw cbc, met c, tsh, vitamin D level, A1c and lipid panel prior to her appt.

## 2022-04-15 NOTE — Telephone Encounter (Signed)
Spoke with patient. She has decided to hold off on infusion so does not need Dr Sharmaine Base labs. I have ordered routine fasting labs and scheduled patient for a lab appt prior to her appointment on 3/14

## 2022-04-20 ENCOUNTER — Other Ambulatory Visit (INDEPENDENT_AMBULATORY_CARE_PROVIDER_SITE_OTHER): Payer: Medicare PPO

## 2022-04-20 DIAGNOSIS — R739 Hyperglycemia, unspecified: Secondary | ICD-10-CM | POA: Diagnosis not present

## 2022-04-20 DIAGNOSIS — I10 Essential (primary) hypertension: Secondary | ICD-10-CM | POA: Diagnosis not present

## 2022-04-20 DIAGNOSIS — E7800 Pure hypercholesterolemia, unspecified: Secondary | ICD-10-CM | POA: Diagnosis not present

## 2022-04-20 LAB — CBC WITH DIFFERENTIAL/PLATELET
Basophils Absolute: 0.1 10*3/uL (ref 0.0–0.1)
Basophils Relative: 0.9 % (ref 0.0–3.0)
Eosinophils Absolute: 0.1 10*3/uL (ref 0.0–0.7)
Eosinophils Relative: 1.8 % (ref 0.0–5.0)
HCT: 41 % (ref 36.0–46.0)
Hemoglobin: 14.1 g/dL (ref 12.0–15.0)
Lymphocytes Relative: 25 % (ref 12.0–46.0)
Lymphs Abs: 1.7 10*3/uL (ref 0.7–4.0)
MCHC: 34.4 g/dL (ref 30.0–36.0)
MCV: 88.7 fl (ref 78.0–100.0)
Monocytes Absolute: 0.4 10*3/uL (ref 0.1–1.0)
Monocytes Relative: 5.4 % (ref 3.0–12.0)
Neutro Abs: 4.5 10*3/uL (ref 1.4–7.7)
Neutrophils Relative %: 66.9 % (ref 43.0–77.0)
Platelets: 324 10*3/uL (ref 150.0–400.0)
RBC: 4.62 Mil/uL (ref 3.87–5.11)
RDW: 12.6 % (ref 11.5–15.5)
WBC: 6.7 10*3/uL (ref 4.0–10.5)

## 2022-04-20 LAB — TSH: TSH: 1.55 u[IU]/mL (ref 0.35–5.50)

## 2022-04-20 LAB — HEPATIC FUNCTION PANEL
ALT: 13 U/L (ref 0–35)
AST: 16 U/L (ref 0–37)
Albumin: 4.2 g/dL (ref 3.5–5.2)
Alkaline Phosphatase: 62 U/L (ref 39–117)
Bilirubin, Direct: 0.3 mg/dL (ref 0.0–0.3)
Total Bilirubin: 1.8 mg/dL — ABNORMAL HIGH (ref 0.2–1.2)
Total Protein: 6.7 g/dL (ref 6.0–8.3)

## 2022-04-20 LAB — BASIC METABOLIC PANEL
BUN: 16 mg/dL (ref 6–23)
CO2: 29 mEq/L (ref 19–32)
Calcium: 10 mg/dL (ref 8.4–10.5)
Chloride: 101 mEq/L (ref 96–112)
Creatinine, Ser: 0.82 mg/dL (ref 0.40–1.20)
GFR: 73.57 mL/min (ref >60.00)
Glucose, Bld: 98 mg/dL (ref 70–99)
Potassium: 4.4 mEq/L (ref 3.5–5.1)
Sodium: 139 mEq/L (ref 135–145)

## 2022-04-20 LAB — LIPID PANEL
Cholesterol: 144 mg/dL (ref 0–200)
HDL: 58 mg/dL (ref >39.00)
LDL Cholesterol: 56 mg/dL (ref 0–99)
NonHDL: 85.9
Total CHOL/HDL Ratio: 2
Triglycerides: 148 mg/dL (ref 0.0–149.0)
VLDL: 29.6 mg/dL (ref 0.0–40.0)

## 2022-04-20 LAB — HEMOGLOBIN A1C: Hgb A1c MFr Bld: 5.6 % (ref 4.6–6.5)

## 2022-04-23 ENCOUNTER — Ambulatory Visit: Payer: Medicare PPO | Admitting: Internal Medicine

## 2022-04-23 ENCOUNTER — Encounter: Payer: Self-pay | Admitting: Internal Medicine

## 2022-04-23 VITALS — BP 122/74 | HR 89 | Temp 98.0°F | Resp 16 | Ht 68.0 in | Wt 198.0 lb

## 2022-04-23 DIAGNOSIS — E7800 Pure hypercholesterolemia, unspecified: Secondary | ICD-10-CM

## 2022-04-23 DIAGNOSIS — F339 Major depressive disorder, recurrent, unspecified: Secondary | ICD-10-CM

## 2022-04-23 DIAGNOSIS — Z83719 Family history of colon polyps, unspecified: Secondary | ICD-10-CM

## 2022-04-23 DIAGNOSIS — R7989 Other specified abnormal findings of blood chemistry: Secondary | ICD-10-CM

## 2022-04-23 DIAGNOSIS — F419 Anxiety disorder, unspecified: Secondary | ICD-10-CM

## 2022-04-23 DIAGNOSIS — N6092 Unspecified benign mammary dysplasia of left breast: Secondary | ICD-10-CM

## 2022-04-23 DIAGNOSIS — Z85820 Personal history of malignant melanoma of skin: Secondary | ICD-10-CM

## 2022-04-23 DIAGNOSIS — I10 Essential (primary) hypertension: Secondary | ICD-10-CM | POA: Diagnosis not present

## 2022-04-23 DIAGNOSIS — R739 Hyperglycemia, unspecified: Secondary | ICD-10-CM

## 2022-04-23 DIAGNOSIS — F330 Major depressive disorder, recurrent, mild: Secondary | ICD-10-CM

## 2022-04-23 MED ORDER — FLUOXETINE HCL 40 MG PO CAPS: 40.0000 mg | ORAL_CAPSULE | Freq: Every day | ORAL | 1 refills | Status: DC

## 2022-04-23 MED ORDER — ENALAPRIL MALEATE 10 MG PO TABS: 10.0000 mg | ORAL_TABLET | Freq: Two times a day (BID) | ORAL | 3 refills | Status: DC

## 2022-04-23 MED ORDER — ROSUVASTATIN CALCIUM 10 MG PO TABS: 10.0000 mg | ORAL_TABLET | Freq: Every day | ORAL | 3 refills | Status: DC

## 2022-04-23 MED ORDER — BUPROPION HCL 75 MG PO TABS: 75.0000 mg | ORAL_TABLET | Freq: Every morning | ORAL | 1 refills | Status: DC

## 2022-04-23 NOTE — Progress Notes (Signed)
Subjective:    Patient ID: Jordan Benson, female    DOB: 1955-12-21, 67 y.o.   MRN: [id number]  Patient here for  Chief Complaint  Patient presents with   Medical Management of Chronic Issues    HPI Here to follow up regarding hypercholesterolemia and hypertension. 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  Followed by Dr Rogue Bussing.  Recommended to be on AI for total of 5 years until January 2026. Saw Dr Rogue Bussing 03/27/22.  Scheduled for reclast 04/27/22.  She is doing well.  Feels good.  Stays active.  No chest pain or sob reported.  No abdominal pain or bowel change reported.  No alcohol in 2 years. Due colonoscopy end of year.    Past Medical History:  Diagnosis Date   Anxiety    Breast cancer ([place]) 12/2017   hyperplasia only not cancer per pt   Complication of anesthesia    nausea   GERD (gastroesophageal reflux disease)    Headache    optical migraines   Hyperlipidemia    Hypertension    IBS (irritable bowel syndrome)    Melanoma ([place]) 2019   followed by Dr Evorn Gong, on left arm   PONV (postoperative nausea and vomiting)    Vitamin D deficiency    Past Surgical History:  Procedure Laterality Date   ABDOMINAL HYSTERECTOMY  1992   BREAST BIOPSY Left 12/21/2017   affirm bx of asymmetry, ATYPICAL DUCTAL PAPILLARY LESION WITH ADJACENT ATYPICAL DUCTAL HYPERPLASIA.    BREAST BIOPSY Left 12/21/2017   [place] bx of LN, benign   BREAST LUMPECTOMY Left 01/05/2018   ADH   BREAST LUMPECTOMY Left 01/12/2018   neg   BREAST LUMPECTOMY WITH NEEDLE LOCALIZATION Left 01/05/2018   Procedure: BREAST LUMPECTOMY WITH NEEDLE LOCALIZATION;  Surgeon: Olean Ree, MD;  Location: [facility] ORS;  Service: General;  Laterality: Left;   BREAST SURGERY     COLONOSCOPY  2001, 2016   x2 ARM and at [place] Right 10/2017   TKR   MOHS SURGERY  2012   OVARIAN CYST REMOVAL  1991   POLYPECTOMY     RE-EXCISION OF BREAST LUMPECTOMY Left  01/12/2018   Procedure: RE-EXCISION OF BREAST LUMPECTOMY;  Surgeon: Olean Ree, MD;  Location: [facility] OR[facility];  Service: General;  Laterality: Left;   SEPTOPLASTY  2009   TOTAL KNEE 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   Procedure: RIGHT TOTAL KNEE ARTHROPLASTY;  Surgeon: Sydnee Cabal, MD;  Location: [facility];  Service: Orthopedics;  Laterality: Right;  2 hrs   TUBAL LIGATION  1985   tummy tuck  1995   WISDOM TOOTH EXTRACTION     Family History  Problem Relation Age of Onset   Cancer Mother 61       breast cancer   Heart disease Mother    Hypertension Mother    Diabetes Mother    Breast cancer Mother 39   Heart disease Father    Depression Sister    Breast cancer Sister 38       on HRT   Colon polyps Sister    Alcohol abuse Maternal Grandfather    Alcohol abuse Paternal Grandfather    Alcohol abuse Paternal Grandmother    Anxiety disorder Daughter    Colon cancer Neg Hx    Esophageal cancer Neg Hx    Rectal cancer Neg Hx    Stomach cancer Neg Hx    Social History   Socioeconomic History   Marital status: Widowed  Spouse name: Iona Beard   Number of children: 2   Years of education: Not on file   Highest education level: Not on file  Occupational History    Employer: [employer]  Tobacco Use   Smoking status: Former    Packs/day: 0.50    Years: 10.00    Additional pack years: 0.00    Total pack years: 5.00    Types: Cigarettes    Quit date: 03/12/2000    Years since quitting: 22.1   Smokeless tobacco: Never  Vaping Use   Vaping Use: Never used  Substance and Sexual Activity   Alcohol use: Not Currently    Comment: wine daily- moderate use   Drug use: Never    Comment: gummies   Sexual activity: Not Currently  Other Topics Concern   Not on file  Social History Narrative   Not on file   Social Determinants of Health   Financial Resource Strain: Low Risk  (08/05/2021)   Overall Financial Resource Strain (CARDIA)    Difficulty of Paying Living Expenses: Not hard at all  Food  Insecurity: No Food Insecurity (10/09/2021)   Hunger Vital Sign    Worried About Running Out of Food in the Last Year: Never true    Ran Out of Food in the Last Year: Never true  Transportation Needs: No Transportation Needs (10/09/2021)   PRAPARE - Hydrologist (Medical): No    Lack of Transportation (Non-Medical): No  Physical Activity: Sufficiently Active (08/05/2021)   Exercise Vital Sign    Days of Exercise per Week: 3 days    Minutes of Exercise per Session: 60 min  Stress: No Stress Concern Present (08/05/2021)   [place]    Feeling of Stress : Not at all  Social Connections: Unknown (08/05/2021)   Social Connection and Isolation Panel [NHANES]    Frequency of Communication with Friends and Family: More than three times a week    Frequency of Social Gatherings with Friends and Family: More than three times a week    Attends Religious Services: Not on file    Active Member of Clubs or Organizations: Not on file    Attends Archivist Meetings: Not on file    Marital Status: Widowed     Review of Systems  Constitutional:  Negative for appetite change and unexpected weight change.  HENT:  Negative for congestion and sinus pressure.   Respiratory:  Negative for cough, chest tightness and shortness of breath.   Cardiovascular:  Negative for chest pain and palpitations.  Gastrointestinal:  Negative for abdominal pain, diarrhea, nausea and vomiting.  Genitourinary:  Negative for difficulty urinating and dysuria.  Musculoskeletal:  Negative for joint swelling and myalgias.  Skin:  Negative for color change and rash.  Neurological:  Negative for dizziness and headaches.  Psychiatric/Behavioral:  Negative for agitation and dysphoric mood.        Objective:     BP 122/74   Pulse 89   Temp 98 F (36.7 C)   Resp 16   Ht 5\' 8"  (1.727 m)   Wt 198 lb (89.8 kg)   SpO2 99%   BMI  30.11 kg/m  Wt Readings from Last 3 Encounters:  04/23/22 198 lb (89.8 kg)  02/19/22 198 lb 6.4 oz (90 kg)  10/23/21 200 lb 12.8 oz (91.1 kg)    Physical Exam Vitals reviewed.  Constitutional:      General: She is  not in acute distress.    Appearance: Normal appearance.  HENT:     Head: Normocephalic and atraumatic.     Right Ear: External ear normal.     Left Ear: External ear normal.  Eyes:     General: No scleral icterus.       Right eye: No discharge.        Left eye: No discharge.     Conjunctiva/sclera: Conjunctivae normal.  Neck:     Thyroid: No thyromegaly.  Cardiovascular:     Rate and Rhythm: Normal rate and regular rhythm.  Pulmonary:     Effort: No respiratory distress.     Breath sounds: Normal breath sounds. No wheezing.  Abdominal:     General: Bowel sounds are normal.     Palpations: Abdomen is soft.     Tenderness: There is no abdominal tenderness.  Musculoskeletal:        General: No swelling or tenderness.     Cervical back: Neck supple. No tenderness.  Lymphadenopathy:     Cervical: No cervical adenopathy.  Skin:    Findings: No erythema or rash.  Neurological:     Mental Status: She is alert.  Psychiatric:        Mood and Affect: Mood normal.        Behavior: Behavior normal.      Outpatient Encounter Medications as of 04/23/2022  Medication Sig   amLODipine (NORVASC) 10 MG tablet TAKE 1 TABLET EVERY DAY   anastrozole (ARIMIDEX) 1 MG tablet Take 1 tablet (1 mg total) by mouth daily.   buPROPion (WELLBUTRIN) 75 MG tablet Take 1 tablet (75 mg total) by mouth in the morning.   CALCIUM-VITAMIN D PO Take by mouth.   enalapril (VASOTEC) 10 MG tablet Take 1 tablet (10 mg total) by mouth 2 (two) times daily.   FLUoxetine (PROZAC) 40 MG capsule Take 1 capsule (40 mg total) by mouth daily.   Multiple Vitamins-Calcium (ONE-A-DAY WOMENS PO) Take 1 tablet by mouth daily.   rosuvastatin (CRESTOR) 10 MG tablet Take 1 tablet (10 mg total) by mouth daily.    [DISCONTINUED] amoxicillin-clavulanate (AUGMENTIN) 875-125 MG tablet Take 1 tablet by mouth 2 (two) times daily.   [DISCONTINUED] buPROPion (WELLBUTRIN) 75 MG tablet Take 1 tablet (75 mg total) by mouth in the morning.   [DISCONTINUED] enalapril (VASOTEC) 10 MG tablet TAKE 1 TABLET TWICE DAILY   [DISCONTINUED] FLUoxetine (PROZAC) 40 MG capsule TAKE 1 CAPSULE EVERY DAY   [DISCONTINUED] rosuvastatin (CRESTOR) 10 MG tablet TAKE 1 TABLET EVERY DAY   No facility-administered encounter medications on file as of 04/23/2022.     Lab Results  Component Value Date   WBC 6.7 04/20/2022   HGB 14.1 04/20/2022   HCT 41.0 04/20/2022   PLT 324.0 04/20/2022   GLUCOSE 98 04/20/2022   CHOL 144 04/20/2022   TRIG 148.0 04/20/2022   HDL 58.00 04/20/2022   LDLDIRECT 138.0 03/20/2019   LDLCALC 56 04/20/2022   ALT 13 04/20/2022   AST 16 04/20/2022   NA 139 04/20/2022   K 4.4 04/20/2022   CL 101 04/20/2022   CREATININE 0.82 04/20/2022   BUN 16 04/20/2022   CO2 29 04/20/2022   TSH 1.55 04/20/2022   INR 0.94 08/13/2017   HGBA1C 5.6 04/20/2022    MM 3D SCREEN BREAST BILATERAL  Result Date: 03/13/2022 CLINICAL DATA:  Screening. EXAM: DIGITAL SCREENING BILATERAL MAMMOGRAM WITH TOMOSYNTHESIS AND CAD TECHNIQUE: Bilateral screening digital craniocaudal and mediolateral oblique mammograms were obtained. Bilateral screening  digital breast tomosynthesis was performed. The images were evaluated with computer-aided detection. COMPARISON:  Previous exam(s). ACR Breast Density Category b: There are scattered areas of fibroglandular density. FINDINGS: There are no findings suspicious for malignancy. IMPRESSION: No mammographic evidence of malignancy. A result letter of this screening mammogram will be mailed directly to the patient. RECOMMENDATION: Screening mammogram in one year. (Code:SM-B-01Y) BI-RADS CATEGORY  1: Negative. Electronically Signed   By: Ammie Ferrier M.D.   On: 03/13/2022 08:28   DG Bone  Density  Result Date: 03/11/2022 EXAM: DUAL X-RAY ABSORPTIOMETRY (DXA) FOR BONE MINERAL DENSITY IMPRESSION: Dear Dr Rogue Bussing, Your patient MIAMOR PIGOTT completed a FRAX assessment on 03/11/2022 using the [place] (analysis version: 14.10) manufactured by [employer]. The following summarizes the results of our evaluation. PATIENT BIOGRAPHICAL: Name: Jaqlyn, Mansaray Patient ID: [id number] Birth Date: 1955-08-14 Height:    68.0 in. Gender:     Female    [age]       Weight:    198.4 lbs. Ethnicity:  White                            Exam Date: 03/11/2022 FRAX* RESULTS:  (version: 3.5) 10-year Probability of Fracture1 Major Osteoporotic Fracture2 Hip Fracture 19.4% 3.7% Population: [place] (Caucasian) Risk Factors: History of Fracture (Adult) Based on Femur (Left) Neck BMD 1 -The 10-year probability of fracture may be lower than reported if the patient has received treatment. 2 -Major Osteoporotic Fracture: Clinical Spine, Forearm, Hip or Shoulder *FRAX is a Materials engineer of the [employer] for Metabolic Bone Disease, a [place] (WHO) [facility]. ASSESSMENT: The probability of a major osteoporotic fracture is 19.4% within the next ten years. The probability of a hip fracture is 3.7% within the next ten years. Your patient Nayonna Sideris completed a BMD test on 03/11/2022 using the [employer] Vista (software version: 14.10) manufactured by [employer]. The following summarizes the results of our evaluation. Technologist PATIENT BIOGRAPHICAL: Name: Annazette, Gambino Patient ID: [id number] Birth Date: 13-Jun-1955 Height: 68.0 in. Gender: Female Exam Date: 03/11/2022 Weight: 198.4 lbs. Indications: Oophorectomy Bilateral, Postmenopausal, History of Fracture (Adult), Vitamin D Deficiency, Osteoarthritis, Caucasian, Hysterectomy Fractures: Foot Treatments: Anastrozole, Calcium, Multi-Vitamin, Vitamin D DENSITOMETRY  RESULTS: Site         Region     Measured Date Measured Age WHO Classification Young Adult T-score BMD         %Change vs. Previous Significant Change (*) AP Spine L1-L2 03/11/2020 68.1 Osteopenia -1.7 0.967 g/cm2 -3.4% - AP Spine L1-L2 04/20/2018 64.2 Osteopenia -1.4 1.001 g/cm2 - - DualFemur Neck Left 03/11/2022 68.1 Osteopenia -2.2 0.733 g/cm2 -6.9% Yes DualFemur Neck Left 04/20/2018 64.2 Osteopenia -1.8 0.787 g/cm2 - - DualFemur Total Mean 03/11/2022 68.1 Osteopenia -1.7 0.799 g/cm2 -4.3% Yes DualFemur Total Mean 04/20/2018 64.2 Osteopenia -1.4 0.835 g/cm2 - - Left Forearm Radius 33% 03/11/2022 68.1 Normal -0.5 0.833 g/cm2 -7.0% Yes Left Forearm Radius 33% 04/20/2018 64.2 Normal 0.2 0.896 g/cm2 - - ASSESSMENT: The BMD measured at Femur Neck Left is 0.733 g/cm2 with a T-score of -2.2. This patient is considered osteopenic according to [facility]) criteria. The scan quality is good. L-3 and L-4 were excluded due to degenerative changes. Compared with prior study, there has been no significant change in the spine. Compared with prior study, there has been significant  decrease in the total hip. World Pharmacologist [facility]) criteria for post-menopausal, Caucasian Women: Normal:                   T-score at or above -1 SD Osteopenia/low bone mass: T-score between -1 and -2.5 SD Osteoporosis:             T-score at or below -2.5 SD RECOMMENDATIONS: 1. All patients should optimize calcium and vitamin D intake. 2. Consider FDA-approved medical therapies in postmenopausal women and men aged 66 years and older, based on the following: a. A hip or vertebral(clinical or morphometric) fracture b. T-score < -2.5 at the femoral neck or spine after appropriate evaluation to exclude secondary causes c. Low bone mass (T-score between -1.0 and -2.5 at the femoral neck or spine) and a 10-year probability of a hip fracture > 3% or a 10-year probability of a major osteoporosis-related fracture > 20% based on the  [place]-adapted WHO algorithm 3. Clinician judgment and/or patient preferences may indicate treatment for people with 10-year fracture probabilities above or below these levels FOLLOW-UP: People with diagnosed cases of osteoporosis or at high risk for fracture should have regular bone mineral density tests. For patients eligible for Medicare, routine testing is allowed once every 2 years. The testing frequency can be increased to one year for patients who have rapidly progressing disease, those who are receiving or discontinuing medical therapy to restore bone mass, or have additional risk factors. I have reviewed this report, and agree with the above findings. Mark A. Thornton Papas, M.D. [facility] Electronically Signed   By: Lavonia Dana M.D.   On: 03/11/2022 15:08       Assessment & Plan:  MDD (major depressive disorder), recurrent episode, with atypical features ([place])  Hyperbilirubinemia -     Hepatic function panel; Future  Hypercholesterolemia Assessment & Plan: On crestor.  Low cholesterol diet and exercise.  Follow lipid panel and liver function tests.   Lab Results  Component Value Date   CHOL 144 04/20/2022   HDL 58.00 04/20/2022   LDLCALC 56 04/20/2022   LDLDIRECT 138.0 03/20/2019   TRIG 148.0 04/20/2022   CHOLHDL 2 04/20/2022    Orders: -     Lipid panel; Future -     Hepatic function panel; Future  Hyperglycemia Assessment & Plan: Low carb diet and exercise.  Follow met b and a1c.   Lab Results  Component Value Date   HGBA1C 5.6 04/20/2022    Orders: -     Hemoglobin A1c; Future  Essential hypertension, benign Assessment & Plan: Blood pressure doing well on enalapril, triam/hctz and amlodipine.  Taking enalapril q day. Continue current medication regimen.  Follow pressures.  Follow metabolic panel.   Orders: -     Basic metabolic panel; Future  Abnormal liver function test Assessment & Plan: Bilirubin 1.8.  remainder of liver panel wnl.  Has varied in the  past.  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  Has stopped drinking.  No alcohol in 2 years.  Follow.     Anxiety Assessment & Plan: Continues on prozac.  Doing well.     Atypical ductal hyperplasia of left breast Assessment & Plan: Left breast atypical ductal hyperplasia [January 2021-Arimidex ]currently on antihormone therapy with armidex 1mg /day. MARCH 2024- Mammo-WNL.   Tolerating well without any major side effects. No mammographic evidence of malignancy.  Discussed that she would need to be on AI for total of 5 years until January 2026.    Family history of  colonic polyps Assessment & Plan: Colonoscopy 12/04/19 - several polyps present and internal hemorrhoids.  Followed by Dr Lucio Edward.    History of melanoma Assessment & Plan: Followed by dermatology.     MDD (major depressive disorder), recurrent episode, mild ([place]) Assessment & Plan: Overall doing well.  Has been followed by psychiatry.  This has gone well.  Prozac and wellbutrin - working well.  Follow.    Other orders -     buPROPion HCl; Take 1 tablet (75 mg total) by mouth in the morning.  Dispense: 90 tablet; Refill: 1 -     Enalapril Maleate; Take 1 tablet (10 mg total) by mouth 2 (two) times daily.  Dispense: 180 tablet; Refill: 3 -     FLUoxetine HCl; Take 1 capsule (40 mg total) by mouth daily.  Dispense: 90 capsule; Refill: 1 -     Rosuvastatin Calcium; Take 1 tablet (10 mg total) by mouth daily.  Dispense: 90 tablet; Refill: 3     Einar Pheasant, MD

## 2022-04-27 ENCOUNTER — Ambulatory Visit: Payer: Medicare PPO

## 2022-05-01 ENCOUNTER — Ambulatory Visit: Payer: Medicare PPO

## 2022-05-03 ENCOUNTER — Encounter: Payer: Self-pay | Admitting: Internal Medicine

## 2022-05-03 NOTE — Assessment & Plan Note (Signed)
On crestor.  Low cholesterol diet and exercise.  Follow lipid panel and liver function tests.   Lab Results  Component Value Date   CHOL 144 04/20/2022   HDL 58.00 04/20/2022   LDLCALC 56 04/20/2022   LDLDIRECT 138.0 03/20/2019   TRIG 148.0 04/20/2022   CHOLHDL 2 04/20/2022

## 2022-05-03 NOTE — Assessment & Plan Note (Signed)
Overall doing well.  Has been followed by psychiatry.  This has gone well.  Prozac and wellbutrin - working well.  Follow.  

## 2022-05-03 NOTE — Assessment & Plan Note (Signed)
Followed by dermatology

## 2022-05-03 NOTE — Assessment & Plan Note (Signed)
Bilirubin 1.8.  remainder of liver panel wnl.  Has varied in the past.  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  Has stopped drinking.  No alcohol in 2 years.  Follow.

## 2022-05-03 NOTE — Assessment & Plan Note (Signed)
Continues on prozac.  Doing well.   ?

## 2022-05-03 NOTE — Assessment & Plan Note (Signed)
Colonoscopy 12/04/19 - several polyps present and internal hemorrhoids.  Followed by Dr Malcolm Stark.  

## 2022-05-03 NOTE — Assessment & Plan Note (Signed)
Blood pressure doing well on enalapril, triam/hctz and amlodipine.  Taking enalapril q day. Continue current medication regimen.  Follow pressures.  Follow metabolic panel.  

## 2022-05-03 NOTE — Assessment & Plan Note (Signed)
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 MARCH 2024- Mammo-WNL.   Tolerating well without any major side effects. No mammographic evidence of malignancy.  Discussed that she would need to be on AI for total of 5 years until January 2026.

## 2022-05-03 NOTE — Assessment & Plan Note (Signed)
Low carb diet and exercise.  Follow met b and a1c.   Lab Results  Component Value Date   HGBA1C 5.6 04/20/2022

## 2022-05-04 DIAGNOSIS — F411 Generalized anxiety disorder: Secondary | ICD-10-CM | POA: Diagnosis not present

## 2022-05-06 ENCOUNTER — Other Ambulatory Visit: Payer: Medicare PPO

## 2022-05-06 ENCOUNTER — Ambulatory Visit: Payer: Medicare PPO | Admitting: Internal Medicine

## 2022-05-06 ENCOUNTER — Ambulatory Visit: Payer: Medicare PPO

## 2022-05-07 ENCOUNTER — Encounter: Payer: Self-pay | Admitting: Internal Medicine

## 2022-05-07 DIAGNOSIS — H9193 Unspecified hearing loss, bilateral: Secondary | ICD-10-CM

## 2022-05-07 NOTE — Telephone Encounter (Signed)
Ok to place ENT referral for hearing evaluation.

## 2022-05-11 ENCOUNTER — Other Ambulatory Visit: Payer: Medicare PPO

## 2022-05-11 ENCOUNTER — Ambulatory Visit: Payer: Medicare PPO

## 2022-05-11 ENCOUNTER — Ambulatory Visit: Payer: Medicare PPO | Admitting: Internal Medicine

## 2022-05-25 DIAGNOSIS — F411 Generalized anxiety disorder: Secondary | ICD-10-CM | POA: Diagnosis not present

## 2022-05-27 ENCOUNTER — Other Ambulatory Visit: Payer: Self-pay | Admitting: Internal Medicine

## 2022-05-28 ENCOUNTER — Encounter: Payer: Self-pay | Admitting: Internal Medicine

## 2022-06-08 DIAGNOSIS — H903 Sensorineural hearing loss, bilateral: Secondary | ICD-10-CM | POA: Diagnosis not present

## 2022-06-10 DIAGNOSIS — F411 Generalized anxiety disorder: Secondary | ICD-10-CM | POA: Diagnosis not present

## 2022-06-12 DIAGNOSIS — Z96652 Presence of left artificial knee joint: Secondary | ICD-10-CM | POA: Diagnosis not present

## 2022-06-12 DIAGNOSIS — M25561 Pain in right knee: Secondary | ICD-10-CM | POA: Diagnosis not present

## 2022-06-27 DIAGNOSIS — Z6829 Body mass index (BMI) 29.0-29.9, adult: Secondary | ICD-10-CM | POA: Diagnosis not present

## 2022-06-27 DIAGNOSIS — R350 Frequency of micturition: Secondary | ICD-10-CM | POA: Diagnosis not present

## 2022-07-13 DIAGNOSIS — F411 Generalized anxiety disorder: Secondary | ICD-10-CM | POA: Diagnosis not present

## 2022-07-14 ENCOUNTER — Encounter: Payer: Self-pay | Admitting: Internal Medicine

## 2022-07-14 NOTE — Telephone Encounter (Signed)
Her fasting labs were in March. Ok to order liver function and met b?

## 2022-07-14 NOTE — Telephone Encounter (Signed)
There are labs ordered.  She had her last 04/20/22.  07/21/22 - would be 3 months. Does she want to get all labs ordered at that lab appt

## 2022-07-15 NOTE — Telephone Encounter (Signed)
Pt scheduled for fasting lab

## 2022-07-22 ENCOUNTER — Other Ambulatory Visit (INDEPENDENT_AMBULATORY_CARE_PROVIDER_SITE_OTHER): Payer: Medicare PPO

## 2022-07-22 DIAGNOSIS — R739 Hyperglycemia, unspecified: Secondary | ICD-10-CM | POA: Diagnosis not present

## 2022-07-22 DIAGNOSIS — I10 Essential (primary) hypertension: Secondary | ICD-10-CM

## 2022-07-22 DIAGNOSIS — E7800 Pure hypercholesterolemia, unspecified: Secondary | ICD-10-CM | POA: Diagnosis not present

## 2022-07-22 LAB — HEPATIC FUNCTION PANEL
ALT: 14 U/L (ref 0–35)
AST: 18 U/L (ref 0–37)
Albumin: 4.2 g/dL (ref 3.5–5.2)
Alkaline Phosphatase: 57 U/L (ref 39–117)
Bilirubin, Direct: 0.2 mg/dL (ref 0.0–0.3)
Total Bilirubin: 1.2 mg/dL (ref 0.2–1.2)
Total Protein: 7.1 g/dL (ref 6.0–8.3)

## 2022-07-22 LAB — BASIC METABOLIC PANEL
BUN: 15 mg/dL (ref 6–23)
CO2: 28 mEq/L (ref 19–32)
Calcium: 9.1 mg/dL (ref 8.4–10.5)
Chloride: 101 mEq/L (ref 96–112)
Creatinine, Ser: 0.79 mg/dL (ref 0.40–1.20)
GFR: 76.79 mL/min (ref >60.00)
Glucose, Bld: 100 mg/dL — ABNORMAL HIGH (ref 70–99)
Potassium: 3.7 mEq/L (ref 3.5–5.1)
Sodium: 138 mEq/L (ref 135–145)

## 2022-07-22 LAB — LIPID PANEL
Cholesterol: 129 mg/dL (ref 0–200)
HDL: 53.1 mg/dL (ref >39.00)
LDL Cholesterol: 44 mg/dL (ref 0–99)
NonHDL: 75.97
Total CHOL/HDL Ratio: 2
Triglycerides: 162 mg/dL — ABNORMAL HIGH (ref 0.0–149.0)
VLDL: 32.4 mg/dL (ref 0.0–40.0)

## 2022-07-22 LAB — HEMOGLOBIN A1C: Hgb A1c MFr Bld: 5.4 % (ref 4.6–6.5)

## 2022-07-22 NOTE — Addendum Note (Signed)
Addended by: Jarvis Morgan D on: 07/22/2022 10:16 AM   Modules accepted: Orders

## 2022-07-27 DIAGNOSIS — F411 Generalized anxiety disorder: Secondary | ICD-10-CM | POA: Diagnosis not present

## 2022-08-07 ENCOUNTER — Encounter: Payer: Self-pay | Admitting: Internal Medicine

## 2022-08-10 ENCOUNTER — Other Ambulatory Visit: Payer: Self-pay | Admitting: Family

## 2022-08-10 ENCOUNTER — Telehealth: Payer: Self-pay | Admitting: Internal Medicine

## 2022-08-10 NOTE — Telephone Encounter (Signed)
Copied from CRM [id number]. Topic: Medicare AWV >> Aug 10, 2022 10:52 AM Payton Doughty wrote: Reason for CRM: LM 08/10/2022 to change AWV visit to telephone visit.  Please confirm appt changes   Verlee Rossetti; Care Guide Ambulatory Clinical Support [place] [facility] Direct Dial: [phone]

## 2022-08-11 NOTE — Telephone Encounter (Signed)
Called and spoke to Jordan Benson.  Questions answered.

## 2022-08-14 ENCOUNTER — Encounter: Payer: Self-pay | Admitting: Internal Medicine

## 2022-08-17 ENCOUNTER — Other Ambulatory Visit: Payer: Self-pay

## 2022-08-17 MED ORDER — TRIAMTERENE-HCTZ 37.5-25 MG PO TABS: 1.0000 | ORAL_TABLET | Freq: Every day | ORAL | 1 refills | Status: DC

## 2022-08-17 NOTE — Telephone Encounter (Signed)
I am not sure why the triam/hydrochlorothiazide was taken off of her med list. Pt has been taking regularly and was last filled by mail order 05/27/2022. Ok to send in new rx?

## 2022-08-17 NOTE — Telephone Encounter (Signed)
Ok to refill triam/hydrochlorothiazide.  Recent labs ok and she has been taking regularly.

## 2022-09-10 DIAGNOSIS — M79672 Pain in left foot: Secondary | ICD-10-CM | POA: Diagnosis not present

## 2022-09-10 DIAGNOSIS — S92512A Displaced fracture of proximal phalanx of left lesser toe(s), initial encounter for closed fracture: Secondary | ICD-10-CM | POA: Diagnosis not present

## 2022-09-10 DIAGNOSIS — M7989 Other specified soft tissue disorders: Secondary | ICD-10-CM | POA: Diagnosis not present

## 2022-09-25 ENCOUNTER — Encounter: Payer: Self-pay | Admitting: Internal Medicine

## 2022-09-25 DIAGNOSIS — M7989 Other specified soft tissue disorders: Secondary | ICD-10-CM | POA: Diagnosis not present

## 2022-09-25 DIAGNOSIS — S92911D Unspecified fracture of right toe(s), subsequent encounter for fracture with routine healing: Secondary | ICD-10-CM | POA: Diagnosis not present

## 2022-09-25 DIAGNOSIS — S92503A Displaced unspecified fracture of unspecified lesser toe(s), initial encounter for closed fracture: Secondary | ICD-10-CM | POA: Insufficient documentation

## 2022-09-25 DIAGNOSIS — M79672 Pain in left foot: Secondary | ICD-10-CM | POA: Diagnosis not present

## 2022-10-15 DIAGNOSIS — R82998 Other abnormal findings in urine: Secondary | ICD-10-CM | POA: Diagnosis not present

## 2022-10-15 DIAGNOSIS — R300 Dysuria: Secondary | ICD-10-CM | POA: Diagnosis not present

## 2022-10-15 DIAGNOSIS — R350 Frequency of micturition: Secondary | ICD-10-CM | POA: Diagnosis not present

## 2022-10-23 ENCOUNTER — Ambulatory Visit: Payer: Medicare PPO | Admitting: Nurse Practitioner

## 2022-10-23 VITALS — BP 122/78 | HR 84 | Temp 98.2°F | Ht 68.0 in | Wt 202.2 lb

## 2022-10-23 DIAGNOSIS — N3946 Mixed incontinence: Secondary | ICD-10-CM | POA: Insufficient documentation

## 2022-10-23 DIAGNOSIS — R350 Frequency of micturition: Secondary | ICD-10-CM

## 2022-10-23 LAB — POC URINALSYSI DIPSTICK (AUTOMATED)
Bilirubin, UA: NEGATIVE
Blood, UA: NEGATIVE
Glucose, UA: NEGATIVE
Ketones, UA: NEGATIVE
Leukocytes, UA: NEGATIVE
Nitrite, UA: NEGATIVE
Protein, UA: NEGATIVE
Spec Grav, UA: 1.015 (ref 1.010–1.025)
Urobilinogen, UA: 0.2 U/dL
pH, UA: 7 (ref 5.0–8.0)

## 2022-10-23 MED ORDER — OXYBUTYNIN CHLORIDE ER 10 MG PO TB24
10.0000 mg | ORAL_TABLET | Freq: Every day | ORAL | 3 refills | Status: DC
Start: 2022-10-23 — End: 2022-12-22

## 2022-10-23 NOTE — Progress Notes (Unsigned)
Bethanie Dicker, NP-C Phone: [phone]  Jordan Benson is a 67 y.o. female who presents today for incontinence.  Patient has a history of mixed urinary incontinence- urge and stress. She notes worsening symptoms recently. She is having increased frequency and has been finding it difficult to make it to the restroom. She is concerned that she has a bladder prolapse. She has had a hysterectomy. She does not feel a bulge. She does have an appointment scheduled to see UroGyn in November.   Social History   Tobacco Use  Smoking Status Former   Current packs/day: 0.00   Average packs/day: 0.5 packs/day for 10.0 years (5.0 ttl pk-yrs)   Types: Cigarettes   Start date: 03/12/1990   Quit date: 03/12/2000   Years since quitting: 22.6  Smokeless Tobacco Never    Current Outpatient Medications on File Prior to Visit  Medication Sig Dispense Refill   amLODipine (NORVASC) 10 MG tablet TAKE 1 TABLET EVERY DAY 90 tablet 3   anastrozole (ARIMIDEX) 1 MG tablet TAKE 1 TABLET EVERY DAY 90 tablet 3   buPROPion (WELLBUTRIN) 75 MG tablet Take 1 tablet (75 mg total) by mouth in the morning. 90 tablet 1   CALCIUM-VITAMIN D PO Take by mouth.     enalapril (VASOTEC) 10 MG tablet Take 1 tablet (10 mg total) by mouth 2 (two) times daily. 180 tablet 3   FLUoxetine (PROZAC) 40 MG capsule Take 1 capsule (40 mg total) by mouth daily. 90 capsule 1   Multiple Vitamins-Calcium (ONE-A-DAY WOMENS PO) Take 1 tablet by mouth daily.     rosuvastatin (CRESTOR) 10 MG tablet Take 1 tablet (10 mg total) by mouth daily. 90 tablet 3   triamterene-hydrochlorothiazide (MAXZIDE-25) 37.5-25 MG tablet Take 1 tablet by mouth daily. 90 tablet 1   No current facility-administered medications on file prior to visit.    ROS see history of present illness  Objective  Physical Exam Vitals:   10/23/22 0811  BP: 122/78  Pulse: 84  Temp: 98.2 F (36.8 C)  SpO2: 99%    BP Readings from Last 3 Encounters:  10/23/22 122/78   04/23/22 122/74  02/19/22 116/70   Wt Readings from Last 3 Encounters:  10/23/22 202 lb 3.2 oz (91.7 kg)  04/23/22 198 lb (89.8 kg)  02/19/22 198 lb 6.4 oz (90 kg)    Physical Exam Exam conducted with a chaperone present Dorris Fetch, CMA).  Constitutional:      General: She is not in acute distress.    Appearance: Normal appearance.  HENT:     Head: Normocephalic.  Cardiovascular:     Rate and Rhythm: Normal rate and regular rhythm.     Heart sounds: Normal heart sounds.  Pulmonary:     Effort: Pulmonary effort is normal.     Breath sounds: Normal breath sounds.  Genitourinary:    General: Normal vulva.     Uterus: Absent.   Skin:    General: Skin is warm and dry.  Neurological:     General: No focal deficit present.     Mental Status: She is alert.  Psychiatric:        Mood and Affect: Mood normal.        Behavior: Behavior normal.     Assessment/Plan: Please see individual problem list.  Urinary frequency -     POCT Urinalysis Dipstick (Automated)  Mixed incontinence urge and stress    No follow-ups on file.   Bethanie Dicker, NP-C [facility] [employer]

## 2022-11-02 DIAGNOSIS — F411 Generalized anxiety disorder: Secondary | ICD-10-CM | POA: Diagnosis not present

## 2022-11-03 DIAGNOSIS — D2262 Melanocytic nevi of left upper limb, including shoulder: Secondary | ICD-10-CM | POA: Diagnosis not present

## 2022-11-03 DIAGNOSIS — D485 Neoplasm of uncertain behavior of skin: Secondary | ICD-10-CM | POA: Diagnosis not present

## 2022-11-03 DIAGNOSIS — Z85820 Personal history of malignant melanoma of skin: Secondary | ICD-10-CM | POA: Diagnosis not present

## 2022-11-03 DIAGNOSIS — D2261 Melanocytic nevi of right upper limb, including shoulder: Secondary | ICD-10-CM | POA: Diagnosis not present

## 2022-11-03 DIAGNOSIS — D2272 Melanocytic nevi of left lower limb, including hip: Secondary | ICD-10-CM | POA: Diagnosis not present

## 2022-11-03 DIAGNOSIS — C44319 Basal cell carcinoma of skin of other parts of face: Secondary | ICD-10-CM | POA: Diagnosis not present

## 2022-11-03 DIAGNOSIS — Z85828 Personal history of other malignant neoplasm of skin: Secondary | ICD-10-CM | POA: Diagnosis not present

## 2022-11-03 DIAGNOSIS — D225 Melanocytic nevi of trunk: Secondary | ICD-10-CM | POA: Diagnosis not present

## 2022-11-03 NOTE — Assessment & Plan Note (Signed)
Concern for bladder prolapse contributing to increased symptoms. Will trial patient on Oxybutynin ER 10 mg at bedtime. Counseled on common side effects. She will follow up with UroGyn in November as scheduled. Return precautions given to patient.

## 2022-11-03 NOTE — Assessment & Plan Note (Addendum)
UA in office negative. Likely due to incontinence.

## 2022-11-10 ENCOUNTER — Ambulatory Visit (INDEPENDENT_AMBULATORY_CARE_PROVIDER_SITE_OTHER): Payer: Medicare PPO | Admitting: *Deleted

## 2022-11-10 ENCOUNTER — Encounter: Payer: Self-pay | Admitting: Internal Medicine

## 2022-11-10 VITALS — BP 118/76 | Temp 97.1°F | Ht 68.0 in | Wt 203.4 lb

## 2022-11-10 DIAGNOSIS — Z23 Encounter for immunization: Secondary | ICD-10-CM | POA: Diagnosis not present

## 2022-11-10 DIAGNOSIS — Z1211 Encounter for screening for malignant neoplasm of colon: Secondary | ICD-10-CM

## 2022-11-10 DIAGNOSIS — Z0000 Encounter for general adult medical examination without abnormal findings: Secondary | ICD-10-CM | POA: Diagnosis not present

## 2022-11-10 NOTE — Progress Notes (Signed)
Subjective:   Jordan Benson is a 67 y.o. female who presents for Medicare Annual (Subsequent) preventive examination.  Visit Complete: In person   Cardiac Risk Factors include: advanced age (>58men, >63 women);dyslipidemia;hypertension     Objective:    Today's Vitals   11/10/22 1114  BP: 118/76  Temp: (!) 97.1 F (36.2 C)  TempSrc: Skin  Weight: 203 lb 6 oz (92.3 kg)  Height: 5\' 8"  (1.727 m)   Body mass index is 30.92 kg/m.     11/10/2022   11:30 AM 03/27/2022    2:41 PM 09/18/2021    4:42 PM 08/05/2021    9:11 AM 03/27/2021   10:37 AM 03/25/2020   12:45 PM 02/26/2020    8:36 AM  Advanced Directives  Does Patient Have a Medical Advance Directive? Yes Yes No Yes Yes Yes Yes  Type of Estate agent of [place];Living will Living will;Healthcare Power of [employer] Power of [place];Living will Healthcare Power of [place];Living will Healthcare Power of [place];Living will Healthcare Power of [place];Living will  Does patient want to make changes to medical advance directive?    No - Patient declined  No - Patient declined No - Patient declined  Copy of Healthcare Power of Attorney in Chart? No - copy requested   No - copy requested  No - copy requested No - copy requested    Current Medications (verified) Outpatient Encounter Medications as of 11/10/2022  Medication Sig   amLODipine (NORVASC) 10 MG tablet TAKE 1 TABLET EVERY DAY   anastrozole (ARIMIDEX) 1 MG tablet TAKE 1 TABLET EVERY DAY   buPROPion (WELLBUTRIN) 75 MG tablet Take 1 tablet (75 mg total) by mouth in the morning.   CALCIUM-VITAMIN D PO Take by mouth.   enalapril (VASOTEC) 10 MG tablet Take 1 tablet (10 mg total) by mouth 2 (two) times daily.   FLUoxetine (PROZAC) 40 MG capsule Take 1 capsule (40 mg total) by mouth daily.   Multiple Vitamins-Calcium (ONE-A-DAY WOMENS PO) Take 1 tablet by mouth daily.   oxybutynin (DITROPAN-XL) 10 MG 24 hr tablet Take 1 tablet (10 mg total)  by mouth at bedtime.   rosuvastatin (CRESTOR) 10 MG tablet Take 1 tablet (10 mg total) by mouth daily.   triamterene-hydrochlorothiazide (MAXZIDE-25) 37.5-25 MG tablet Take 1 tablet by mouth daily.   No facility-administered encounter medications on file as of 11/10/2022.    Allergies (verified) Patient has no known allergies.   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    BREAST BIOPSY Left 12/21/2017   [place] bx of LN, benign   BREAST LUMPECTOMY Left 01/05/2018   ADH   BREAST LUMPECTOMY Left 01/12/2018   neg   BREAST LUMPECTOMY WITH NEEDLE LOCALIZATION Left 01/05/2018   Procedure: BREAST LUMPECTOMY WITH NEEDLE LOCALIZATION;  Surgeon: Henrene Dodge, MD;  Location: [facility] ORS;  Service: General;  Laterality: Left;   BREAST SURGERY     COLONOSCOPY  2001, 2016   x2 ARM [facility]   JOINT REPLACEMENT Right 10/2017   TKR   MOHS SURGERY  2012   OVARIAN CYST REMOVAL  1991   POLYPECTOMY     RE-EXCISION OF BREAST LUMPECTOMY Left 01/12/2018   Procedure: RE-EXCISION OF BREAST LUMPECTOMY;  Surgeon: Henrene Dodge, MD;  Location: [facility] ORS;  Service: General;  Laterality: Left;   SEPTOPLASTY  2009   TOTAL KNEE 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   Procedure: RIGHT TOTAL KNEE ARTHROPLASTY;  Surgeon: Eugenia Mcalpine, MD;  Location: [facility] OR[facility];  Service: Orthopedics;  Laterality: Right;  2 hrs    TUBAL LIGATION  1985   tummy tuck  1995   WISDOM TOOTH EXTRACTION     Family History  Problem Relation Age of Onset   Cancer Mother 62       breast cancer   Heart disease Mother    Hypertension Mother    Diabetes Mother    Breast cancer Mother 58   Heart disease Father    Depression Sister    Breast cancer Sister 17       on HRT   Colon polyps Sister    Alcohol abuse Maternal Grandfather    Alcohol abuse Paternal Grandfather    Alcohol abuse Paternal Grandmother    Anxiety disorder Daughter    Colon cancer Neg Hx    Esophageal cancer Neg Hx    Rectal cancer Neg Hx    Stomach cancer Neg Hx    Social History   Socioeconomic History   Marital status: Widowed    Spouse name: Jordan Benson   Number of children: 2   Years of education: Not on file   Highest education level: Not on file  Occupational History    Employer: [employer]  Tobacco Use   Smoking status: Former    Current packs/day: 0.00    Average packs/day: 0.5 packs/day for 10.0 years (5.0 ttl pk-yrs)    Types: Cigarettes    Start date: 03/12/1990    Quit date: 03/12/2000    Years since quitting: 22.6   Smokeless tobacco: Never  Vaping Use   Vaping status: Never Used  Substance and Sexual Activity   Alcohol use: Not Currently    Comment: wine daily- moderate use   Drug use: Never    Comment: gummies   Sexual activity: Not Currently  Other Topics Concern   Not on file  Social History Narrative   Widow   Retired from [employer]   Social Determinants of Health   Financial Resource Strain: Low Risk  (11/10/2022)   Overall Financial Resource Strain (CARDIA)    Difficulty of Paying Living Expenses: Not hard at all  Food Insecurity: No Food Insecurity (11/10/2022)   Hunger Vital Sign    Worried About Running Out of Food in the Last Year: Never true    Ran Out of Food in the Last Year: Never true  Transportation Needs: No Transportation Needs (11/10/2022)   PRAPARE - Administrator, Civil Service (Medical):  No    Lack of Transportation (Non-Medical): No  Physical Activity: Insufficiently Active (11/10/2022)   Exercise Vital Sign    Days of Exercise per Week: 2 days    Minutes of Exercise per Session: 40 min  Stress: Stress Concern Present (11/10/2022)   [employer] of Occupational Health - Occupational Stress  Questionnaire    Feeling of Stress : To some extent  Social Connections: Socially Isolated (11/10/2022)   Social Connection and Isolation Panel [NHANES]    Frequency of Communication with Friends and Family: More than three times a week    Frequency of Social Gatherings with Friends and Family: More than three times a week    Attends Religious Services: Never    Database administrator or Organizations: No    Attends Banker Meetings: Never    Marital Status: Widowed    Tobacco Counseling Counseling given: Not Answered   Clinical Intake:  Pre-visit preparation completed: Yes  Pain : No/denies pain     BMI - recorded: 30.92 Nutritional Status: BMI > 30  Obese Nutritional Risks: None Diabetes: No  How often do you need to have someone help you when you read instructions, pamphlets, or other written materials from your doctor or pharmacy?: 1 - Never  Interpreter Needed?: No  Information entered by :: Jordan Benson   Activities of Daily Living    11/10/2022   11:22 AM  In your present state of health, do you have any difficulty performing the following activities:  Hearing? 0  Benson? 0  Comment glasses  Difficulty concentrating or making decisions? 0  Walking or climbing stairs? 0  Dressing or bathing? 0  Doing errands, shopping? 0  Preparing Food and eating ? N  Using the Toilet? N  In the past six months, have you accidently leaked urine? Y  Do you have problems with loss of bowel control? N  Managing your Medications? N  Managing your Finances? N  Housekeeping or managing your Housekeeping? N    Patient Care Team: Jordan [place], MD as  PCP - General (Internal Medicine)  Indicate any recent Medical Services you may have received from other than [facility] providers in the past year (date may be approximate).     Assessment:   This is a routine wellness examination for Jordan Benson.  Hearing/Benson screen Hearing Screening - Comments:: No issues Benson Screening - Comments:: glasses   Goals Addressed             This Visit's Progress    Patient Stated       Lose weight and exercise more       Depression Screen    11/10/2022   11:25 AM 10/23/2022    8:12 AM 02/19/2022    8:11 AM 10/23/2021   11:37 AM 10/21/2021    4:43 PM 09/11/2021   10:03 AM 08/05/2021    9:18 AM  PHQ 2/9 Scores  PHQ - 2 Score 1 0 0 0   0  PHQ- 9 Score 1 0 0         Information is confidential and restricted. Go to Review Flowsheets to unlock data.    Fall Risk    11/10/2022   11:23 AM 10/23/2022    8:12 AM 02/19/2022    8:11 AM 10/23/2021   11:36 AM 08/05/2021    9:19 AM  Fall Risk   Falls in the past year? 0 0 0 0 0  Number falls in past yr: 0 0 0 0 0  Injury with Fall? 0 0 0 0   Risk for fall due to : No Fall Risks No Fall Risks No Fall Risks No Fall Risks   Follow up Falls prevention discussed;Falls evaluation completed Falls evaluation completed Falls evaluation completed Falls evaluation completed Falls evaluation completed    MEDICARE RISK AT HOME:  Medicare Risk at Home Any stairs in or around the home?: Yes If so, are there any without handrails?: No Home free of loose throw rugs in walkways, pet beds, electrical cords, etc?: Yes Adequate lighting in your home to reduce risk of falls?: Yes Life alert?: No Use of a cane, walker or w/c?: No Grab bars in the bathroom?: No Shower chair or bench in shower?: No Elevated toilet seat or a handicapped toilet?: No    Cognitive Function:        11/10/2022   11:30 AM  6CIT Screen  What Year? 0 points  What month? 0 points  What time? 0 points  Count back from 20 0 points  Months  in reverse 0 points  Repeat phrase 0 points  Total Score 0 points    Immunizations Immunization History  Administered Date(s) Administered   Fluad Quad(high Dose 65+) 10/23/2021   Influenza,inj,Quad PF,6+ Mos 10/17/2014, 10/06/2016, 11/05/2018, 11/13/2020   Influenza-Unspecified 11/09/2013, 11/30/2017, 10/27/2019   Moderna Sars-Covid-2 Vaccination 03/11/2019, 04/08/2019, 12/06/2019   Pneumococcal Conjugate-13 08/01/2019   Rabies, IM 09/04/2021, 09/07/2021, 09/11/2021, 09/18/2021   Tdap 01/01/2016   Zoster Recombinant(Shingrix) 08/05/2020, 10/05/2020    TDAP status: Up to date  Flu Vaccine status: Completed at today's visit  Pneumococcal vaccine status: Up to date  Covid-19 vaccine status: Completed vaccines Will send information  Qualifies for Shingles Vaccine? Yes   Zostavax completed No   Shingrix Completed?: Yes  Screening Tests Health Maintenance  Topic Date Due   Medicare Annual Wellness (AWV)  08/06/2022   INFLUENZA VACCINE  09/10/2022   COVID-19 Vaccine (4 - 2023-24 season) 10/11/2022   Colonoscopy  12/04/2022   Pneumonia Vaccine 52+ Years old (2 of 2 - PPSV23 or PCV20) 04/23/2023 (Originally 07/31/2020)   MAMMOGRAM  03/12/2023   DTaP/Tdap/Td (2 - Td or Tdap) 12/31/2025   DEXA SCAN  Completed   Zoster Vaccines- Shingrix  Completed   HPV VACCINES  Aged Out   Hepatitis C Screening  Discontinued    Health Maintenance  Health Maintenance Due  Topic Date Due   Medicare Annual Wellness (AWV)  08/06/2022   INFLUENZA VACCINE  09/10/2022   COVID-19 Vaccine (4 - 2023-24 season) 10/11/2022   Colonoscopy  12/04/2022    Colorectal cancer screening: Type of screening: Colonoscopy. Completed 11/2019. Repeat every 3 years  Mammogram status: Completed 02/2022. Repeat every year  Bone Density status: Completed 02/2022. Results reflect: Bone density results: OSTEOPENIA. Repeat every 2 years.  Lung Cancer Screening: (Low Dose CT Chest recommended if Age 45-80 years, 20  pack-year currently smoking OR have quit w/in 15years.) does not qualify.     Additional Screening:  Hepatitis C Screening: does qualify; Completed Declined  Benson Screening: Recommended annual ophthalmology exams for early detection of glaucoma and other disorders of the eye. Is the patient up to date with their annual eye exam?  Yes  Who is the provider or what is the name of the office in which the patient attends annual eye exams? Jordan Benson If pt is not established with a provider, would they like to be referred to a provider to establish care? No .   Dental Screening: Recommended annual dental exams for proper oral hygiene    Community Resource Referral / Chronic Care Management: CRR required this visit?  No   CCM required this visit?  No     Plan:     I have personally reviewed and noted the following in the patient's chart:   Medical and  social history Use of alcohol, tobacco or illicit drugs  Current medications and supplements including opioid prescriptions. Patient is not currently taking opioid prescriptions. Functional ability and status Nutritional status Physical activity Advanced directives List of other physicians Hospitalizations, surgeries, and ER visits in previous 12 months Vitals Screenings to include cognitive, depression, and falls Referrals and appointments  In addition, I have reviewed and discussed with patient certain preventive protocols, quality metrics, and best practice recommendations. A written personalized care plan for preventive services as well as general preventive health recommendations were provided to patient.     Jordan Axon, Benson   16[birth year]   After Visit Summary: (MyChart) Due to this being a telephonic visit, the after visit summary with patients personalized plan was offered to patient via MyChart   Nurse Notes: None

## 2022-11-10 NOTE — Patient Instructions (Signed)
Jordan Benson , Thank you for taking time to come for your Medicare Wellness Visit. I appreciate your ongoing commitment to your health goals. Please review the following plan we discussed and let me know if I can assist you in the future.   Referrals/Orders/Follow-Ups/Clinician Recommendations: None  This is a list of the screening recommended for you and due dates:  Health Maintenance  Topic Date Due   COVID-19 Vaccine (4 - 2023-24 season) 10/11/2022   Colon Cancer Screening  12/04/2022   Pneumonia Vaccine (2 of 2 - PPSV23 or PCV20) 04/23/2023*   Mammogram  03/12/2023   Medicare Annual Wellness Visit  11/10/2023   DTaP/Tdap/Td vaccine (2 - Td or Tdap) 12/31/2025   Flu Shot  Completed   DEXA scan (bone density measurement)  Completed   Zoster (Shingles) Vaccine  Completed   HPV Vaccine  Aged Out   Hepatitis C Screening  Discontinued  *Topic was postponed. The date shown is not the original due date.    Advanced directives: (Copy Requested) Please bring a copy of your health care power of attorney and living will to the office to be added to your chart at your convenience.  Next Medicare Annual Wellness Visit scheduled for next year: Yes 11/15/23 @ 9:45

## 2022-11-11 NOTE — Telephone Encounter (Signed)
Patient had her last colonoscopy in [place] at [facility]. She is wanting to have done closer to home. Ok to refer to practice in [place]?

## 2022-11-11 NOTE — Telephone Encounter (Signed)
Ok to refer to GI of choice.

## 2022-11-18 ENCOUNTER — Encounter: Payer: Self-pay | Admitting: Gastroenterology

## 2022-11-19 ENCOUNTER — Telehealth: Payer: Self-pay

## 2022-11-19 NOTE — Telephone Encounter (Signed)
Patients last colonoscopy was 12/04/19 with Dr. Claudette Head.  Prior to this patient was seen by Dr. Servando Snare in office 07/01/16.  Her colonoscopy is due 12/04/22.  She stated that she must have had insurance changes why she was not able to see [place] for her 12/04/19 colonoscopy.  No GI issues.  She just wants repeat colonoscopy.    Please advise: Who is willing to do patients colonoscopy.  Thanks, [place], [place]

## 2022-11-19 NOTE — Telephone Encounter (Signed)
Pt requesting call back to schedule colonoscopy.

## 2022-11-19 NOTE — Telephone Encounter (Signed)
error 

## 2022-11-19 NOTE — Telephone Encounter (Signed)
Message left for patient to return my call.  

## 2022-11-23 ENCOUNTER — Encounter: Payer: Self-pay | Admitting: Internal Medicine

## 2022-11-23 ENCOUNTER — Other Ambulatory Visit: Payer: Self-pay

## 2022-11-23 ENCOUNTER — Telehealth: Payer: Self-pay

## 2022-11-23 DIAGNOSIS — Z83719 Family history of colon polyps, unspecified: Secondary | ICD-10-CM

## 2022-11-23 MED ORDER — NA SULFATE-K SULFATE-MG SULF 17.5-3.13-1.6 GM/177ML PO SOLN
1.0000 | Freq: Once | ORAL | 0 refills | Status: AC
Start: 2022-11-23 — End: 2022-11-23

## 2022-11-23 NOTE — Telephone Encounter (Signed)
Gastroenterology Pre-Procedure Review  Request Date: 12/22/22 Requesting Physician: Dr. Allegra Lai  PATIENT REVIEW QUESTIONS: The patient responded to the following health history questions as indicated:    1. Are you having any GI issues? no 2. Do you have a personal history of Polyps? yes (last colonoscopy performed by Dr, Claudette Head 12/04/19) 3. Do you have a family history of Colon Cancer or Polyps? yes (family history of colon polyps ) 4. Diabetes Mellitus? no 5. Joint replacements in the past 12 months?no 6. Major health problems in the past 3 months?no 7. Any artificial heart valves, MVP, or defibrillator?no    MEDICATIONS & ALLERGIES:    Patient reports the following regarding taking any anticoagulation/antiplatelet therapy:   Plavix, Coumadin, Eliquis, Xarelto, Lovenox, Pradaxa, Brilinta, or Effient? no Aspirin? no  Patient confirms/reports the following medications:  Current Outpatient Medications  Medication Sig Dispense Refill   amLODipine (NORVASC) 10 MG tablet TAKE 1 TABLET EVERY DAY 90 tablet 3   anastrozole (ARIMIDEX) 1 MG tablet TAKE 1 TABLET EVERY DAY 90 tablet 3   buPROPion (WELLBUTRIN) 75 MG tablet Take 1 tablet (75 mg total) by mouth in the morning. 90 tablet 1   CALCIUM-VITAMIN D PO Take by mouth.     enalapril (VASOTEC) 10 MG tablet Take 1 tablet (10 mg total) by mouth 2 (two) times daily. 180 tablet 3   FLUoxetine (PROZAC) 40 MG capsule Take 1 capsule (40 mg total) by mouth daily. 90 capsule 1   Multiple Vitamins-Calcium (ONE-A-DAY WOMENS PO) Take 1 tablet by mouth daily.     oxybutynin (DITROPAN-XL) 10 MG 24 hr tablet Take 1 tablet (10 mg total) by mouth at bedtime. 90 tablet 3   rosuvastatin (CRESTOR) 10 MG tablet Take 1 tablet (10 mg total) by mouth daily. 90 tablet 3   triamterene-hydrochlorothiazide (MAXZIDE-25) 37.5-25 MG tablet Take 1 tablet by mouth daily. 90 tablet 1   No current facility-administered medications for this visit.    Patient  confirms/reports the following allergies:  No Known Allergies  No orders of the defined types were placed in this encounter.   AUTHORIZATION INFORMATION Primary Insurance: 1D#: Group #:  Secondary Insurance: 1D#: Group #:  SCHEDULE INFORMATION: Date: 12/22/22 Time: Location: [facility]

## 2022-11-25 DIAGNOSIS — F411 Generalized anxiety disorder: Secondary | ICD-10-CM | POA: Diagnosis not present

## 2022-12-14 DIAGNOSIS — R152 Fecal urgency: Secondary | ICD-10-CM | POA: Diagnosis not present

## 2022-12-14 DIAGNOSIS — R339 Retention of urine, unspecified: Secondary | ICD-10-CM | POA: Diagnosis not present

## 2022-12-14 DIAGNOSIS — N3946 Mixed incontinence: Secondary | ICD-10-CM | POA: Diagnosis not present

## 2022-12-14 DIAGNOSIS — R3915 Urgency of urination: Secondary | ICD-10-CM | POA: Diagnosis not present

## 2022-12-15 ENCOUNTER — Encounter: Payer: Self-pay | Admitting: Gastroenterology

## 2022-12-15 ENCOUNTER — Other Ambulatory Visit: Payer: Self-pay | Admitting: Internal Medicine

## 2022-12-15 DIAGNOSIS — C44319 Basal cell carcinoma of skin of other parts of face: Secondary | ICD-10-CM | POA: Diagnosis not present

## 2022-12-16 DIAGNOSIS — F411 Generalized anxiety disorder: Secondary | ICD-10-CM | POA: Diagnosis not present

## 2022-12-16 NOTE — Anesthesia Preprocedure Evaluation (Addendum)
Anesthesia Evaluation  Patient identified by MRN, date of birth, ID band Patient awake    Reviewed: Allergy & Precautions, H&P , NPO status , Patient's Chart, lab work & pertinent test results  History of Anesthesia Complications (+) PONV and history of anesthetic complications  Airway Mallampati: III  TM Distance: >3 FB Neck ROM: Full    Dental no notable dental hx.  [place] :   Pulmonary shortness of breath, former smoker   Pulmonary exam normal breath sounds clear to auscultation       Cardiovascular hypertension, + CABG  Normal cardiovascular exam Rhythm:Regular Rate:Normal     Neuro/Psych  Headaches PSYCHIATRIC DISORDERS Anxiety Depression     negative psych ROS   GI/Hepatic Neg liver ROS,GERD  ,,  Endo/Other  negative endocrine ROS    Renal/GU negative Renal ROS  negative genitourinary   Musculoskeletal  (+) Arthritis ,    Abdominal   Peds negative pediatric ROS (+)  Hematology negative hematology ROS (+)   Anesthesia Other Findings Mixed incontinence Hypertension GERD (gastroesophageal reflux disease) IBS (irritable bowel syndrome)  Anxiety Vitamin D deficiency  Headache Complication of anesthesia  PONV (postoperative nausea and vomiting) Hyperlipidemia  Melanoma (HCC) Breast cancer (HCC)   Patient reports severe PONV, so am giving zofran 4 mg IV pre-procedure, patient is happy about that   Reproductive/Obstetrics negative OB ROS                             Anesthesia Physical Anesthesia Plan  ASA: 3  Anesthesia Plan: General   Post-op Pain Management:    Induction: Intravenous  PONV Risk Score and Plan:   Airway Management Planned: Natural Airway and Nasal Cannula  Additional Equipment:   Intra-op Plan:   Post-operative Plan:   Informed Consent: I have reviewed the patients History and Physical, chart, labs and discussed the procedure including the  risks, benefits and alternatives for the proposed anesthesia with the patient or authorized representative who has indicated his/her understanding and acceptance.     Dental Advisory Given  Plan Discussed with: Anesthesiologist, CRNA and Surgeon  Anesthesia Plan Comments: (Patient consented for risks of anesthesia including but not limited to:  - adverse reactions to medications - risk of airway placement if required - damage to eyes, teeth, lips or other oral mucosa - nerve damage due to positioning  - sore throat or hoarseness - Damage to heart, brain, nerves, lungs, other parts of body or loss of life  Patient voiced understanding and assent.)        Anesthesia Quick Evaluation

## 2022-12-18 ENCOUNTER — Encounter: Payer: Self-pay | Admitting: Gastroenterology

## 2022-12-22 ENCOUNTER — Encounter
Admission: RE | Disposition: A | Discharge status: Home / Self Care | Payer: Self-pay | Source: Home / Self Care | Attending: Gastroenterology

## 2022-12-22 ENCOUNTER — Other Ambulatory Visit: Payer: Self-pay

## 2022-12-22 ENCOUNTER — Ambulatory Visit: Payer: Medicare PPO | Admitting: Anesthesiology

## 2022-12-22 ENCOUNTER — Ambulatory Visit
Admission: RE | Admit: 2022-12-22 | Discharge: 2022-12-22 | Disposition: A | Discharge status: Home / Self Care | Payer: Medicare PPO | Attending: Gastroenterology | Admitting: Gastroenterology

## 2022-12-22 ENCOUNTER — Encounter: Payer: Self-pay | Admitting: Gastroenterology

## 2022-12-22 DIAGNOSIS — K219 Gastro-esophageal reflux disease without esophagitis: Secondary | ICD-10-CM | POA: Insufficient documentation

## 2022-12-22 DIAGNOSIS — Z85820 Personal history of malignant melanoma of skin: Secondary | ICD-10-CM | POA: Insufficient documentation

## 2022-12-22 DIAGNOSIS — K635 Polyp of colon: Secondary | ICD-10-CM | POA: Diagnosis not present

## 2022-12-22 DIAGNOSIS — Z860101 Personal history of adenomatous and serrated colon polyps: Secondary | ICD-10-CM | POA: Diagnosis not present

## 2022-12-22 DIAGNOSIS — K589 Irritable bowel syndrome without diarrhea: Secondary | ICD-10-CM | POA: Insufficient documentation

## 2022-12-22 DIAGNOSIS — I10 Essential (primary) hypertension: Secondary | ICD-10-CM | POA: Insufficient documentation

## 2022-12-22 DIAGNOSIS — Z791 Long term (current) use of non-steroidal anti-inflammatories (NSAID): Secondary | ICD-10-CM | POA: Diagnosis not present

## 2022-12-22 DIAGNOSIS — Z87891 Personal history of nicotine dependence: Secondary | ICD-10-CM | POA: Diagnosis not present

## 2022-12-22 DIAGNOSIS — Z09 Encounter for follow-up examination after completed treatment for conditions other than malignant neoplasm: Secondary | ICD-10-CM | POA: Insufficient documentation

## 2022-12-22 DIAGNOSIS — Z853 Personal history of malignant neoplasm of breast: Secondary | ICD-10-CM | POA: Diagnosis not present

## 2022-12-22 DIAGNOSIS — Z1211 Encounter for screening for malignant neoplasm of colon: Secondary | ICD-10-CM | POA: Diagnosis not present

## 2022-12-22 DIAGNOSIS — K5730 Diverticulosis of large intestine without perforation or abscess without bleeding: Secondary | ICD-10-CM | POA: Diagnosis not present

## 2022-12-22 DIAGNOSIS — D124 Benign neoplasm of descending colon: Secondary | ICD-10-CM | POA: Insufficient documentation

## 2022-12-22 DIAGNOSIS — D123 Benign neoplasm of transverse colon: Secondary | ICD-10-CM | POA: Insufficient documentation

## 2022-12-22 DIAGNOSIS — Z83719 Family history of colon polyps, unspecified: Secondary | ICD-10-CM | POA: Insufficient documentation

## 2022-12-22 DIAGNOSIS — Z860100 Personal history of colon polyps, unspecified: Secondary | ICD-10-CM | POA: Diagnosis not present

## 2022-12-22 HISTORY — PX: POLYPECTOMY: SHX5525

## 2022-12-22 HISTORY — PX: COLONOSCOPY WITH PROPOFOL: SHX5780

## 2022-12-22 SURGERY — COLONOSCOPY WITH PROPOFOL
Anesthesia: General

## 2022-12-22 MED ORDER — ONDANSETRON HCL 4 MG/2ML IJ SOLN
4.0000 mg | Freq: Once | INTRAMUSCULAR | Status: AC
  Administered 2022-12-22: 4 mg via INTRAVENOUS

## 2022-12-22 MED ORDER — PROPOFOL 10 MG/ML IV BOLUS
INTRAVENOUS | Status: DC | PRN
  Administered 2022-12-22: 50 mg via INTRAVENOUS

## 2022-12-22 MED ORDER — STERILE WATER FOR IRRIGATION IR SOLN
Status: DC | PRN
  Administered 2022-12-22: 1

## 2022-12-22 MED ORDER — SODIUM CHLORIDE 0.9% FLUSH: 10.0000 mL | INTRAVENOUS | Status: DC | PRN

## 2022-12-22 MED ORDER — SODIUM CHLORIDE 0.9 % IV SOLN
INTRAVENOUS | Status: DC
Start: 2022-12-22 — End: 2022-12-22

## 2022-12-22 MED ORDER — PROPOFOL 500 MG/50ML IV EMUL
INTRAVENOUS | Status: DC | PRN
  Administered 2022-12-22: 140 ug/kg/min via INTRAVENOUS

## 2022-12-22 MED ORDER — ONDANSETRON HCL 4 MG/2ML IJ SOLN
INTRAMUSCULAR | Status: AC
  Filled 2022-12-22: qty 2

## 2022-12-22 SURGICAL SUPPLY — 10 items
CLIP HMST 235XBRD CATH ROT (MISCELLANEOUS) IMPLANT
CLIP RESOLUTION 360 11X235 (MISCELLANEOUS) ×2
GOWN CVR UNV OPN BCK APRN NK (MISCELLANEOUS) ×4 IMPLANT
GOWN ISOL THUMB LOOP REG UNIV (MISCELLANEOUS) ×4
KIT PRC NS LF DISP ENDO (KITS) ×2 IMPLANT
KIT PROCEDURE OLYMPUS (KITS) ×2
MANIFOLD NEPTUNE II (INSTRUMENTS) ×2 IMPLANT
SNARE COLD EXACTO (MISCELLANEOUS) IMPLANT
TRAP ETRAP POLY (MISCELLANEOUS) IMPLANT
WATER STERILE IRR 250ML POUR (IV SOLUTION) ×2 IMPLANT

## 2022-12-22 NOTE — Op Note (Signed)
[facility] Patient Name: Jordan Benson Procedure Date: 12/22/2022 9:04 AM MRN: [id number] Account #: [id number] Date of Birth: December 07, 1955 Admit Type: Outpatient Age: 67 Room: [facility] OR ROOM 01 Gender: Female Note Status: Finalized Instrument Name: [id number] Procedure:             Colonoscopy Indications:           Surveillance: Personal history of adenomatous polyps                         on last colonoscopy 3 years ago, Last colonoscopy:                         October 2021 Providers:             Toney Reil MD, MD Referring MD:          Toney Reil MD, MD (Referring MD), Dale Bendon, MD (Referring MD) Medicines:             General Anesthesia Complications:         No immediate complications. Estimated blood loss: None. Procedure:             Pre-Anesthesia Assessment:                        - Prior to the procedure, a History and Physical was                         performed, and patient medications and allergies were                         reviewed. The patient is competent. The risks and                         benefits of the procedure and the sedation options and                         risks were discussed with the patient. All questions                         were answered and informed consent was obtained.                         Patient identification and proposed procedure were                         verified by the physician, the nurse, the                         anesthesiologist, the anesthetist and the technician                         in the pre-procedure area in the procedure room in the                         endoscopy suite. Mental Status Examination: alert and  oriented. Airway Examination: normal oropharyngeal                         airway and neck mobility. Respiratory Examination:                         clear to auscultation. CV Examination: normal.                          Prophylactic Antibiotics: The patient does not require                         prophylactic antibiotics. Prior Anticoagulants: The                         patient has taken no anticoagulant or antiplatelet                         agents. ASA Grade Assessment: III - A patient with                         severe systemic disease. After reviewing the risks and                         benefits, the patient was deemed in satisfactory                         condition to undergo the procedure. The anesthesia                         plan was to use general anesthesia. Immediately prior                         to administration of medications, the patient was                         re-assessed for adequacy to receive sedatives. The                         heart rate, respiratory rate, oxygen saturations,                         blood pressure, adequacy of pulmonary ventilation, and                         response to care were monitored throughout the                         procedure. The physical status of the patient was                         re-assessed after the procedure.                        After obtaining informed consent, the colonoscope was                         passed under direct vision. Throughout the procedure,  the patient's blood pressure, pulse, and oxygen                         saturations were monitored continuously. The was                         introduced through the anus and advanced to the the                         cecum, identified by appendiceal orifice and ileocecal                         valve. The colonoscopy was performed with moderate                         difficulty due to multiple diverticula in the colon.                         Successful completion of the procedure was aided by                         applying abdominal pressure. The patient tolerated the                         procedure well. The  quality of the bowel preparation                         was evaluated using the BBPS [facility] Bowel Preparation                         Scale) with scores of: Right Colon = 3, Transverse                         Colon = 3 and Left Colon = 3 (entire mucosa seen well                         with no residual staining, small fragments of stool or                         opaque liquid). The total BBPS score equals 9. The                         ileocecal valve, appendiceal orifice, and rectum were                         photographed. Findings:      The perianal and digital rectal examinations were normal. Pertinent       negatives include normal sphincter tone and no palpable rectal lesions.      Two sessile polyps were found in the transverse colon. The polyps were 5       to 6 mm in size. These polyps were removed with a cold snare. Resection       and retrieval were complete. Estimated blood loss was minimal. To       prevent bleeding after the polypectomy, one hemostatic clip was       successfully placed (MR safe). Clip manufacturer: [employer].  There was no bleeding at the end of the procedure.      Two sessile polyps were found in the descending colon. The polyps were 3       to 4 mm in size. These polyps were removed with a cold snare. Resection       and retrieval were complete. Estimated blood loss: none.      Multiple diverticula were found in the recto-sigmoid colon and sigmoid       colon.      The retroflexed view of the distal rectum and anal verge was normal and       showed no anal or rectal abnormalities. Impression:            - Two 5 to 6 mm polyps in the transverse colon,                         removed with a cold snare. Resected and retrieved.                         Clip manufacturer: [employer]. Clip (MR safe)                         was placed.                        - Two 3 to 4 mm polyps in the descending colon,                         removed  with a cold snare. Resected and retrieved.                        - Diverticulosis in the recto-sigmoid colon and in the                         sigmoid colon.                        - The distal rectum and anal verge are normal on                         retroflexion view. Recommendation:        - Discharge patient to home (with escort).                        - Resume previous diet today.                        - Continue present medications.                        - Await pathology results.                        - Repeat colonoscopy in 3 years for surveillance of                         multiple polyps. Procedure Code(s):     --- Professional ---                        [phone], Colonoscopy, flexible;  with removal of                         tumor(s), polyp(s), or other lesion(s) by snare                         technique Diagnosis Code(s):     --- Professional ---                        Z86.010, Personal history of colonic polyps                        D12.3, Benign neoplasm of transverse colon (hepatic                         flexure or splenic flexure)                        D12.4, Benign neoplasm of descending colon                        K57.30, Diverticulosis of large intestine without                         perforation or abscess without bleeding CPT copyright 2022 American Medical Association. All rights reserved. The codes documented in this report are preliminary and upon coder review may  be revised to meet current compliance requirements. Dr. Libby Maw Toney Reil MD, MD 12/22/2022 9:40:42 AM This report has been signed electronically. Number of Addenda: 0 Note Initiated On: 12/22/2022 9:04 AM Scope Withdrawal Time: 0 hours 14 minutes 39 seconds  Total Procedure Duration: 0 hours 20 minutes 19 seconds  Estimated Blood Loss:  Estimated blood loss: none.      [facility]

## 2022-12-22 NOTE — Anesthesia Postprocedure Evaluation (Signed)
Anesthesia Post Note  Patient: Jordan Benson  Procedure(s) Performed: COLONOSCOPY WITH PROPOFOL POLYPECTOMY  Patient location during evaluation: PACU Anesthesia Type: General Level of consciousness: awake and alert Pain management: pain level controlled Vital Signs Assessment: post-procedure vital signs reviewed and stable Respiratory status: spontaneous breathing, nonlabored ventilation, respiratory function stable and patient connected to nasal cannula oxygen Cardiovascular status: blood pressure returned to baseline and stable Postop Assessment: no apparent nausea or vomiting Anesthetic complications: no   No notable events documented.   Last Vitals:  Vitals:   12/22/22 0958 12/22/22 1002  BP: 113/64   Pulse:    Resp:    Temp:    SpO2: 98% 97%    Last Pain:  Vitals:   12/22/22 1002  TempSrc:   PainSc: 0-No pain                 Wiktoria Hemrick C Abbie Jablon

## 2022-12-22 NOTE — Transfer of Care (Signed)
Immediate Anesthesia Transfer of Care Note  Patient: Jordan Benson  Procedure(s) Performed: COLONOSCOPY WITH PROPOFOL POLYPECTOMY  Patient Location: PACU  Anesthesia Type: General  Level of Consciousness: awake, alert  and patient cooperative  Airway and Oxygen Therapy: Patient Spontanous Breathing and Patient connected to supplemental oxygen  Post-op Assessment: Post-op Vital signs reviewed, Patient's Cardiovascular Status Stable, Respiratory Function Stable, Patent Airway and No signs of Nausea or vomiting  Post-op Vital Signs: Reviewed and stable  Complications: No notable events documented.

## 2022-12-22 NOTE — H&P (Signed)
Arlyss Repress, MD [address]  [address]  [place], [place] [address]  Main: [phone]  Fax: [phone] Pager: [phone]  Primary Care Physician:  Dale Willernie, MD Primary Gastroenterologist:  Dr. Arlyss Repress  Pre-Procedure History & Physical: HPI:  Jordan Benson is a 67 y.o. female is here for an colonoscopy.   Past Medical History:  Diagnosis Date   Anxiety    Breast cancer (HCC) 12/2017   hyperplasia only not cancer per pt   Complication of anesthesia    nausea   GERD (gastroesophageal reflux disease)    Headache    optical migraines   Hyperlipidemia    Hypertension    IBS (irritable bowel syndrome)    Melanoma (HCC) 2019   followed by Dr Adolphus Birchwood, on left arm   PONV (postoperative nausea and vomiting)    Vitamin D deficiency     Past Surgical History:  Procedure Laterality Date   ABDOMINAL HYSTERECTOMY  1992   BREAST BIOPSY Left 12/21/2017   affirm bx of asymmetry, ATYPICAL DUCTAL PAPILLARY LESION WITH ADJACENT ATYPICAL DUCTAL HYPERPLASIA.    BREAST BIOPSY Left 12/21/2017   [place] bx of LN, benign   BREAST LUMPECTOMY Left 01/05/2018   ADH   BREAST LUMPECTOMY Left 01/12/2018   neg   BREAST LUMPECTOMY WITH NEEDLE LOCALIZATION Left 01/05/2018   Procedure: BREAST LUMPECTOMY WITH NEEDLE LOCALIZATION;  Surgeon: Henrene Dodge, MD;  Location: [facility] ORS;  Service: General;  Laterality: Left;   BREAST SURGERY     COLONOSCOPY  2001, 2016   x2 ARM and at [facility]   JOINT REPLACEMENT Right 10/2017   TKR   MOHS SURGERY  2012   OVARIAN CYST REMOVAL  1991   POLYPECTOMY     RE-EXCISION OF BREAST LUMPECTOMY Left 01/12/2018   Procedure: RE-EXCISION OF BREAST LUMPECTOMY;  Surgeon: Henrene Dodge, MD;  Location: [facility] ORS;  Service: General;  Laterality: Left;   SEPTOPLASTY  2009   TOTAL KNEE 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   Procedure: RIGHT TOTAL KNEE ARTHROPLASTY;  Surgeon: Eugenia Mcalpine, MD;  Location: [facility] ORS;  Service: Orthopedics;  Laterality: Right;  2 hrs    TUBAL LIGATION  1985   tummy tuck  1995   WISDOM TOOTH EXTRACTION      Prior to Admission medications   Medication Sig Start Date End Date Taking? Authorizing Provider  amLODipine (NORVASC) 10 MG tablet TAKE 1 TABLET EVERY DAY 03/06/22  Yes Dale Weyer[employer]euser, MD  anastrozole (ARIMIDEX) 1 MG tablet TAKE 1 TABLET EVERY DAY 05/28/22  Yes Earna Coder, MD  buPROPion (WELLBUTRIN) 75 MG tablet TAKE 1 TABLET EVERY MORNING 12/16/22  Yes Dale [place], MD  CALCIUM-VITAMIN D PO Take by mouth.   Yes [provider]  enalapril (VASOTEC) 10 MG tablet Take 1 tablet (10 mg total) by mouth 2 (two) times daily. 04/23/22  Yes Dale Hudson, MD  FLUoxetine (PROZAC) 40 MG capsule TAKE 1 CAPSULE EVERY DAY 12/16/22  Yes Dale Bradford, MD  Multiple Vitamins-Calcium (ONE-A-DAY WOMENS PO) Take 1 tablet by mouth daily.   Yes [provider]  rosuvastatin (CRESTOR) 10 MG tablet Take 1 tablet (10 mg total) by mouth daily. 04/23/22  Yes Dale McElhattan, MD  triamterene-hydrochlorothiazide (MAXZIDE-25) 37.5-25 MG tablet Take 1 tablet by mouth daily. 08/17/22  Yes Dale Caldwell, MD  oxybutynin (DITROPAN-XL) 10 MG 24 hr tablet Take 1 tablet (10 mg total) by mouth at bedtime. Patient not taking: Reported on 12/22/2022 10/23/22   Bethanie Dicker, NP    Allergies  as of 11/23/2022   (No Known Allergies)    Family History  Problem Relation Age of Onset   Cancer Mother 106       breast cancer   Heart disease Mother    Hypertension Mother    Diabetes Mother    Breast cancer Mother 47   Heart disease Father    Depression Sister    Breast cancer Sister 53       on HRT   Colon polyps Sister    Alcohol abuse Maternal Grandfather    Alcohol abuse Paternal Grandfather    Alcohol abuse Paternal Grandmother    Anxiety disorder Daughter    Colon cancer Neg Hx    Esophageal cancer Neg Hx    Rectal cancer Neg Hx    Stomach cancer Neg Hx     Social History   Socioeconomic History   Marital  status: Widowed    Spouse name: Greggory Stallion   Number of children: 2   Years of education: Not on file   Highest education level: Not on file  Occupational History    Employer: [employer]  Tobacco Use   Smoking status: Former    Current packs/day: 0.00    Average packs/day: 0.5 packs/day for 10.0 years (5.0 ttl pk-yrs)    Types: Cigarettes    Start date: 03/12/1990    Quit date: 03/12/2000    Years since quitting: 22.7   Smokeless tobacco: Never  Vaping Use   Vaping status: Never Used  Substance and Sexual Activity   Alcohol use: Not Currently    Comment: wine daily- moderate use   Drug use: Never    Comment: gummies   Sexual activity: Not Currently  Other Topics Concern   Not on file  Social History Narrative   Widow   Retired from [employer]   Social Determinants of Health   Financial Resource Strain: Low Risk  (11/10/2022)   Overall Financial Resource Strain (CARDIA)    Difficulty of Paying Living Expenses: Not hard at all  Food Insecurity: No Food Insecurity (11/10/2022)   Hunger Vital Sign    Worried About Running Out of Food in the Last Year: Never true    Ran Out of Food in the Last Year: Never true  Transportation Needs: No Transportation Needs (11/10/2022)   PRAPARE - Administrator, Civil Service (Medical): No    Lack of Transportation (Non-Medical): No  Physical Activity: Insufficiently Active (11/10/2022)   Exercise Vital Sign    Days of Exercise per Week: 2 days    Minutes of Exercise per Session: 40 min  Stress: Stress Concern Present (11/10/2022)   [employer] of Occupational Health - Occupational Stress Questionnaire    Feeling of Stress : To some extent  Social Connections: Socially Isolated (11/10/2022)   Social Connection and Isolation Panel [NHANES]    Frequency of Communication with Friends and Family: More than three times a week    Frequency of Social Gatherings with Friends and Family: More than three times a week    Attends Religious  Services: Never    Database administrator or Organizations: No    Attends Banker Meetings: Never    Marital Status: Widowed  Intimate Partner Violence: Not At Risk (11/10/2022)   Humiliation, Afraid, Rape, and Kick questionnaire    Fear of Current or Ex-Partner: No    Emotionally Abused: No    Physically Abused: No    Sexually Abused: No  Review of Systems: See HPI, otherwise negative ROS  Physical Exam: BP 115/68   Pulse 89   Temp 97.8 F (36.6 C) (Temporal)   Resp (!) 97   Ht 5' 7.99" (1.727 m)   Wt 89.8 kg   SpO2 99%   BMI 30.10 kg/m  General:   Alert,  pleasant and cooperative in NAD Head:  Normocephalic and atraumatic. Neck:  Supple; no masses or thyromegaly. Lungs:  Clear throughout to auscultation.    Heart:  Regular rate and rhythm. Abdomen:  Soft, nontender and nondistended. Normal bowel sounds, without guarding, and without rebound.   Neurologic:  Alert and  oriented x4;  grossly normal neurologically.  Impression/Plan: PATRIECE DANESE is here for an colonoscopy to be performed for h/o colon adenomas  Risks, benefits, limitations, and alternatives regarding  colonoscopy have been reviewed with the patient.  Questions have been answered.  All parties agreeable.   Lannette Donath, MD  12/22/2022, 8:24 AM

## 2022-12-23 ENCOUNTER — Encounter: Payer: Self-pay | Admitting: Gastroenterology

## 2022-12-23 LAB — SURGICAL PATHOLOGY

## 2022-12-25 ENCOUNTER — Encounter: Payer: Self-pay | Admitting: Gastroenterology

## 2022-12-25 ENCOUNTER — Encounter: Payer: Self-pay | Admitting: Nurse Practitioner

## 2022-12-25 NOTE — Telephone Encounter (Signed)
Thank her for the update.  Please call and see if she needs anything more for me to do at this time.  Also can schedule f/u appt

## 2022-12-27 DIAGNOSIS — Z681 Body mass index (BMI) 19 or less, adult: Secondary | ICD-10-CM | POA: Diagnosis not present

## 2022-12-27 DIAGNOSIS — N3001 Acute cystitis with hematuria: Secondary | ICD-10-CM | POA: Diagnosis not present

## 2022-12-27 DIAGNOSIS — Z23 Encounter for immunization: Secondary | ICD-10-CM | POA: Diagnosis not present

## 2022-12-31 ENCOUNTER — Encounter: Payer: Self-pay | Admitting: Internal Medicine

## 2022-12-31 ENCOUNTER — Other Ambulatory Visit: Payer: Self-pay

## 2022-12-31 DIAGNOSIS — Z1231 Encounter for screening mammogram for malignant neoplasm of breast: Secondary | ICD-10-CM

## 2023-01-25 DIAGNOSIS — K219 Gastro-esophageal reflux disease without esophagitis: Secondary | ICD-10-CM | POA: Diagnosis not present

## 2023-01-25 DIAGNOSIS — F411 Generalized anxiety disorder: Secondary | ICD-10-CM | POA: Diagnosis not present

## 2023-01-25 DIAGNOSIS — K589 Irritable bowel syndrome without diarrhea: Secondary | ICD-10-CM | POA: Diagnosis not present

## 2023-01-25 DIAGNOSIS — I10 Essential (primary) hypertension: Secondary | ICD-10-CM | POA: Diagnosis not present

## 2023-01-25 DIAGNOSIS — E785 Hyperlipidemia, unspecified: Secondary | ICD-10-CM | POA: Diagnosis not present

## 2023-01-25 DIAGNOSIS — G4730 Sleep apnea, unspecified: Secondary | ICD-10-CM | POA: Diagnosis not present

## 2023-01-25 DIAGNOSIS — H269 Unspecified cataract: Secondary | ICD-10-CM | POA: Diagnosis not present

## 2023-01-25 DIAGNOSIS — M1990 Unspecified osteoarthritis, unspecified site: Secondary | ICD-10-CM | POA: Diagnosis not present

## 2023-01-25 DIAGNOSIS — N3946 Mixed incontinence: Secondary | ICD-10-CM | POA: Diagnosis not present

## 2023-01-27 ENCOUNTER — Ambulatory Visit: Payer: Medicare PPO | Admitting: Internal Medicine

## 2023-01-27 VITALS — BP 124/78 | Temp 96.6°F | Ht 67.0 in | Wt 204.4 lb

## 2023-01-27 DIAGNOSIS — F330 Major depressive disorder, recurrent, mild: Secondary | ICD-10-CM

## 2023-01-27 DIAGNOSIS — Z803 Family history of malignant neoplasm of breast: Secondary | ICD-10-CM

## 2023-01-27 DIAGNOSIS — Z85820 Personal history of malignant melanoma of skin: Secondary | ICD-10-CM | POA: Diagnosis not present

## 2023-01-27 DIAGNOSIS — E7800 Pure hypercholesterolemia, unspecified: Secondary | ICD-10-CM

## 2023-01-27 DIAGNOSIS — N6092 Unspecified benign mammary dysplasia of left breast: Secondary | ICD-10-CM | POA: Diagnosis not present

## 2023-01-27 DIAGNOSIS — R350 Frequency of micturition: Secondary | ICD-10-CM

## 2023-01-27 DIAGNOSIS — F419 Anxiety disorder, unspecified: Secondary | ICD-10-CM

## 2023-01-27 DIAGNOSIS — Z713 Dietary counseling and surveillance: Secondary | ICD-10-CM

## 2023-01-27 DIAGNOSIS — R739 Hyperglycemia, unspecified: Secondary | ICD-10-CM

## 2023-01-27 DIAGNOSIS — I10 Essential (primary) hypertension: Secondary | ICD-10-CM

## 2023-01-27 DIAGNOSIS — N393 Stress incontinence (female) (male): Secondary | ICD-10-CM | POA: Diagnosis not present

## 2023-01-27 LAB — BASIC METABOLIC PANEL
BUN: 25 mg/dL — ABNORMAL HIGH (ref 6–23)
CO2: 27 meq/L (ref 19–32)
Calcium: 9.3 mg/dL (ref 8.4–10.5)
Chloride: 104 meq/L (ref 96–112)
Creatinine, Ser: 0.85 mg/dL (ref 0.40–1.20)
GFR: 70.08 mL/min (ref >60.00)
Glucose, Bld: 98 mg/dL (ref 70–99)
Potassium: 3.8 meq/L (ref 3.5–5.1)
Sodium: 139 meq/L (ref 135–145)

## 2023-01-27 LAB — CBC WITH DIFFERENTIAL/PLATELET
Basophils Absolute: 0.1 10*3/uL (ref 0.0–0.1)
Basophils Relative: 0.8 % (ref 0.0–3.0)
Eosinophils Absolute: 0.2 10*3/uL (ref 0.0–0.7)
Eosinophils Relative: 2.3 % (ref 0.0–5.0)
HCT: 40.4 % (ref 36.0–46.0)
Hemoglobin: 13.7 g/dL (ref 12.0–15.0)
Lymphocytes Relative: 26.5 % (ref 12.0–46.0)
Lymphs Abs: 1.7 10*3/uL (ref 0.7–4.0)
MCHC: 34 g/dL (ref 30.0–36.0)
MCV: 90.1 fL (ref 78.0–100.0)
Monocytes Absolute: 0.4 10*3/uL (ref 0.1–1.0)
Monocytes Relative: 6.2 % (ref 3.0–12.0)
Neutro Abs: 4.2 10*3/uL (ref 1.4–7.7)
Neutrophils Relative %: 64.2 % (ref 43.0–77.0)
Platelets: 359 10*3/uL (ref 150.0–400.0)
RBC: 4.48 Mil/uL (ref 3.87–5.11)
RDW: 12.1 % (ref 11.5–15.5)
WBC: 6.5 10*3/uL (ref 4.0–10.5)

## 2023-01-27 LAB — LIPID PANEL
Cholesterol: 145 mg/dL (ref 0–200)
HDL: 53.5 mg/dL (ref >39.00)
LDL Cholesterol: 48 mg/dL (ref 0–99)
NonHDL: 91.33
Total CHOL/HDL Ratio: 3
Triglycerides: 218 mg/dL — ABNORMAL HIGH (ref 0.0–149.0)
VLDL: 43.6 mg/dL — ABNORMAL HIGH (ref 0.0–40.0)

## 2023-01-27 LAB — HEPATIC FUNCTION PANEL
ALT: 13 U/L (ref 0–35)
AST: 17 U/L (ref 0–37)
Albumin: 4.4 g/dL (ref 3.5–5.2)
Alkaline Phosphatase: 65 U/L (ref 39–117)
Bilirubin, Direct: 0.2 mg/dL (ref 0.0–0.3)
Total Bilirubin: 1.6 mg/dL — ABNORMAL HIGH (ref 0.2–1.2)
Total Protein: 7.1 g/dL (ref 6.0–8.3)

## 2023-01-27 LAB — HEMOGLOBIN A1C: Hgb A1c MFr Bld: 5.6 % (ref 4.6–6.5)

## 2023-01-27 MED ORDER — TIRZEPATIDE-WEIGHT MANAGEMENT 2.5 MG/0.5ML ~~LOC~~ SOLN: 2.5000 mg | SUBCUTANEOUS | 2 refills | Status: DC

## 2023-01-27 NOTE — Assessment & Plan Note (Signed)
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 MARCH 2024- Mammo-WNL.   Tolerating well without any major side effects. No mammographic evidence of malignancy.  Will need to be on AI for total of 5 years until January 2026.

## 2023-01-27 NOTE — Assessment & Plan Note (Signed)
On crestor.  Low cholesterol diet and exercise.  Follow lipid panel and liver function tests.   Lab Results  Component Value Date   CHOL 145 01/27/2023   HDL 53.50 01/27/2023   LDLCALC 48 01/27/2023   LDLDIRECT 138.0 03/20/2019   TRIG 218.0 (H) 01/27/2023   CHOLHDL 3 01/27/2023

## 2023-01-27 NOTE — Assessment & Plan Note (Signed)
Overall doing well.  Continue psych.  Prozac and wellbutrin - working well.  Follow.

## 2023-01-27 NOTE — Assessment & Plan Note (Signed)
Low carb diet and exercise.  Follow met b and a1c.   Lab Results  Component Value Date   HGBA1C 5.6 01/27/2023

## 2023-01-27 NOTE — Assessment & Plan Note (Signed)
Taking oxybutynin.  Helping. Discussed pelvic floor physical therapy. Will notify me if desires referral.

## 2023-01-27 NOTE — Assessment & Plan Note (Signed)
Followed by dermatology

## 2023-01-27 NOTE — Assessment & Plan Note (Signed)
Continues on prozac.  Doing well.   ?

## 2023-01-27 NOTE — Assessment & Plan Note (Signed)
Discussed pelvic floor therapy.  Will notify me when agreeable.

## 2023-01-27 NOTE — Assessment & Plan Note (Signed)
Blood pressure as outlined. Not orthostatic on exam today. Blood pressure lying 120/72 and standing 118/68. Will decreased trim/hydrochlorothiazide to 1/2 tablet.  Continues on enalapril - taking daily. Follow pressures.  Stay hydrated.  Compression hose.

## 2023-01-27 NOTE — Progress Notes (Signed)
Subjective:    Patient ID: Jordan Benson, female    DOB: 12/23/1955, 67 y.o.   MRN: [id number]  Patient here for  Chief Complaint  Patient presents with   Dysuria    Pt has had incontinence but she has been to urology and Gyn she just wanted to talk to Dr. Lorin Picket about it.    Medical Management of Chronic Issues    Pt requested a breat exam.     HPI Here for a scheduled follow up. Saw gyn - 12/14/22 - urinary incontinence. Has tried kegels. 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 Recommended trial of myrbetriq. Did not help symptoms. Back on oxybutynin. Helping. Discussed pelvic floor physical therapy. She feels good overall.  Staying active. No chest pain or sob reported. No abdominal pain or bowel change reported. She is concerned regarding her weight. Desires GLP 1 agonist. Discussed. Reports that she had an insurance evaluation. Nurse checked pressure - reported standing pressure in the 90s range. No significant dizziness. Occasional light headedness when she stands up fast.    Past Medical History:  Diagnosis Date   Anxiety    Breast cancer (HCC) 12/2017   hyperplasia only not cancer per pt   Complication of anesthesia    nausea   GERD (gastroesophageal reflux disease)    Headache    optical migraines   Hyperlipidemia    Hypertension    IBS (irritable bowel syndrome)    Melanoma (HCC) 2019   followed by Dr Adolphus Birchwood, on left arm   PONV (postoperative nausea and vomiting)    Vitamin D deficiency    Past Surgical History:  Procedure Laterality Date   ABDOMINAL HYSTERECTOMY  1992   BREAST BIOPSY Left 12/21/2017   affirm bx of asymmetry, ATYPICAL DUCTAL PAPILLARY LESION WITH ADJACENT ATYPICAL DUCTAL HYPERPLASIA.    BREAST BIOPSY Left 12/21/2017   [place] bx of LN, benign   BREAST LUMPECTOMY Left 01/05/2018   ADH   BREAST LUMPECTOMY Left 01/12/2018   neg   BREAST LUMPECTOMY WITH NEEDLE LOCALIZATION Left 01/05/2018   Procedure: BREAST  LUMPECTOMY WITH NEEDLE LOCALIZATION;  Surgeon: Henrene Dodge, MD;  Location: [facility];  Service: General;  Laterality: Left;   BREAST SURGERY     COLONOSCOPY  2001, 2016   x2 ARM and at [facility]   COLONOSCOPY WITH PROPOFOL N/A 12/22/2022   Procedure: COLONOSCOPY WITH PROPOFOL;  Surgeon: Toney Reil, MD;  Location: [facility];  Service: Endoscopy;  Laterality: N/A;   JOINT REPLACEMENT Right 10/2017   TKR   MOHS SURGERY  2012   OVARIAN CYST REMOVAL  1991   POLYPECTOMY     POLYPECTOMY  12/22/2022   Procedure: POLYPECTOMY;  Surgeon: Toney Reil, MD;  Location: [facility];  Service: Endoscopy;;   RE-EXCISION OF BREAST LUMPECTOMY Left 01/12/2018   Procedure: RE-EXCISION OF BREAST LUMPECTOMY;  Surgeon: Henrene Dodge, MD;  Location: [facility];  Service: General;  Laterality: Left;   SEPTOPLASTY  2009   TOTAL KNEE 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   Procedure: RIGHT TOTAL KNEE ARTHROPLASTY;  Surgeon: Eugenia Mcalpine, MD;  Location: [facility];  Service: Orthopedics;  Laterality: Right;  2 hrs   TUBAL LIGATION  1985   tummy tuck  1995   WISDOM TOOTH EXTRACTION     Family History  Problem Relation Age of Onset   Cancer Mother 98       breast cancer   Heart disease Mother    Hypertension Mother  Diabetes Mother    Breast cancer Mother 49   Heart disease Father    Depression Sister    Breast cancer Sister 77       on HRT   Colon polyps Sister    Alcohol abuse Maternal Grandfather    Alcohol abuse Paternal Grandfather    Alcohol abuse Paternal Grandmother    Anxiety disorder Daughter    Colon cancer Neg Hx    Esophageal cancer Neg Hx    Rectal cancer Neg Hx    Stomach cancer Neg Hx    Social History   Socioeconomic History   Marital status: Widowed    Spouse name: Greggory Stallion   Number of children: 2   Years of education: Not on file   Highest education level: Associate degree: occupational, Scientist, product/process development, or vocational program  Occupational History    Employer:  [employer]  Tobacco Use   Smoking status: Former    Current packs/day: 0.00    Average packs/day: 0.5 packs/day for 10.0 years (5.0 ttl pk-yrs)    Types: Cigarettes    Start date: 03/12/1990    Quit date: 03/12/2000    Years since quitting: 22.8   Smokeless tobacco: Never  Vaping Use   Vaping status: Never Used  Substance and Sexual Activity   Alcohol use: Not Currently    Comment: wine daily- moderate use   Drug use: Never    Comment: gummies   Sexual activity: Not Currently  Other Topics Concern   Not on file  Social History Narrative   Widow   Retired from [employer]   Social Drivers of Health   Financial Resource Strain: Low Risk  (01/27/2023)   Overall Financial Resource Strain (CARDIA)    Difficulty of Paying Living Expenses: Not hard at all  Food Insecurity: No Food Insecurity (01/27/2023)   Hunger Vital Sign    Worried About Running Out of Food in the Last Year: Never true    Ran Out of Food in the Last Year: Never true  Transportation Needs: No Transportation Needs (01/27/2023)   PRAPARE - Administrator, Civil Service (Medical): No    Lack of Transportation (Non-Medical): No  Physical Activity: Inactive (01/27/2023)   Exercise Vital Sign    Days of Exercise per Week: 0 days    Minutes of Exercise per Session: 40 min  Stress: No Stress Concern Present (01/27/2023)   [employer] of Occupational Health - Occupational Stress Questionnaire    Feeling of Stress : Only a little  Recent Concern: Stress - Stress Concern Present (11/10/2022)   [employer] of Occupational Health - Occupational Stress Questionnaire    Feeling of Stress : To some extent  Social Connections: Moderately Isolated (01/27/2023)   Social Connection and Isolation Panel [NHANES]    Frequency of Communication with Friends and Family: More than three times a week    Frequency of Social Gatherings with Friends and Family: More than three times a week    Attends Religious Services: 1  to 4 times per year    Active Member of [employer] or Organizations: No    Attends Banker Meetings: Never    Marital Status: Widowed     Review of Systems  Constitutional:  Negative for appetite change and unexpected weight change.  HENT:  Negative for congestion and sinus pressure.   Respiratory:  Negative for cough, chest tightness and shortness of breath.   Cardiovascular:  Negative for chest pain and palpitations.  Gastrointestinal:  Negative for abdominal pain, diarrhea, nausea and vomiting.  Genitourinary:  Negative for difficulty urinating and dysuria.       Increased urinary frequency. Some minimal incontinence.   Musculoskeletal:  Negative for joint swelling and myalgias.  Skin:  Negative for color change and rash.  Neurological:  Negative for headaches.       No significant dizziness.  Does report occasional light headedness if stands quickly. Resolves quickly.   Psychiatric/Behavioral:  Negative for agitation and dysphoric mood.        Objective:     BP 124/78   Temp (!) 96.6 F (35.9 C)   Ht 5\' 7"  (1.702 m)   Wt 204 lb 6.4 oz (92.7 kg)   SpO2 97%   BMI 32.01 kg/m  Wt Readings from Last 3 Encounters:  01/27/23 204 lb 6.4 oz (92.7 kg)  12/22/22 197 lb 14.4 oz (89.8 kg)  11/10/22 203 lb 6 oz (92.3 kg)    Physical Exam Vitals reviewed.  Constitutional:      General: She is not in acute distress.    Appearance: Normal appearance. She is well-developed.  HENT:     Head: Normocephalic and atraumatic.     Right Ear: External ear normal.     Left Ear: External ear normal.     Mouth/Throat:     Pharynx: No oropharyngeal exudate or posterior oropharyngeal erythema.  Eyes:     General: No scleral icterus.       Right eye: No discharge.        Left eye: No discharge.     Conjunctiva/sclera: Conjunctivae normal.  Neck:     Thyroid: No thyromegaly.  Cardiovascular:     Rate and Rhythm: Normal rate and regular rhythm.  Pulmonary:     Effort: No  tachypnea, accessory muscle usage or respiratory distress.     Breath sounds: Normal breath sounds. No decreased breath sounds or wheezing.  Chest:  Breasts:    Right: No inverted nipple, mass, nipple discharge or tenderness (no axillary adenopathy).     Left: No inverted nipple, mass, nipple discharge or tenderness (no axilarry adenopathy).  Abdominal:     General: Bowel sounds are normal.     Palpations: Abdomen is soft.     Tenderness: There is no abdominal tenderness.  Musculoskeletal:        General: No swelling or tenderness.     Cervical back: Neck supple.  Lymphadenopathy:     Cervical: No cervical adenopathy.  Skin:    Findings: No erythema or rash.  Neurological:     Mental Status: She is alert and oriented to person, place, and time.  Psychiatric:        Mood and Affect: Mood normal.        Behavior: Behavior normal.      Outpatient Encounter Medications as of 01/27/2023  Medication Sig   amLODipine (NORVASC) 10 MG tablet TAKE 1 TABLET EVERY DAY   anastrozole (ARIMIDEX) 1 MG tablet TAKE 1 TABLET EVERY DAY   buPROPion (WELLBUTRIN) 75 MG tablet TAKE 1 TABLET EVERY MORNING   CALCIUM-VITAMIN D PO Take by mouth.   enalapril (VASOTEC) 10 MG tablet Take 1 tablet (10 mg total) by mouth 2 (two) times daily.   FLUoxetine (PROZAC) 40 MG capsule TAKE 1 CAPSULE EVERY DAY   Multiple Vitamins-Calcium (ONE-A-DAY WOMENS PO) Take 1 tablet by mouth daily.   rosuvastatin (CRESTOR) 10 MG tablet Take 1 tablet (10 mg total) by mouth daily.   tirzepatide (ZEPBOUND) 2.5  MG/0.5ML injection vial Inject 2.5 mg into the skin once a week.   triamterene-hydrochlorothiazide (MAXZIDE-25) 37.5-25 MG tablet Take 1 tablet by mouth daily.   No facility-administered encounter medications on file as of 01/27/2023.     Lab Results  Component Value Date   WBC 6.5 01/27/2023   HGB 13.7 01/27/2023   HCT 40.4 01/27/2023   PLT 359.0 01/27/2023   GLUCOSE 98 01/27/2023   CHOL 145 01/27/2023   TRIG  218.0 (H) 01/27/2023   HDL 53.50 01/27/2023   LDLDIRECT 138.0 03/20/2019   LDLCALC 48 01/27/2023   ALT 13 01/27/2023   AST 17 01/27/2023   NA 139 01/27/2023   K 3.8 01/27/2023   CL 104 01/27/2023   CREATININE 0.85 01/27/2023   BUN 25 (H) 01/27/2023   CO2 27 01/27/2023   TSH 1.55 04/20/2022   INR 0.94 08/13/2017   HGBA1C 5.6 01/27/2023       Assessment & Plan:  Urinary frequency Assessment & Plan: Taking oxybutynin.  Helping. Discussed pelvic floor physical therapy. Will notify me if desires referral.    Stress incontinence Assessment & Plan: Discussed pelvic floor therapy.  Will notify me when agreeable.    Hypercholesterolemia Assessment & Plan: On crestor.  Low cholesterol diet and exercise.  Follow lipid panel and liver function tests.   Lab Results  Component Value Date   CHOL 145 01/27/2023   HDL 53.50 01/27/2023   LDLCALC 48 01/27/2023   LDLDIRECT 138.0 03/20/2019   TRIG 218.0 (H) 01/27/2023   CHOLHDL 3 01/27/2023    Orders: -     Basic metabolic panel -     Hepatic function panel -     Lipid panel -     CBC with Differential/Platelet  Hyperglycemia Assessment & Plan: Low carb diet and exercise.  Follow met b and a1c.   Lab Results  Component Value Date   HGBA1C 5.6 01/27/2023    Orders: -     Hemoglobin A1c  MDD (major depressive disorder), recurrent episode, mild (HCC) Assessment & Plan: Overall doing well.  Continue psych.  Prozac and wellbutrin - working well.  Follow.    History of melanoma Assessment & Plan: Followed by dermatology.     Family history of breast cancer Assessment & Plan: Mother and sister with breast cancer.  Sees oncology.  On arimidex.     Essential hypertension, benign Assessment & Plan: Blood pressure as outlined. Not orthostatic on exam today. Blood pressure lying 120/72 and standing 118/68. Will decreased trim/hydrochlorothiazide to 1/2 tablet.  Continues on enalapril - taking daily. Follow pressures.  Stay  hydrated.  Compression hose.    Atypical ductal hyperplasia of left breast Assessment & Plan: Left breast atypical ductal hyperplasia [January 2021-Arimidex ]currently on antihormone therapy with armidex 1mg /day. MARCH 2024- Mammo-WNL.   Tolerating well without any major side effects. No mammographic evidence of malignancy.  Will need to be on AI for total of 5 years until January 2026.    Anxiety Assessment & Plan: Continues on prozac.  Doing well.     Weight loss counseling, encounter for Assessment & Plan: Discussed weight loss medication. Desires to start GLP 1 agonist. Start zepbound. Discussed possible side effects.  Discussed contraindications. Start 2.5mg  q week.  Follow.    Other orders -     Tirzepatide-Weight Management; Inject 2.5 mg into the skin once a week.  Dispense: 2 mL; Refill: 2     Dale Belleville, MD

## 2023-01-27 NOTE — Assessment & Plan Note (Signed)
Discussed weight loss medication. Desires to start GLP 1 agonist. Start zepbound. Discussed possible side effects.  Discussed contraindications. Start 2.5mg  q week.  Follow.

## 2023-01-27 NOTE — Assessment & Plan Note (Signed)
Mother and sister with breast cancer.  Sees oncology.  On arimidex.

## 2023-01-28 ENCOUNTER — Encounter: Payer: Self-pay | Admitting: Internal Medicine

## 2023-01-29 ENCOUNTER — Encounter: Payer: Self-pay | Admitting: Internal Medicine

## 2023-02-01 ENCOUNTER — Telehealth: Payer: Self-pay

## 2023-02-01 NOTE — Telephone Encounter (Signed)
Dr Lorin Picket- disregard this message. She says that there must have been a prior auth or something because she picked up her medication for $64

## 2023-02-01 NOTE — Telephone Encounter (Signed)
Zepbound will cost pt [phone]

## 2023-02-01 NOTE — Telephone Encounter (Signed)
See result note.  

## 2023-02-01 NOTE — Telephone Encounter (Signed)
Copied from CRM [id number]. Topic: Clinical - Lab/Test Results >> Feb 01, 2023  7:55 AM Jordan Benson wrote: Patient called, stating she received a call Friday evening around 4PM. She believes it was in regards to her results and is requesting a call back.

## 2023-02-08 ENCOUNTER — Ambulatory Visit: Payer: Self-pay | Admitting: Internal Medicine

## 2023-02-08 NOTE — Telephone Encounter (Signed)
Per chart review, appt scheduled

## 2023-02-08 NOTE — Telephone Encounter (Signed)
Copied from CRM [id number]. Topic: Clinical - Red Word Triage >> Feb 08, 2023  9:49 AM Gurney Maxin H wrote: Red Word that prompted transfer to Nurse Triage: Patient states she has another UTI, having pressure, urine frequency and pain with a little burning.   Chief Complaint: Urinary frequency  Symptoms: Increased urinary frequency, dysuria, decreased urinary output  Frequency: Frequent  Pertinent Negatives: Patient denies fever Disposition: [] ED /[] Urgent Care (no appt availability in office) / [x] Appointment(In office/virtual)/ []  [facility]/ [] Home Care/ [] Refused Recommended Disposition /[] [facility]/ []  Follow-up with PCP Additional Notes: Patient reports urinary tract infection symptoms that began last night. She reports experiencing increased urinary frequency, dysuria, decreased output, stating "it's like I'm not emptying my bladder." She reports a long history of UTIs and states her current symptoms are similar to previous infections. She denies any fevers. Patient will follow up with clinical office in the morning.    Reason for Disposition  Urinating more frequently than usual (i.e., frequency)  Answer Assessment - Initial Assessment Questions 1. SYMPTOM: "What's the main symptom you're concerned about?" (e.g., frequency, incontinence)     Pressure, burning, increased frequency, decreased output  2. ONSET: "When did the symptoms start?"     Last night  3. PAIN: "Is there any pain?" If Yes, ask: "How bad is it?" (Scale: 1-10; mild, moderate, severe)     2/10 4. CAUSE: "What do you think is causing the symptoms?"     Urinary tract infection  5. OTHER SYMPTOMS: "Do you have any other symptoms?" (e.g., blood in urine, fever, flank pain, pain with urination)     Pain with urination 6. PREGNANCY: "Is there any chance you are pregnant?" "When was your last menstrual period?"     No  Protocols used: Urinary Symptoms-A-AH

## 2023-02-09 ENCOUNTER — Ambulatory Visit: Payer: Medicare PPO | Admitting: Family Medicine

## 2023-02-09 ENCOUNTER — Telehealth: Payer: Self-pay | Admitting: *Deleted

## 2023-02-09 DIAGNOSIS — N952 Postmenopausal atrophic vaginitis: Secondary | ICD-10-CM | POA: Diagnosis not present

## 2023-02-09 DIAGNOSIS — N3941 Urge incontinence: Secondary | ICD-10-CM | POA: Diagnosis not present

## 2023-02-09 DIAGNOSIS — R300 Dysuria: Secondary | ICD-10-CM | POA: Diagnosis not present

## 2023-02-09 DIAGNOSIS — R152 Fecal urgency: Secondary | ICD-10-CM | POA: Diagnosis not present

## 2023-02-09 NOTE — Telephone Encounter (Signed)
 Ms [place], [place] from [facility] called asking Dr Senaida Lange stance with this patient to use Vaginal Estrogen due to having repeated UTI's and vaginal atrophy. Please return her call [phone]

## 2023-02-09 NOTE — Telephone Encounter (Signed)
 Attempted times 2 to return call to Ms Anne Hahn and the phone rang until it hung up

## 2023-02-09 NOTE — Telephone Encounter (Signed)
 Call to patient to inform of response and she said that she will send a message to [facility]

## 2023-02-22 DIAGNOSIS — F411 Generalized anxiety disorder: Secondary | ICD-10-CM | POA: Diagnosis not present

## 2023-03-03 ENCOUNTER — Encounter: Payer: Self-pay | Admitting: Internal Medicine

## 2023-03-03 ENCOUNTER — Other Ambulatory Visit: Payer: Self-pay

## 2023-03-03 MED ORDER — TIRZEPATIDE-WEIGHT MANAGEMENT 2.5 MG/0.5ML ~~LOC~~ SOLN: 2.5000 mg | SUBCUTANEOUS | 2 refills | Status: DC

## 2023-03-05 ENCOUNTER — Telehealth: Payer: Self-pay

## 2023-03-05 ENCOUNTER — Encounter: Payer: Self-pay | Admitting: Internal Medicine

## 2023-03-05 ENCOUNTER — Other Ambulatory Visit (HOSPITAL_COMMUNITY): Payer: Self-pay

## 2023-03-05 NOTE — Telephone Encounter (Signed)
Pharmacy Patient Advocate Encounter  Received notification from [facility] that Prior Authorization for Zepbound 2.5MG /0.5ML pen-injectors has been DENIED.  Full denial letter will be uploaded to the media tab. See denial reason below.   PA #/Case ID/Reference #: [id number]

## 2023-03-05 NOTE — Telephone Encounter (Signed)
Copied from CRM [id number]. Topic: Clinical - Medication Question >> Mar 05, 2023  3:02 PM Fredrich Romans wrote: Reason for CRM: Patient called in to ask about PA that had to be started for her Zepbound medication. She is wondering why it has to go through another PA process when she has already had It authorized one time before.She has been on this medication for a month now.

## 2023-03-05 NOTE — Telephone Encounter (Signed)
*  Primary  Pharmacy Patient Advocate Encounter   Received notification from Fax that prior authorization for Zepbound 2.5MG /0.5ML pen-injectors  is required/requested.   Insurance verification completed.   The patient is insured through [employer] .   Per test claim: PA required; PA submitted to above mentioned insurance via CoverMyMeds Key/confirmation #/EOC [id number] Status is pending

## 2023-03-08 NOTE — Telephone Encounter (Signed)
See other note

## 2023-03-08 NOTE — Telephone Encounter (Signed)
Pt is aware see my chart.

## 2023-03-09 ENCOUNTER — Ambulatory Visit: Payer: Medicare PPO | Admitting: Internal Medicine

## 2023-03-10 ENCOUNTER — Telehealth: Payer: Self-pay

## 2023-03-10 NOTE — Telephone Encounter (Signed)
Called patient to let her know that her zepbound was denied by insurance and was not coded differently. Offered her info [employer] self pay and patient declined.

## 2023-03-10 NOTE — Telephone Encounter (Signed)
Copied from CRM [id number]. Topic: Clinical - Medication Question >> Mar 10, 2023 12:09 PM Larwance Sachs wrote: Reason for CRM: Patient called in to be relayed message in MyChart due to not having access as of now, relayed messages regarding zepbound and patient is wondering if when zepbound was prescribed originally and covered by insurance was medication was maybe coded differently or why exactly it was originally cover and is now no longer  Please call back at [phone]

## 2023-03-15 ENCOUNTER — Ambulatory Visit: Payer: Medicare PPO

## 2023-04-12 ENCOUNTER — Ambulatory Visit: Payer: Medicare PPO

## 2023-04-12 ENCOUNTER — Other Ambulatory Visit: Payer: Self-pay | Admitting: Internal Medicine

## 2023-04-15 ENCOUNTER — Other Ambulatory Visit: Payer: Self-pay

## 2023-04-15 DIAGNOSIS — N6092 Unspecified benign mammary dysplasia of left breast: Secondary | ICD-10-CM

## 2023-04-15 MED ORDER — ANASTROZOLE 1 MG PO TABS: 1.0000 mg | ORAL_TABLET | Freq: Every day | ORAL | 0 refills | Status: DC

## 2023-04-19 ENCOUNTER — Ambulatory Visit: Payer: Medicare PPO

## 2023-04-21 ENCOUNTER — Ambulatory Visit
Admission: RE | Admit: 2023-04-21 | Discharge: 2023-04-21 | Disposition: A | Discharge status: Home / Self Care | Payer: Medicare PPO | Source: Ambulatory Visit | Attending: Internal Medicine | Admitting: Internal Medicine

## 2023-04-21 ENCOUNTER — Encounter: Payer: Self-pay | Admitting: Internal Medicine

## 2023-04-21 DIAGNOSIS — Z1231 Encounter for screening mammogram for malignant neoplasm of breast: Secondary | ICD-10-CM | POA: Diagnosis not present

## 2023-04-21 NOTE — Telephone Encounter (Signed)
 Fyi.

## 2023-04-22 ENCOUNTER — Encounter: Payer: Self-pay | Admitting: Internal Medicine

## 2023-04-26 DIAGNOSIS — F411 Generalized anxiety disorder: Secondary | ICD-10-CM | POA: Diagnosis not present

## 2023-04-27 ENCOUNTER — Other Ambulatory Visit: Payer: Self-pay | Admitting: Internal Medicine

## 2023-04-27 DIAGNOSIS — N6092 Unspecified benign mammary dysplasia of left breast: Secondary | ICD-10-CM

## 2023-05-04 ENCOUNTER — Telehealth: Admitting: Internal Medicine

## 2023-05-04 VITALS — Ht 68.5 in | Wt 191.0 lb

## 2023-05-04 DIAGNOSIS — R0681 Apnea, not elsewhere classified: Secondary | ICD-10-CM | POA: Diagnosis not present

## 2023-05-04 DIAGNOSIS — F330 Major depressive disorder, recurrent, mild: Secondary | ICD-10-CM | POA: Diagnosis not present

## 2023-05-04 NOTE — Progress Notes (Unsigned)
 Patient ID: Jordan Benson, female   DOB: [birth year], 68 y.o.   MRN: [id number]   Virtual Visit via video Note  I connected with Jordan Benson by a video enabled telemedicine application and verified that I am speaking with the correct person using two identifiers. Location patient: home Location provider: work Persons participating in the virtual visit: patient, provider  The limitations, risks, security and privacy concerns of performing an evaluation and management service by video and the availability of in person appointments have been discussed. It has also been discussed with the patient that there may be a patient responsible charge related to this service. The patient expressed understanding and agreed to proceed.   Reason for visit: work in appt  HPI: Work in appt to discuss further evaluation for possible sleep apnea. Reports that her daughter mentioned she noticed episodes where she stops breathing - while sleeping. Most go to bed around 9:30 and will get up around 6-7:30. One time nocturia. Does nap daily. Discussed further evaluation for sleep apnea. Otherwise doing well. She reports no alcohol intake.    ROS: See pertinent positives and negatives per HPI.  Past Medical History:  Diagnosis Date   Anxiety    Breast cancer (HCC) 12/2017   hyperplasia only not cancer per pt   Complication of anesthesia    nausea   GERD (gastroesophageal reflux disease)    Headache    optical migraines   Hyperlipidemia    Hypertension    IBS (irritable bowel syndrome)    Melanoma (HCC) 2019   followed by Dr Adolphus Birchwood, on left arm   PONV (postoperative nausea and vomiting)    Vitamin D deficiency     Past Surgical History:  Procedure Laterality Date   ABDOMINAL HYSTERECTOMY  1992   BREAST BIOPSY Left 12/21/2017   affirm bx of asymmetry, ATYPICAL DUCTAL PAPILLARY LESION WITH ADJACENT ATYPICAL DUCTAL HYPERPLASIA.    BREAST BIOPSY Left 12/21/2017   [place] bx of LN, benign   BREAST  LUMPECTOMY Left 01/05/2018   ADH   BREAST LUMPECTOMY Left 01/12/2018   neg   BREAST LUMPECTOMY WITH NEEDLE LOCALIZATION Left 01/05/2018   Procedure: BREAST LUMPECTOMY WITH NEEDLE LOCALIZATION;  Surgeon: Henrene Dodge, MD;  Location: [facility];  Service: General;  Laterality: Left;   BREAST SURGERY     COLONOSCOPY  2001, 2016   x2 ARM and at [facility]   COLONOSCOPY WITH PROPOFOL N/A 12/22/2022   Procedure: COLONOSCOPY WITH PROPOFOL;  Surgeon: Toney Reil, MD;  Location: [facility];  Service: Endoscopy;  Laterality: N/A;   JOINT REPLACEMENT Right 10/2017   TKR   MOHS SURGERY  2012   OVARIAN CYST REMOVAL  1991   POLYPECTOMY     POLYPECTOMY  12/22/2022   Procedure: POLYPECTOMY;  Surgeon: Toney Reil, MD;  Location: [facility];  Service: Endoscopy;;   RE-EXCISION OF BREAST LUMPECTOMY Left 01/12/2018   Procedure: RE-EXCISION OF BREAST LUMPECTOMY;  Surgeon: Henrene Dodge, MD;  Location: [facility];  Service: General;  Laterality: Left;   SEPTOPLASTY  2009   TOTAL KNEE 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   Procedure: RIGHT TOTAL KNEE ARTHROPLASTY;  Surgeon: Eugenia Mcalpine, MD;  Location: [facility];  Service: Orthopedics;  Laterality: Right;  2 hrs   TUBAL LIGATION  1985   tummy tuck  1995   WISDOM TOOTH EXTRACTION      Family History  Problem Relation Age of Onset   Cancer Mother 31       breast cancer  Heart disease Mother    Hypertension Mother    Diabetes Mother    Breast cancer Mother 4   Heart disease Father    Depression Sister    Breast cancer Sister 68       on HRT   Colon polyps Sister    Alcohol abuse Maternal Grandfather    Alcohol abuse Paternal Grandfather    Alcohol abuse Paternal Grandmother    Anxiety disorder Daughter    Colon cancer Neg Hx    Esophageal cancer Neg Hx    Rectal cancer Neg Hx    Stomach cancer Neg Hx     SOCIAL HX: reviewed.    Current Outpatient Medications:    amLODipine (NORVASC) 10 MG tablet, TAKE 1 TABLET EVERY  DAY, Disp: 90 tablet, Rfl: 0   anastrozole (ARIMIDEX) 1 MG tablet, TAKE 1 TABLET EVERY DAY, Disp: 90 tablet, Rfl: 3   buPROPion (WELLBUTRIN) 75 MG tablet, TAKE 1 TABLET EVERY MORNING, Disp: 90 tablet, Rfl: 3   CALCIUM-VITAMIN D PO, Take by mouth., Disp: , Rfl:    enalapril (VASOTEC) 10 MG tablet, Take 1 tablet (10 mg total) by mouth 2 (two) times daily., Disp: 180 tablet, Rfl: 3   FLUoxetine (PROZAC) 40 MG capsule, TAKE 1 CAPSULE EVERY DAY, Disp: 90 capsule, Rfl: 3   Multiple Vitamins-Calcium (ONE-A-DAY WOMENS PO), Take 1 tablet by mouth daily., Disp: , Rfl:    rosuvastatin (CRESTOR) 10 MG tablet, Take 1 tablet (10 mg total) by mouth daily., Disp: 90 tablet, Rfl: 3   triamterene-hydrochlorothiazide (MAXZIDE-25) 37.5-25 MG tablet, TAKE 1 TABLET EVERY DAY, Disp: 90 tablet, Rfl: 0  EXAM:  GENERAL: alert, oriented, appears well and in no acute distress  HEENT: atraumatic, conjunttiva clear, no obvious abnormalities on inspection of external nose and ears  NECK: normal movements of the head and neck  LUNGS: on inspection no signs of respiratory distress, breathing rate appears normal, no obvious gross SOB, gasping or wheezing  CV: no obvious cyanosis  PSYCH/NEURO: pleasant and cooperative, no obvious depression or anxiety, speech and thought processing grossly intact  ASSESSMENT AND PLAN:  Discussed the following assessment and plan:  Problem List Items Addressed This Visit     MDD (major depressive disorder), recurrent episode, mild (HCC)   Continues on prozac and wellbutrin. Doing well. Follow.       Witnessed apneic spells - Primary   Daughter reported noticing apneic episodes while she is sleeping. Discussed sleep apnea. Discussed further w/up. Refer to pulmonary for further evaluation.       Relevant Orders   Ambulatory referral to Pulmonology    Return if symptoms worsen or fail to improve.   I discussed the assessment and treatment plan with the patient. The patient was  provided an opportunity to ask questions and all were answered. The patient agreed with the plan and demonstrated an understanding of the instructions.   The patient was advised to call back or seek an in-person evaluation if the symptoms worsen or if the condition fails to improve as anticipated.    Dale Brightwood, MD

## 2023-05-04 NOTE — Telephone Encounter (Signed)
 See if she is agreeable for virtual today to discuss - as we discussed. Thanks

## 2023-05-04 NOTE — Telephone Encounter (Signed)
 Pt scheduled

## 2023-05-05 DIAGNOSIS — F411 Generalized anxiety disorder: Secondary | ICD-10-CM | POA: Diagnosis not present

## 2023-05-07 ENCOUNTER — Encounter: Payer: Self-pay | Admitting: Internal Medicine

## 2023-05-07 ENCOUNTER — Encounter: Payer: Self-pay | Admitting: Sleep Medicine

## 2023-05-07 ENCOUNTER — Ambulatory Visit: Admitting: Sleep Medicine

## 2023-05-07 ENCOUNTER — Ambulatory Visit: Payer: Medicare PPO

## 2023-05-07 ENCOUNTER — Other Ambulatory Visit: Payer: Medicare PPO

## 2023-05-07 ENCOUNTER — Inpatient Hospital Stay: Payer: Medicare PPO | Admitting: Internal Medicine

## 2023-05-07 VITALS — BP 112/76 | HR 83 | Temp 97.7°F | Ht 68.5 in | Wt 195.0 lb

## 2023-05-07 DIAGNOSIS — I10 Essential (primary) hypertension: Secondary | ICD-10-CM | POA: Diagnosis not present

## 2023-05-07 DIAGNOSIS — G4733 Obstructive sleep apnea (adult) (pediatric): Secondary | ICD-10-CM

## 2023-05-07 NOTE — Progress Notes (Signed)
 Name:Jordan Benson MRN: [id number] DOB: [birth year]   CHIEF COMPLAINT:  EXCESSIVE DAYTIME SLEEPINESS, SNORING   HISTORY OF PRESENT ILLNESS:  Jordan Benson is a 68 y.o. w/ a h/o HTN, hyperlipidemia, anxiety and depression who presents for c/o loud snoring, witnessed apnea and occasional daytime sleepiness which has been present for several years. Reports nocturnal awakenings due to nocturia, however does not have difficulty falling back to sleep. Reports a 15 lb weight loss over the last few months. Admits to dry mouth. Denies morning headaches, RLS symptoms, dream enactment, cataplexy, hypnagogic or hypnapompic hallucinations. Denies a family history of sleep apnea. Denies drowsy driving. Drinks 2 cups of coffee daily, denies alcohol or tobacco use. Reports taking an occasional marijuana gummy at bedtime.   Bedtime 9:30-10 pm Sleep onset 5 mins Rise time 7:30 am   EPWORTH SLEEP SCORE 7    05/07/2023   10:54 AM  Results of the Epworth flowsheet  Sitting and reading 1  Watching TV 1  Sitting, inactive in a public place (e.g. a theatre or a meeting) 0  As a passenger in a car for an hour without a break 1  Lying down to rest in the afternoon when circumstances permit 3  Sitting and talking to someone 0  Sitting quietly after a lunch without alcohol 1  In a car, while stopped for a few minutes in traffic 0  Total score 7    PAST MEDICAL HISTORY :   has a past medical history of Anxiety, Breast cancer (HCC) (12/2017), Complication of anesthesia, GERD (gastroesophageal reflux disease), Headache, Hyperlipidemia, Hypertension, IBS (irritable bowel syndrome), Melanoma (HCC) (2019), PONV (postoperative nausea and vomiting), and Vitamin D deficiency.  has a past surgical history that includes Abdominal hysterectomy (1992); Tubal ligation (1985); tummy tuck (1995); Ovarian cyst removal (1991); Septoplasty (2009); Mohs surgery (2012); Wisdom tooth extraction; Total knee arthroplasty  (Right, 08/27/2017); Breast surgery; Joint replacement (Right, 10/2017); Breast lumpectomy with needle localization (Left, 01/05/2018); Re-excision of breast lumpectomy (Left, 01/12/2018); Breast biopsy (Left, 12/21/2017); Breast biopsy (Left, 12/21/2017); Colonoscopy (2001, 2016); Polypectomy; Breast lumpectomy (Left, 01/05/2018); Breast lumpectomy (Left, 01/12/2018); Colonoscopy with propofol (N/A, 12/22/2022); and polypectomy (12/22/2022). Prior to Admission medications   Medication Sig Start Date End Date Taking? Authorizing Provider  amLODipine (NORVASC) 10 MG tablet TAKE 1 TABLET EVERY DAY 04/13/23  Yes Dale Noble, MD  anastrozole (ARIMIDEX) 1 MG tablet TAKE 1 TABLET EVERY DAY 04/27/23  Yes Earna Coder, MD  buPROPion (WELLBUTRIN) 75 MG tablet TAKE 1 TABLET EVERY MORNING 12/16/22  Yes Dale Vernon, MD  CALCIUM-VITAMIN D PO Take by mouth.   Yes [provider]  enalapril (VASOTEC) 10 MG tablet Take 1 tablet (10 mg total) by mouth 2 (two) times daily. 04/23/22  Yes Dale Mills, MD  FLUoxetine (PROZAC) 40 MG capsule TAKE 1 CAPSULE EVERY DAY 12/16/22  Yes Dale Franklin, MD  Multiple Vitamins-Calcium (ONE-A-DAY WOMENS PO) Take 1 tablet by mouth daily.   Yes [provider]  oxybutynin (DITROPAN-XL) 10 MG 24 hr tablet Take 10 mg by mouth at bedtime.   Yes [provider]  rosuvastatin (CRESTOR) 10 MG tablet Take 1 tablet (10 mg total) by mouth daily. 04/23/22  Yes Dale [place], MD  triamterene-hydrochlorothiazide (MAXZIDE-25) 37.5-25 MG tablet TAKE 1 TABLET EVERY DAY 04/13/23  Yes Dale Becker, MD   No Known Allergies  FAMILY HISTORY:  family history includes Alcohol abuse in her maternal grandfather, paternal grandfather, and paternal grandmother; Anxiety disorder in her  daughter; Breast cancer (age of onset: 47) in her sister; Breast cancer (age of onset: 16) in her mother; Cancer (age of onset: 23) in her mother; Colon polyps in her sister; Depression  in her sister; Diabetes in her mother; Heart disease in her father and mother; Hypertension in her mother. SOCIAL HISTORY:  reports that she quit smoking about 23 years ago. Her smoking use included cigarettes. She started smoking about 33 years ago. She has a 5 pack-year smoking history. She has never used smokeless tobacco. She reports that she does not currently use alcohol. She reports that she does not use drugs.   Review of Systems:  Gen:  Denies  fever, sweats, chills weight loss  HEENT: Denies blurred vision, double vision, ear pain, eye pain, hearing loss, nose bleeds, sore throat Cardiac:  No dizziness, chest pain or heaviness, chest tightness,edema, No JVD Resp:   No cough, -sputum production, -shortness of breath,-wheezing, -hemoptysis,  Gi: Denies swallowing difficulty, stomach pain, nausea or vomiting, diarrhea, constipation, bowel incontinence Gu:  Denies bladder incontinence, burning urine Ext:   Denies Joint pain, stiffness or swelling Skin: Denies  skin rash, easy bruising or bleeding or hives Endoc:  Denies polyuria, polydipsia , polyphagia or weight change Psych:   Denies depression, insomnia or hallucinations  Other:  All other systems negative  VITAL SIGNS: BP 112/76 (BP Location: Left Arm, Patient Position: Sitting, Cuff Size: Normal)   Pulse 83   Temp 97.7 F (36.5 C) (Temporal)   Ht 5' 8.5" (1.74 m)   Wt 195 lb (88.5 kg)   SpO2 97%   BMI 29.22 kg/m     Physical Examination:   General Appearance: No distress  EYES PERRLA, EOM intact.   NECK Supple, No JVD Pulmonary: normal breath sounds, No wheezing.  CardiovascularNormal S1,S2.  No m/r/g.   Abdomen: Benign, Soft, non-tender. Skin:   warm, no rashes, no ecchymosis  Extremities: normal, no cyanosis, clubbing. Neuro:without focal findings,  speech normal  PSYCHIATRIC: Mood, affect within normal limits.   ASSESSMENT AND PLAN  OSA I suspect that OSA is likely present due to clinical presentation.  Discussed the consequences of untreated sleep apnea. Advised not to drive drowsy for safety of patient and others. Will complete further evaluation with a home sleep study and follow up to review results.    HTN Stable, on current management. Following with PCP.    MEDICATION ADJUSTMENTS/LABS AND TESTS ORDERED: Recommend Sleep Study   Patient  satisfied with Plan of action and management. All questions answered  Follow up to review HST results and treatment plan.   I spent a total of 45 minutes reviewing chart data, face-to-face evaluation with the patient, counseling and coordination of care as detailed above.    Tempie Hoist, M.D.  Sleep Medicine [facility]

## 2023-05-07 NOTE — Assessment & Plan Note (Signed)
 Daughter reported noticing apneic episodes while she is sleeping. Discussed sleep apnea. Discussed further w/up. Refer to pulmonary for further evaluation.

## 2023-05-07 NOTE — Patient Instructions (Signed)
 Jordan Benson

## 2023-05-07 NOTE — Assessment & Plan Note (Signed)
 Continues on prozac and wellbutrin. Doing well. Follow.

## 2023-05-11 DIAGNOSIS — F418 Other specified anxiety disorders: Secondary | ICD-10-CM | POA: Diagnosis not present

## 2023-05-11 DIAGNOSIS — F1011 Alcohol abuse, in remission: Secondary | ICD-10-CM | POA: Diagnosis not present

## 2023-05-11 DIAGNOSIS — N3281 Overactive bladder: Secondary | ICD-10-CM | POA: Diagnosis not present

## 2023-05-11 DIAGNOSIS — R32 Unspecified urinary incontinence: Secondary | ICD-10-CM | POA: Diagnosis not present

## 2023-05-11 DIAGNOSIS — E782 Mixed hyperlipidemia: Secondary | ICD-10-CM | POA: Diagnosis not present

## 2023-05-11 DIAGNOSIS — Z6829 Body mass index (BMI) 29.0-29.9, adult: Secondary | ICD-10-CM | POA: Diagnosis not present

## 2023-05-11 DIAGNOSIS — I10 Essential (primary) hypertension: Secondary | ICD-10-CM | POA: Diagnosis not present

## 2023-05-19 DIAGNOSIS — F411 Generalized anxiety disorder: Secondary | ICD-10-CM | POA: Diagnosis not present

## 2023-05-20 DIAGNOSIS — L570 Actinic keratosis: Secondary | ICD-10-CM | POA: Diagnosis not present

## 2023-05-20 DIAGNOSIS — D225 Melanocytic nevi of trunk: Secondary | ICD-10-CM | POA: Diagnosis not present

## 2023-05-20 DIAGNOSIS — Z85828 Personal history of other malignant neoplasm of skin: Secondary | ICD-10-CM | POA: Diagnosis not present

## 2023-05-20 DIAGNOSIS — D2262 Melanocytic nevi of left upper limb, including shoulder: Secondary | ICD-10-CM | POA: Diagnosis not present

## 2023-05-20 DIAGNOSIS — Z85820 Personal history of malignant melanoma of skin: Secondary | ICD-10-CM | POA: Diagnosis not present

## 2023-05-20 DIAGNOSIS — D2272 Melanocytic nevi of left lower limb, including hip: Secondary | ICD-10-CM | POA: Diagnosis not present

## 2023-05-20 DIAGNOSIS — C44719 Basal cell carcinoma of skin of left lower limb, including hip: Secondary | ICD-10-CM | POA: Diagnosis not present

## 2023-05-20 DIAGNOSIS — D2261 Melanocytic nevi of right upper limb, including shoulder: Secondary | ICD-10-CM | POA: Diagnosis not present

## 2023-05-20 DIAGNOSIS — D485 Neoplasm of uncertain behavior of skin: Secondary | ICD-10-CM | POA: Diagnosis not present

## 2023-05-23 ENCOUNTER — Encounter

## 2023-05-23 DIAGNOSIS — G4733 Obstructive sleep apnea (adult) (pediatric): Secondary | ICD-10-CM

## 2023-05-23 DIAGNOSIS — G4730 Sleep apnea, unspecified: Secondary | ICD-10-CM | POA: Diagnosis not present

## 2023-06-01 DIAGNOSIS — G4733 Obstructive sleep apnea (adult) (pediatric): Secondary | ICD-10-CM | POA: Diagnosis not present

## 2023-06-01 DIAGNOSIS — R0683 Snoring: Secondary | ICD-10-CM | POA: Diagnosis not present

## 2023-06-03 ENCOUNTER — Telehealth: Payer: Self-pay

## 2023-06-03 DIAGNOSIS — G4733 Obstructive sleep apnea (adult) (pediatric): Secondary | ICD-10-CM

## 2023-06-03 NOTE — Telephone Encounter (Signed)
-----   Message from [facility] D REDDY sent at 06/03/2023 11:09 AM EDT ----- Regarding: HST results Please notify patient that HST revealed moderate to severe OSA, recommend proceeding with APAP therapy set to 4-16 cm H2O, EPR 3 with the Airtouch N30i nasal mask. Please schedule a 3 month CPAP follow up visit as well. Thanks ----- Message ----- From: Lynette Saras Sent: 06/02/2023   7:39 AM EDT To: Pallavi D Reddy, MD

## 2023-06-03 NOTE — Telephone Encounter (Signed)
 Patient advised. DME order placed. Appt scheduled. NFN.

## 2023-06-04 ENCOUNTER — Encounter: Payer: Self-pay | Admitting: Internal Medicine

## 2023-06-04 ENCOUNTER — Other Ambulatory Visit: Payer: Self-pay

## 2023-06-04 ENCOUNTER — Ambulatory Visit: Admitting: Sleep Medicine

## 2023-06-04 MED ORDER — ZEPBOUND 2.5 MG/0.5ML ~~LOC~~ SOAJ: 2.5000 mg | SUBCUTANEOUS | 2 refills | Status: DC

## 2023-06-04 NOTE — Telephone Encounter (Signed)
 Zepbound sent in. Pt aware.

## 2023-06-04 NOTE — Telephone Encounter (Signed)
 Ok to send in zepbound

## 2023-06-04 NOTE — Telephone Encounter (Signed)
 Ok to send in zepbouond 2.5?

## 2023-06-05 ENCOUNTER — Other Ambulatory Visit: Payer: Self-pay | Admitting: Internal Medicine

## 2023-06-07 ENCOUNTER — Telehealth: Payer: Self-pay

## 2023-06-07 NOTE — Telephone Encounter (Signed)
 Pharmacy Patient Advocate Encounter   Received notification from CoverMyMeds that prior authorization for Zepbound 2.5MG /0.5ML pen-injectors is required/requested.   Insurance verification completed.   The patient is insured through [employer] .   Per test claim: PA required; PA submitted to above mentioned insurance via CoverMyMeds Key/confirmation #/EOC [id number] Status is pending

## 2023-06-08 DIAGNOSIS — Z961 Presence of intraocular lens: Secondary | ICD-10-CM | POA: Diagnosis not present

## 2023-06-08 NOTE — Telephone Encounter (Signed)
 Pharmacy Patient Advocate Encounter  Received notification from [employer] that Prior Authorization for Zepbound 2.5MG /0.5ML pen-injectors  has been DENIED.  Full denial letter will be uploaded to the media tab. See denial reason below.   PA #/Case ID/Reference #: [id number]

## 2023-06-18 ENCOUNTER — Other Ambulatory Visit: Payer: Self-pay | Admitting: Internal Medicine

## 2023-06-20 ENCOUNTER — Encounter: Payer: Self-pay | Admitting: Nurse Practitioner

## 2023-06-20 ENCOUNTER — Other Ambulatory Visit: Payer: Self-pay | Admitting: Internal Medicine

## 2023-06-21 DIAGNOSIS — F411 Generalized anxiety disorder: Secondary | ICD-10-CM | POA: Diagnosis not present

## 2023-06-21 NOTE — Telephone Encounter (Signed)
 See my chart message

## 2023-06-22 ENCOUNTER — Other Ambulatory Visit: Payer: Self-pay

## 2023-06-22 MED ORDER — OXYBUTYNIN CHLORIDE ER 10 MG PO TB24
10.0000 mg | ORAL_TABLET | Freq: Every day | ORAL | 2 refills | Status: DC
  Filled 2023-06-22: qty 30, 30d supply, fill #0

## 2023-06-22 NOTE — Telephone Encounter (Signed)
 Rx ok'd for oxybutynin . Rx sent in to pharmacy. She does not have a f/u appt or physical scheduled. Please schedule her for a physical.

## 2023-06-22 NOTE — Telephone Encounter (Signed)
 Refilled on 06/22/2023. Is it okay to refuse duplicate request?

## 2023-06-23 ENCOUNTER — Encounter: Payer: Self-pay | Admitting: Internal Medicine

## 2023-06-23 ENCOUNTER — Other Ambulatory Visit: Payer: Self-pay

## 2023-06-23 MED ORDER — OXYBUTYNIN CHLORIDE ER 10 MG PO TB24: 10.0000 mg | ORAL_TABLET | Freq: Every day | ORAL | 2 refills | Status: DC

## 2023-07-14 DIAGNOSIS — F411 Generalized anxiety disorder: Secondary | ICD-10-CM | POA: Diagnosis not present

## 2023-07-19 DIAGNOSIS — C44719 Basal cell carcinoma of skin of left lower limb, including hip: Secondary | ICD-10-CM | POA: Diagnosis not present

## 2023-07-29 DIAGNOSIS — G4733 Obstructive sleep apnea (adult) (pediatric): Secondary | ICD-10-CM | POA: Diagnosis not present

## 2023-08-11 DIAGNOSIS — F411 Generalized anxiety disorder: Secondary | ICD-10-CM | POA: Diagnosis not present

## 2023-08-28 DIAGNOSIS — G4733 Obstructive sleep apnea (adult) (pediatric): Secondary | ICD-10-CM | POA: Diagnosis not present

## 2023-09-02 ENCOUNTER — Ambulatory Visit: Admitting: Sleep Medicine

## 2023-09-02 ENCOUNTER — Encounter: Payer: Self-pay | Admitting: Sleep Medicine

## 2023-09-02 VITALS — BP 110/60 | HR 77 | Temp 98.7°F | Ht 68.5 in | Wt 195.0 lb

## 2023-09-02 DIAGNOSIS — I10 Essential (primary) hypertension: Secondary | ICD-10-CM

## 2023-09-02 DIAGNOSIS — G4733 Obstructive sleep apnea (adult) (pediatric): Secondary | ICD-10-CM | POA: Diagnosis not present

## 2023-09-02 NOTE — Patient Instructions (Addendum)

## 2023-09-02 NOTE — Progress Notes (Signed)
 Name:Jordan Benson MRN: [id number] DOB: November 26, 1955   CHIEF COMPLAINT:  CPAP F/U   HISTORY OF PRESENT ILLNESS:  Ms. Oftedahl is a 68 y.o. w/ a h/o OSA, HTN, depression and hyperlipidemia who presents for CPAP F/U visit. Reports difficulty using CPAP therapy due to mask and pressure discomfort. States that she has trouble finding the right mask with a good mask seal. Reports air leaks and dry mouth. States that she would like to continue trying to use CPAP therapy.    EPWORTH SLEEP SCORE    05/07/2023   10:54 AM  Results of the Epworth flowsheet  Sitting and reading 1  Watching TV 1  Sitting, inactive in a public place (e.g. a theatre or a meeting) 0  As a passenger in a car for an hour without a break 1  Lying down to rest in the afternoon when circumstances permit 3  Sitting and talking to someone 0  Sitting quietly after a lunch without alcohol 1  In a car, while stopped for a few minutes in traffic 0  Total score 7    PAST MEDICAL HISTORY :   has a past medical history of Anxiety, Breast cancer (HCC) (12/2017), Complication of anesthesia, GERD (gastroesophageal reflux disease), Headache, Hyperlipidemia, Hypertension, IBS (irritable bowel syndrome), Melanoma (HCC) (2019), PONV (postoperative nausea and vomiting), and Vitamin D  deficiency.  has a past surgical history that includes Abdominal hysterectomy (1992); Tubal ligation (1985); tummy tuck (1995); Ovarian cyst removal (1991); Septoplasty (2009); Mohs surgery (2012); Wisdom tooth extraction; Total knee arthroplasty (Right, 08/27/2017); Breast surgery; Joint replacement (Right, 10/2017); Breast lumpectomy with needle localization (Left, 01/05/2018); Re-excision of breast lumpectomy (Left, 01/12/2018); Breast biopsy (Left, 12/21/2017); Breast biopsy (Left, 12/21/2017); Colonoscopy (2001, 2016); Polypectomy; Breast lumpectomy (Left, 01/05/2018); Breast lumpectomy (Left, 01/12/2018); Colonoscopy with propofol  (N/A,  12/22/2022); and polypectomy (12/22/2022). Prior to Admission medications   Medication Sig Start Date End Date Taking? Authorizing Provider  amLODipine  (NORVASC ) 10 MG tablet TAKE 1 TABLET EVERY DAY 06/07/23  Yes Glendia Shad, MD  anastrozole  (ARIMIDEX ) 1 MG tablet TAKE 1 TABLET EVERY DAY 04/27/23  Yes Brahmanday, Govinda R, MD  buPROPion  (WELLBUTRIN ) 75 MG tablet TAKE 1 TABLET EVERY MORNING 12/16/22  Yes Glendia Shad, MD  CALCIUM -VITAMIN D  PO Take by mouth.   Yes [provider]  enalapril  (VASOTEC ) 10 MG tablet TAKE 1 TABLET TWICE DAILY 06/07/23  Yes Glendia Shad, MD  estradiol (ESTRACE) 0.1 MG/GM vaginal cream Place 1 Applicatorful vaginally daily.   Yes [provider]  FLUoxetine  (PROZAC ) 40 MG capsule TAKE 1 CAPSULE EVERY DAY 12/16/22  Yes Glendia Shad, MD  Multiple Vitamins-Calcium  (ONE-A-DAY WOMENS PO) Take 1 tablet by mouth daily.   Yes [provider]  oxybutynin  (DITROPAN -XL) 10 MG 24 hr tablet Take 1 tablet (10 mg total) by mouth at bedtime. 06/23/23  Yes Glendia Shad, MD  rosuvastatin  (CRESTOR ) 10 MG tablet TAKE 1 TABLET EVERY DAY 06/07/23  Yes Glendia Shad, MD  triamterene -hydrochlorothiazide  (MAXZIDE -25) 37.5-25 MG tablet TAKE 1 TABLET EVERY DAY 04/13/23  Yes Glendia Shad, MD   No Known Allergies  FAMILY HISTORY:  family history includes Alcohol abuse in her maternal grandfather, paternal grandfather, and paternal grandmother; Anxiety disorder in her daughter; Breast cancer (age of onset: 21) in her sister; Breast cancer (age of onset: 52) in her mother; Cancer (age of onset: 51) in her mother; Colon polyps in her sister; Depression in her sister; Diabetes in her mother; Heart disease in her father and  mother; Hypertension in her mother. SOCIAL HISTORY:  reports that she quit smoking about 23 years ago. Her smoking use included cigarettes. She started smoking about 33 years ago. She has a 5 pack-year smoking history. She has never used  smokeless tobacco. She reports that she does not currently use alcohol. She reports that she does not use drugs.   Review of Systems:  Gen:  Denies  fever, sweats, chills weight loss  HEENT: Denies blurred vision, double vision, ear pain, eye pain, hearing loss, nose bleeds, sore throat Cardiac:  No dizziness, chest pain or heaviness, chest tightness,edema, No JVD Resp:   No cough, -sputum production, -shortness of breath,-wheezing, -hemoptysis,  Gi: Denies swallowing difficulty, stomach pain, nausea or vomiting, diarrhea, constipation, bowel incontinence Gu:  Denies bladder incontinence, burning urine Ext:   Denies Joint pain, stiffness or swelling Skin: Denies  skin rash, easy bruising or bleeding or hives Endoc:  Denies polyuria, polydipsia , polyphagia or weight change Psych:   Denies depression, insomnia or hallucinations  Other:  All other systems negative  VITAL SIGNS: BP 110/60 (BP Location: Right Arm, Patient Position: Sitting, Cuff Size: Large)   Pulse 77   Temp 98.7 F (37.1 C) (Oral)   Ht 5' 8.5 (1.74 m)   Wt 195 lb (88.5 kg)   SpO2 97%   BMI 29.22 kg/m    Physical Examination:   General Appearance: No distress  EYES PERRLA, EOM intact.   NECK Supple, No JVD Pulmonary: normal breath sounds, No wheezing.  CardiovascularNormal S1,S2.  No m/r/g.   Abdomen: Benign, Soft, non-tender. Skin:   warm, no rashes, no ecchymosis  Extremities: normal, no cyanosis, clubbing. Neuro:without focal findings,  speech normal  PSYCHIATRIC: Mood, affect within normal limits.   ASSESSMENT AND PLAN  OSA Changing max pressure to 10 cm H2O. For improve comfort, sending order for mask desensitization. Also counseled patient on proper mask fit. Discussed the consequences of untreated sleep apnea. Advised not to drive drowsy for safety of patient and others. Will follow up in 6 weeks.     HTN Stable, on current management. Following with PCP.    Patient  satisfied with Plan of  action and management. All questions answered  I spent a total of 32 minutes reviewing chart data, face-to-face evaluation with the patient, counseling and coordination of care as detailed above.    Nakeem Murnane, M.D.  Sleep Medicine [facility]

## 2023-09-06 ENCOUNTER — Other Ambulatory Visit (HOSPITAL_BASED_OUTPATIENT_CLINIC_OR_DEPARTMENT_OTHER)

## 2023-09-07 ENCOUNTER — Ambulatory Visit (HOSPITAL_BASED_OUTPATIENT_CLINIC_OR_DEPARTMENT_OTHER): Attending: Sleep Medicine

## 2023-09-07 DIAGNOSIS — G4733 Obstructive sleep apnea (adult) (pediatric): Secondary | ICD-10-CM

## 2023-09-08 ENCOUNTER — Other Ambulatory Visit (HOSPITAL_COMMUNITY): Payer: Self-pay

## 2023-09-28 DIAGNOSIS — G4733 Obstructive sleep apnea (adult) (pediatric): Secondary | ICD-10-CM | POA: Diagnosis not present

## 2023-10-13 DIAGNOSIS — F411 Generalized anxiety disorder: Secondary | ICD-10-CM | POA: Diagnosis not present

## 2023-10-20 DIAGNOSIS — Z23 Encounter for immunization: Secondary | ICD-10-CM | POA: Diagnosis not present

## 2023-10-28 DIAGNOSIS — G4733 Obstructive sleep apnea (adult) (pediatric): Secondary | ICD-10-CM | POA: Diagnosis not present

## 2023-10-29 DIAGNOSIS — G4733 Obstructive sleep apnea (adult) (pediatric): Secondary | ICD-10-CM | POA: Diagnosis not present

## 2023-11-08 ENCOUNTER — Encounter: Payer: Self-pay | Admitting: Internal Medicine

## 2023-11-08 DIAGNOSIS — R739 Hyperglycemia, unspecified: Secondary | ICD-10-CM

## 2023-11-08 DIAGNOSIS — I10 Essential (primary) hypertension: Secondary | ICD-10-CM

## 2023-11-08 DIAGNOSIS — E7800 Pure hypercholesterolemia, unspecified: Secondary | ICD-10-CM

## 2023-11-08 DIAGNOSIS — E559 Vitamin D deficiency, unspecified: Secondary | ICD-10-CM

## 2023-11-08 NOTE — Telephone Encounter (Signed)
 Labs pended for approval

## 2023-11-08 NOTE — Telephone Encounter (Signed)
 I have signed orders for labs. Please schedule her for lab appt.

## 2023-11-09 ENCOUNTER — Other Ambulatory Visit (INDEPENDENT_AMBULATORY_CARE_PROVIDER_SITE_OTHER)

## 2023-11-09 DIAGNOSIS — E559 Vitamin D deficiency, unspecified: Secondary | ICD-10-CM

## 2023-11-09 DIAGNOSIS — I10 Essential (primary) hypertension: Secondary | ICD-10-CM | POA: Diagnosis not present

## 2023-11-09 DIAGNOSIS — R739 Hyperglycemia, unspecified: Secondary | ICD-10-CM

## 2023-11-09 DIAGNOSIS — E7800 Pure hypercholesterolemia, unspecified: Secondary | ICD-10-CM

## 2023-11-09 NOTE — Telephone Encounter (Signed)
 Please schedule her for a fasting lab appointment prior to her Thursday appt with Dr Glendia.

## 2023-11-10 ENCOUNTER — Ambulatory Visit: Payer: Self-pay | Admitting: Internal Medicine

## 2023-11-10 DIAGNOSIS — F411 Generalized anxiety disorder: Secondary | ICD-10-CM | POA: Diagnosis not present

## 2023-11-10 LAB — CBC WITH DIFFERENTIAL/PLATELET
Basophils Absolute: 0.1 K/uL (ref 0.0–0.1)
Basophils Relative: 0.8 % (ref 0.0–3.0)
Eosinophils Absolute: 0.2 K/uL (ref 0.0–0.7)
Eosinophils Relative: 3 % (ref 0.0–5.0)
HCT: 39.4 % (ref 36.0–46.0)
Hemoglobin: 13.3 g/dL (ref 12.0–15.0)
Lymphocytes Relative: 30.2 % (ref 12.0–46.0)
Lymphs Abs: 2.4 K/uL (ref 0.7–4.0)
MCHC: 33.8 g/dL (ref 30.0–36.0)
MCV: 89.2 fl (ref 78.0–100.0)
Monocytes Absolute: 0.5 K/uL (ref 0.1–1.0)
Monocytes Relative: 6.5 % (ref 3.0–12.0)
Neutro Abs: 4.7 K/uL (ref 1.4–7.7)
Neutrophils Relative %: 59.5 % (ref 43.0–77.0)
Platelets: 389 K/uL (ref 150.0–400.0)
RBC: 4.42 Mil/uL (ref 3.87–5.11)
RDW: 12 % (ref 11.5–15.5)
WBC: 7.9 K/uL (ref 4.0–10.5)

## 2023-11-10 LAB — BASIC METABOLIC PANEL WITH GFR
BUN: 18 mg/dL (ref 6–23)
CO2: 27 meq/L (ref 19–32)
Calcium: 9.6 mg/dL (ref 8.4–10.5)
Chloride: 102 meq/L (ref 96–112)
Creatinine, Ser: 0.93 mg/dL (ref 0.40–1.20)
GFR: 62.56 mL/min (ref >60.00)
Glucose, Bld: 90 mg/dL (ref 70–99)
Potassium: 4 meq/L (ref 3.5–5.1)
Sodium: 139 meq/L (ref 135–145)

## 2023-11-10 LAB — LIPID PANEL
Cholesterol: 135 mg/dL (ref 0–200)
HDL: 54.7 mg/dL (ref >39.00)
LDL Cholesterol: 55 mg/dL (ref 0–99)
NonHDL: 80.19
Total CHOL/HDL Ratio: 2
Triglycerides: 126 mg/dL (ref 0.0–149.0)
VLDL: 25.2 mg/dL (ref 0.0–40.0)

## 2023-11-10 LAB — HEPATIC FUNCTION PANEL
ALT: 10 U/L (ref 0–35)
AST: 14 U/L (ref 0–37)
Albumin: 4.4 g/dL (ref 3.5–5.2)
Alkaline Phosphatase: 58 U/L (ref 39–117)
Bilirubin, Direct: 0.3 mg/dL (ref 0.0–0.3)
Total Bilirubin: 1.6 mg/dL — ABNORMAL HIGH (ref 0.2–1.2)
Total Protein: 6.8 g/dL (ref 6.0–8.3)

## 2023-11-10 LAB — TSH: TSH: 2.24 u[IU]/mL (ref 0.35–5.50)

## 2023-11-10 LAB — VITAMIN D 25 HYDROXY (VIT D DEFICIENCY, FRACTURES): VITD: 27.76 ng/mL — ABNORMAL LOW (ref 30.00–100.00)

## 2023-11-10 LAB — HEMOGLOBIN A1C: Hgb A1c MFr Bld: 5.5 % (ref 4.6–6.5)

## 2023-11-11 ENCOUNTER — Ambulatory Visit: Admitting: Internal Medicine

## 2023-11-11 VITALS — BP 120/68 | HR 78 | Resp 16 | Ht 68.0 in | Wt 190.8 lb

## 2023-11-11 DIAGNOSIS — N393 Stress incontinence (female) (male): Secondary | ICD-10-CM

## 2023-11-11 DIAGNOSIS — I10 Essential (primary) hypertension: Secondary | ICD-10-CM

## 2023-11-11 DIAGNOSIS — F330 Major depressive disorder, recurrent, mild: Secondary | ICD-10-CM | POA: Diagnosis not present

## 2023-11-11 DIAGNOSIS — N6092 Unspecified benign mammary dysplasia of left breast: Secondary | ICD-10-CM

## 2023-11-11 DIAGNOSIS — F419 Anxiety disorder, unspecified: Secondary | ICD-10-CM

## 2023-11-11 DIAGNOSIS — R739 Hyperglycemia, unspecified: Secondary | ICD-10-CM

## 2023-11-11 DIAGNOSIS — Z0000 Encounter for general adult medical examination without abnormal findings: Secondary | ICD-10-CM | POA: Diagnosis not present

## 2023-11-11 DIAGNOSIS — E7800 Pure hypercholesterolemia, unspecified: Secondary | ICD-10-CM

## 2023-11-11 MED ORDER — FLUOXETINE HCL 40 MG PO CAPS: 40.0000 mg | ORAL_CAPSULE | Freq: Every day | ORAL | 3 refills | Status: AC

## 2023-11-11 MED ORDER — BUPROPION HCL 75 MG PO TABS: 75.0000 mg | ORAL_TABLET | Freq: Every morning | ORAL | 3 refills | Status: AC

## 2023-11-11 MED ORDER — TRIAMTERENE-HCTZ 37.5-25 MG PO TABS: 1.0000 | ORAL_TABLET | Freq: Every day | ORAL | 1 refills | Status: AC

## 2023-11-11 NOTE — Progress Notes (Signed)
 Subjective:    Patient ID: Jordan Benson, female    DOB: 1955/11/24, 68 y.o.   MRN: [id number]  Patient here for  Chief Complaint  Patient presents with   Annual Exam    HPI Here for a physical exam. Had f/u with pulmonary 09/02/23 - f/u sleep apnea. Discussed changing max pressure and mask adjustment. Has had issues with urinary incontinence. Has tried kegels. Oxybutynin . Continues on prozac  and wellbutrin . Followed by oncology - atypical ductal hyperplasia of left breast - arimidex  - until 02/2024. Has not had recent f/u. Discussed today. Agreeable. Stays active. No chest pain or sob reported. No cough or congestion. No abdominal pain or bowel change reported. Fiber - no loose stool. Urinary symptoms better. No a significant issue for her now.    Past Medical History:  Diagnosis Date   Anxiety    Breast cancer (HCC) 12/2017   hyperplasia only not cancer per pt   Complication of anesthesia    nausea   GERD (gastroesophageal reflux disease)    Headache    optical migraines   Hyperlipidemia    Hypertension    IBS (irritable bowel syndrome)    Melanoma (HCC) 2019   followed by Dr Dela, on left arm   PONV (postoperative nausea and vomiting)    Vitamin D  deficiency    Past Surgical History:  Procedure Laterality Date   ABDOMINAL HYSTERECTOMY  1992   BREAST BIOPSY Left 12/21/2017   affirm bx of asymmetry, ATYPICAL DUCTAL PAPILLARY LESION WITH ADJACENT ATYPICAL DUCTAL HYPERPLASIA.    BREAST BIOPSY Left 12/21/2017   us  bx of LN, benign   BREAST LUMPECTOMY Left 01/05/2018   ADH   BREAST LUMPECTOMY Left 01/12/2018   neg   BREAST LUMPECTOMY WITH NEEDLE LOCALIZATION Left 01/05/2018   Procedure: BREAST LUMPECTOMY WITH NEEDLE LOCALIZATION;  Surgeon: Desiderio Schanz, MD;  Location: [facility];  Service: General;  Laterality: Left;   BREAST SURGERY     COLONOSCOPY  2001, 2016   x2 ARM and at [facility]   COLONOSCOPY WITH PROPOFOL  N/A 12/22/2022   Procedure: COLONOSCOPY WITH PROPOFOL ;   Surgeon: Unk Corinn Skiff, MD;  Location: [facility];  Service: Endoscopy;  Laterality: N/A;   JOINT REPLACEMENT Right 10/2017   TKR   MOHS SURGERY  2012   OVARIAN CYST REMOVAL  1991   POLYPECTOMY     POLYPECTOMY  12/22/2022   Procedure: POLYPECTOMY;  Surgeon: Unk Corinn Skiff, MD;  Location: [facility];  Service: Endoscopy;;   RE-EXCISION OF BREAST LUMPECTOMY Left 01/12/2018   Procedure: RE-EXCISION OF BREAST LUMPECTOMY;  Surgeon: Desiderio Schanz, MD;  Location: [facility];  Service: General;  Laterality: Left;   SEPTOPLASTY  2009   TOTAL KNEE 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   Procedure: RIGHT TOTAL KNEE ARTHROPLASTY;  Surgeon: Gerome Charleston, MD;  Location: [facility];  Service: Orthopedics;  Laterality: Right;  2 hrs   TUBAL LIGATION  1985   tummy tuck  1995   WISDOM TOOTH EXTRACTION     Family History  Problem Relation Age of Onset   Cancer Mother 76       breast cancer   Heart disease Mother    Hypertension Mother    Diabetes Mother    Breast cancer Mother 72   Heart disease Father    Depression Sister    Breast cancer Sister 38       on HRT   Colon polyps Sister    Alcohol abuse Maternal Grandfather    Alcohol  abuse Paternal Grandfather    Alcohol abuse Paternal Grandmother    Anxiety disorder Daughter    Colon cancer Neg Hx    Esophageal cancer Neg Hx    Rectal cancer Neg Hx    Stomach cancer Neg Hx    Social History   Socioeconomic History   Marital status: Widowed    Spouse name: Zachary   Number of children: 2   Years of education: Not on file   Highest education level: Associate degree: occupational, Scientist, product/process development, or vocational program  Occupational History    Employer: [employer]  Tobacco Use   Smoking status: Former    Current packs/day: 0.00    Average packs/day: 0.5 packs/day for 10.0 years (5.0 ttl pk-yrs)    Types: Cigarettes    Start date: 03/12/1990    Quit date: 03/12/2000    Years since quitting: 23.6   Smokeless tobacco: Never  Vaping Use    Vaping status: Never Used  Substance and Sexual Activity   Alcohol use: Not Currently    Comment: wine daily- moderate use   Drug use: Never    Comment: gummies   Sexual activity: Not Currently  Other Topics Concern   Not on file  Social History Narrative   Widow   Retired from [employer]   Social Drivers of Health   Financial Resource Strain: Low Risk  (01/27/2023)   Overall Financial Resource Strain (CARDIA)    Difficulty of Paying Living Expenses: Not hard at all  Food Insecurity: No Food Insecurity (01/27/2023)   Hunger Vital Sign    Worried About Running Out of Food in the Last Year: Never true    Ran Out of Food in the Last Year: Never true  Transportation Needs: No Transportation Needs (01/27/2023)   PRAPARE - Administrator, Civil Service (Medical): No    Lack of Transportation (Non-Medical): No  Physical Activity: Inactive (01/27/2023)   Exercise Vital Sign    Days of Exercise per Week: 0 days    Minutes of Exercise per Session: 40 min  Stress: No Stress Concern Present (01/27/2023)   [employer] of Occupational Health - Occupational Stress Questionnaire    Feeling of Stress : Only a little  Recent Concern: Stress - Stress Concern Present (11/10/2022)   [employer] of Occupational Health - Occupational Stress Questionnaire    Feeling of Stress : To some extent  Social Connections: Moderately Isolated (01/27/2023)   Social Connection and Isolation Panel    Frequency of Communication with Friends and Family: More than three times a week    Frequency of Social Gatherings with Friends and Family: More than three times a week    Attends Religious Services: 1 to 4 times per year    Active Member of [employer] or Organizations: No    Attends Banker Meetings: Never    Marital Status: Widowed     Review of Systems  Constitutional:  Negative for appetite change and unexpected weight change.  HENT:  Negative for congestion, sinus  pressure and sore throat.   Eyes:  Negative for pain and visual disturbance.  Respiratory:  Negative for cough, chest tightness and shortness of breath.   Cardiovascular:  Negative for chest pain, palpitations and leg swelling.  Gastrointestinal:  Negative for abdominal pain, diarrhea, nausea and vomiting.  Genitourinary:  Negative for difficulty urinating and dysuria.  Musculoskeletal:  Negative for joint swelling and myalgias.  Skin:  Negative for color change and rash.  Neurological:  Negative for dizziness and headaches.  Hematological:  Negative for adenopathy. Does not bruise/bleed easily.  Psychiatric/Behavioral:  Negative for agitation and dysphoric mood.        Objective:     BP 120/68   Pulse 78   Resp 16   Ht 5' 8 (1.727 m)   Wt 190 lb 12.8 oz (86.5 kg)   SpO2 98%   BMI 29.01 kg/m  Wt Readings from Last 3 Encounters:  11/11/23 190 lb 12.8 oz (86.5 kg)  09/07/23 195 lb (88.5 kg)  09/02/23 195 lb (88.5 kg)    Physical Exam Vitals reviewed.  Constitutional:      General: She is not in acute distress.    Appearance: Normal appearance. She is well-developed.  HENT:     Head: Normocephalic and atraumatic.     Right Ear: External ear normal.     Left Ear: External ear normal.     Mouth/Throat:     Pharynx: No oropharyngeal exudate or posterior oropharyngeal erythema.  Eyes:     General: No scleral icterus.       Right eye: No discharge.        Left eye: No discharge.     Conjunctiva/sclera: Conjunctivae normal.  Neck:     Thyroid : No thyromegaly.  Cardiovascular:     Rate and Rhythm: Normal rate and regular rhythm.  Pulmonary:     Effort: No tachypnea, accessory muscle usage or respiratory distress.     Breath sounds: Normal breath sounds. No decreased breath sounds or wheezing.  Chest:  Breasts:    Right: No inverted nipple, mass, nipple discharge or tenderness (no axillary adenopathy).     Left: No inverted nipple, mass, nipple discharge or tenderness  (no axilarry adenopathy).  Abdominal:     General: Bowel sounds are normal.     Palpations: Abdomen is soft.     Tenderness: There is no abdominal tenderness.  Musculoskeletal:        General: No swelling or tenderness.     Cervical back: Neck supple.  Lymphadenopathy:     Cervical: No cervical adenopathy.  Skin:    Findings: No erythema or rash.  Neurological:     Mental Status: She is alert and oriented to person, place, and time.  Psychiatric:        Mood and Affect: Mood normal.        Behavior: Behavior normal.         Outpatient Encounter Medications as of 11/11/2023  Medication Sig   amLODipine  (NORVASC ) 10 MG tablet TAKE 1 TABLET EVERY DAY   anastrozole  (ARIMIDEX ) 1 MG tablet TAKE 1 TABLET EVERY DAY   buPROPion  (WELLBUTRIN ) 75 MG tablet Take 1 tablet (75 mg total) by mouth every morning.   CALCIUM -VITAMIN D  PO Take by mouth.   enalapril  (VASOTEC ) 10 MG tablet TAKE 1 TABLET TWICE DAILY   estradiol (ESTRACE) 0.1 MG/GM vaginal cream Place 1 Applicatorful vaginally daily.   FLUoxetine  (PROZAC ) 40 MG capsule Take 1 capsule (40 mg total) by mouth daily.   Multiple Vitamins-Calcium  (ONE-A-DAY WOMENS PO) Take 1 tablet by mouth daily.   oxybutynin  (DITROPAN -XL) 10 MG 24 hr tablet Take 1 tablet (10 mg total) by mouth at bedtime.   rosuvastatin  (CRESTOR ) 10 MG tablet TAKE 1 TABLET EVERY DAY   triamterene -hydrochlorothiazide  (MAXZIDE -25) 37.5-25 MG tablet Take 1 tablet by mouth daily.   [DISCONTINUED] buPROPion  (WELLBUTRIN ) 75 MG tablet TAKE 1 TABLET EVERY MORNING   [DISCONTINUED] FLUoxetine  (PROZAC ) 40 MG capsule TAKE 1  CAPSULE EVERY DAY   [DISCONTINUED] triamterene -hydrochlorothiazide  (MAXZIDE -25) 37.5-25 MG tablet TAKE 1 TABLET EVERY DAY   No facility-administered encounter medications on file as of 11/11/2023.     Lab Results  Component Value Date   WBC 7.9 11/09/2023   HGB 13.3 11/09/2023   HCT 39.4 11/09/2023   PLT 389.0 11/09/2023   GLUCOSE 90 11/09/2023   CHOL 135  11/09/2023   TRIG 126.0 11/09/2023   HDL 54.70 11/09/2023   LDLDIRECT 138.0 03/20/2019   LDLCALC 55 11/09/2023   ALT 10 11/09/2023   AST 14 11/09/2023   NA 139 11/09/2023   K 4.0 11/09/2023   CL 102 11/09/2023   CREATININE 0.93 11/09/2023   BUN 18 11/09/2023   CO2 27 11/09/2023   TSH 2.24 11/09/2023   INR 0.94 08/13/2017   HGBA1C 5.5 11/09/2023    MM 3D SCREENING MAMMOGRAM BILATERAL BREAST Result Date: 04/24/2023 CLINICAL DATA:  Screening. EXAM: DIGITAL SCREENING BILATERAL MAMMOGRAM WITH TOMOSYNTHESIS AND CAD TECHNIQUE: Bilateral screening digital craniocaudal and mediolateral oblique mammograms were obtained. Bilateral screening digital breast tomosynthesis was performed. The images were evaluated with computer-aided detection. COMPARISON:  Previous exam(s). ACR Breast Density Category b: There are scattered areas of fibroglandular density. FINDINGS: There are no findings suspicious for malignancy. IMPRESSION: No mammographic evidence of malignancy. A result letter of this screening mammogram will be mailed directly to the patient. RECOMMENDATION: Screening mammogram in one year. (Code:SM-B-01Y) BI-RADS CATEGORY  1: Negative. Electronically Signed   By: Rosaline Collet M.D.   On: 04/24/2023 09:55       Assessment & Plan:  Routine general medical examination at a health care facility  Hypercholesterolemia Assessment & Plan: On crestor .  Low cholesterol diet and exercise.  Follow lipid panel and liver function tests.   Lab Results  Component Value Date   CHOL 135 11/09/2023   HDL 54.70 11/09/2023   LDLCALC 55 11/09/2023   LDLDIRECT 138.0 03/20/2019   TRIG 126.0 11/09/2023   CHOLHDL 2 11/09/2023    Orders: -     Lipid panel; Future -     Hepatic function panel; Future  Hyperglycemia Assessment & Plan: Low carb diet and exercise.  Follow met b and a1c.   Lab Results  Component Value Date   HGBA1C 5.5 11/09/2023    Orders: -     Hemoglobin A1c; Future  Essential  hypertension, benign Assessment & Plan: Continue on amlodipine  and triam/hydrochlorothiazide . Follow pressures. No changes today.   Orders: -     Basic metabolic panel with GFR; Future  Health care maintenance Assessment & Plan: Physical today 11/11/23. Mammogram 04/21/23 - Birads I. Colonoscopy 12/22/22 - two tubular adenomatous polyps. F/u 3 years.    Stress incontinence Assessment & Plan: Improved with bowel regulation. No vaginal symptoms. Follow.    Anxiety Assessment & Plan: Continues on prozac  and wellbutrin .  Stable. Follow.    Atypical ductal hyperplasia of left breast Assessment & Plan: Left breast atypical ductal hyperplasia [January 2021-Arimidex  ]currently on antihormone therapy with armidex 1mg /day. MARCH 2024- Mammo-WNL.   Tolerating well without any major side effects. No mammographic evidence of malignancy.  Will need to be on AI for total of 5 years until January 2026. Overdue f/u with Dr Rennie. Agreeable for f/u.   Orders: -     Ambulatory referral to Hematology / Oncology  MDD (major depressive disorder), recurrent episode, mild Assessment & Plan: Continue prozac  and wellbutrin . Overall doing well. Follow.    Other orders -  buPROPion  HCl; Take 1 tablet (75 mg total) by mouth every morning.  Dispense: 90 tablet; Refill: 3 -     FLUoxetine  HCl; Take 1 capsule (40 mg total) by mouth daily.  Dispense: 90 capsule; Refill: 3 -     Triamterene -HCTZ; Take 1 tablet by mouth daily.  Dispense: 90 tablet; Refill: 1     Allena Hamilton, MD

## 2023-11-11 NOTE — Assessment & Plan Note (Signed)
 Physical today 11/11/23. Mammogram 04/21/23 - Birads I. Colonoscopy 12/22/22 - two tubular adenomatous polyps. F/u 3 years.

## 2023-11-14 ENCOUNTER — Encounter: Payer: Self-pay | Admitting: Internal Medicine

## 2023-11-14 NOTE — Assessment & Plan Note (Signed)
 Low carb diet and exercise.  Follow met b and a1c.   Lab Results  Component Value Date   HGBA1C 5.5 11/09/2023

## 2023-11-14 NOTE — Assessment & Plan Note (Signed)
 Improved with bowel regulation. No vaginal symptoms. Follow.

## 2023-11-14 NOTE — Assessment & Plan Note (Signed)
 Left breast atypical ductal hyperplasia [January 2021-Arimidex  ]currently on antihormone therapy with armidex 1mg /day. MARCH 2024- Mammo-WNL.   Tolerating well without any major side effects. No mammographic evidence of malignancy.  Will need to be on AI for total of 5 years until January 2026. Overdue f/u with Dr Rennie. Agreeable for f/u.

## 2023-11-14 NOTE — Assessment & Plan Note (Signed)
 On crestor .  Low cholesterol diet and exercise.  Follow lipid panel and liver function tests.   Lab Results  Component Value Date   CHOL 135 11/09/2023   HDL 54.70 11/09/2023   LDLCALC 55 11/09/2023   LDLDIRECT 138.0 03/20/2019   TRIG 126.0 11/09/2023   CHOLHDL 2 11/09/2023

## 2023-11-14 NOTE — Assessment & Plan Note (Signed)
 Continue on amlodipine  and triam/hydrochlorothiazide . Follow pressures. No changes today.

## 2023-11-14 NOTE — Assessment & Plan Note (Addendum)
Continues on prozac and wellbutrin.  Stable. Follow.

## 2023-11-14 NOTE — Assessment & Plan Note (Signed)
 Continue prozac  and wellbutrin . Overall doing well. Follow.

## 2023-11-15 ENCOUNTER — Ambulatory Visit: Payer: Medicare PPO | Admitting: *Deleted

## 2023-11-15 VITALS — Ht 68.5 in | Wt 193.0 lb

## 2023-11-15 DIAGNOSIS — Z1231 Encounter for screening mammogram for malignant neoplasm of breast: Secondary | ICD-10-CM | POA: Diagnosis not present

## 2023-11-15 DIAGNOSIS — Z0000 Encounter for general adult medical examination without abnormal findings: Secondary | ICD-10-CM | POA: Diagnosis not present

## 2023-11-15 NOTE — Progress Notes (Signed)
 Subjective:   [name] is a 68 y.o. who presents for a Medicare Wellness preventive visit.  As a reminder, Annual Wellness Visits don't include a physical exam, and some assessments may be limited, especially if this visit is performed virtually. We may recommend an in-person follow-up visit with your provider if needed.  Visit Complete: Virtual I connected with  Jordan Benson on 11/15/23 by a audio enabled telemedicine application and verified that I am speaking with the correct person using two identifiers.  Patient Location: Home  Provider Location: Home Office  I discussed the limitations of evaluation and management by telemedicine. The patient expressed understanding and agreed to proceed.  Vital Signs: Because this visit was a virtual/telehealth visit, some criteria may be missing or patient reported. Any vitals not documented were not able to be obtained and vitals that have been documented are patient reported.  VideoDeclined- This patient declined Librarian, academic. Therefore the visit was completed with audio only.  Persons Participating in Visit: Patient.  AWV Questionnaire: Yes: Patient Medicare AWV questionnaire was completed by the patient on 11/15/23; I have confirmed that all information answered by patient is correct and no changes since this date.  Cardiac Risk Factors include: advanced age (>47men, >98 women);dyslipidemia;hypertension     Objective:    Today's Vitals   11/15/23 0925  Weight: 193 lb (87.5 kg)  Height: 5' 8.5 (1.74 m)   Body mass index is 28.92 kg/m.     11/15/2023    9:38 AM 12/22/2022    7:44 AM 11/10/2022   11:30 AM 03/27/2022    2:41 PM 09/18/2021    4:42 PM 08/05/2021    9:11 AM 03/27/2021   10:37 AM  Advanced Directives  Does Patient Have a Medical Advance Directive? Yes Yes Yes Yes No Yes Yes  Type of Estate agent of [place];Living will Living will Healthcare Power of  [place];Living will Living will;Healthcare Power of [employer] Power of [place];Living will Healthcare Power of [place];Living will  Does patient want to make changes to medical advance directive?  No - Patient declined    No - Patient declined   Copy of Healthcare Power of Attorney in Chart? No - copy requested  No - copy requested   No - copy requested     Current Medications (verified) Outpatient Encounter Medications as of 11/15/2023  Medication Sig   amLODipine  (NORVASC ) 10 MG tablet TAKE 1 TABLET EVERY DAY   anastrozole  (ARIMIDEX ) 1 MG tablet TAKE 1 TABLET EVERY DAY   buPROPion  (WELLBUTRIN ) 75 MG tablet Take 1 tablet (75 mg total) by mouth every morning.   CALCIUM -VITAMIN D  PO Take by mouth.   cholecalciferol (VITAMIN D3) 25 MCG (1000 UNIT) tablet Take 1,000 Units by mouth daily.   enalapril  (VASOTEC ) 10 MG tablet TAKE 1 TABLET TWICE DAILY   estradiol (ESTRACE) 0.1 MG/GM vaginal cream Place 1 Applicatorful vaginally daily.   FLUoxetine  (PROZAC ) 40 MG capsule Take 1 capsule (40 mg total) by mouth daily.   Multiple Vitamins-Calcium  (ONE-A-DAY WOMENS PO) Take 1 tablet by mouth daily.   oxybutynin  (DITROPAN -XL) 10 MG 24 hr tablet Take 1 tablet (10 mg total) by mouth at bedtime.   rosuvastatin  (CRESTOR ) 10 MG tablet TAKE 1 TABLET EVERY DAY   triamterene -hydrochlorothiazide  (MAXZIDE -25) 37.5-25 MG tablet Take 1 tablet by mouth daily.   No facility-administered encounter medications on file as of 11/15/2023.    Allergies (verified) Patient has no known allergies.   History:  Past Medical History:  Diagnosis Date   Anxiety    Breast cancer (HCC) 12/2017   hyperplasia only not cancer per pt   Complication of anesthesia    nausea   GERD (gastroesophageal reflux disease)    Headache    optical migraines   Hyperlipidemia    Hypertension    IBS (irritable bowel syndrome)    Melanoma (HCC) 2019   followed by Dr Dela, on left arm   PONV (postoperative nausea and  vomiting)    Vitamin D  deficiency    Past Surgical History:  Procedure Laterality Date   ABDOMINAL HYSTERECTOMY  1992   BREAST BIOPSY Left 12/21/2017   affirm bx of asymmetry, ATYPICAL DUCTAL PAPILLARY LESION WITH ADJACENT ATYPICAL DUCTAL HYPERPLASIA.    BREAST BIOPSY Left 12/21/2017   us  bx of LN, benign   BREAST LUMPECTOMY Left 01/05/2018   ADH   BREAST LUMPECTOMY Left 01/12/2018   neg   BREAST LUMPECTOMY WITH NEEDLE LOCALIZATION Left 01/05/2018   Procedure: BREAST LUMPECTOMY WITH NEEDLE LOCALIZATION;  Surgeon: Desiderio Schanz, MD;  Location: [facility] ORS;  Service: General;  Laterality: Left;   BREAST SURGERY     COLONOSCOPY  2001, 2016   x2 ARM and at [facility]   COLONOSCOPY WITH PROPOFOL  N/A 12/22/2022   Procedure: COLONOSCOPY WITH PROPOFOL ;  Surgeon: Unk Corinn Skiff, MD;  Location: [facility];  Service: Endoscopy;  Laterality: N/A;   JOINT REPLACEMENT Right 10/2017   TKR   MOHS SURGERY  2012   OVARIAN CYST REMOVAL  1991   POLYPECTOMY     POLYPECTOMY  12/22/2022   Procedure: POLYPECTOMY;  Surgeon: Unk Corinn Skiff, MD;  Location: [facility];  Service: Endoscopy;;   RE-EXCISION OF BREAST LUMPECTOMY Left 01/12/2018   Procedure: RE-EXCISION OF BREAST LUMPECTOMY;  Surgeon: Desiderio Schanz, MD;  Location: [facility] ORS;  Service: General;  Laterality: Left;   SEPTOPLASTY  2009   TOTAL KNEE 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   Procedure: RIGHT TOTAL KNEE ARTHROPLASTY;  Surgeon: Gerome Charleston, MD;  Location: [facility];  Service: Orthopedics;  Laterality: Right;  2 hrs   TUBAL LIGATION  1985   tummy tuck  1995   WISDOM TOOTH EXTRACTION     Family History  Problem Relation Age of Onset   Cancer Mother 13       breast cancer   Heart disease Mother    Hypertension Mother    Diabetes Mother    Breast cancer Mother 60   Heart disease Father    Depression Sister    Breast cancer Sister 83       on HRT   Colon polyps Sister    Alcohol abuse Maternal Grandfather    Alcohol  abuse Paternal Grandfather    Alcohol abuse Paternal Grandmother    Anxiety disorder Daughter    Colon cancer Neg Hx    Esophageal cancer Neg Hx    Rectal cancer Neg Hx    Stomach cancer Neg Hx    Social History   Socioeconomic History   Marital status: Widowed    Spouse name: Zachary   Number of children: 2   Years of education: Not on file   Highest education level: Associate degree: occupational, Scientist, product/process development, or vocational program  Occupational History    Employer: [employer]  Tobacco Use   Smoking status: Former    Current packs/day: 0.00    Average packs/day: 0.5 packs/day for 10.0 years (5.0 ttl pk-yrs)    Types: Cigarettes    Start date: 03/12/1990  Quit date: 03/12/2000    Years since quitting: 23.6   Smokeless tobacco: Never  Vaping Use   Vaping status: Never Used  Substance and Sexual Activity   Alcohol use: Not Currently    Comment: wine daily- moderate use   Drug use: Never    Comment: gummies   Sexual activity: Not Currently  Other Topics Concern   Not on file  Social History Narrative   Widow   Retired from [employer]   Social Drivers of Health   Financial Resource Strain: Low Risk  (11/15/2023)   Overall Financial Resource Strain (CARDIA)    Difficulty of Paying Living Expenses: Not hard at all  Food Insecurity: No Food Insecurity (11/15/2023)   Hunger Vital Sign    Worried About Running Out of Food in the Last Year: Never true    Ran Out of Food in the Last Year: Never true  Transportation Needs: No Transportation Needs (11/15/2023)   PRAPARE - Administrator, Civil Service (Medical): No    Lack of Transportation (Non-Medical): No  Physical Activity: Insufficiently Active (11/15/2023)   Exercise Vital Sign    Days of Exercise per Week: 1 day    Minutes of Exercise per Session: 20 min  Stress: Stress Concern Present (11/15/2023)   [employer] of Occupational Health - Occupational Stress Questionnaire    Feeling of Stress: To some extent   Social Connections: Moderately Isolated (11/15/2023)   Social Connection and Isolation Panel    Frequency of Communication with Friends and Family: More than three times a week    Frequency of Social Gatherings with Friends and Family: More than three times a week    Attends Religious Services: 1 to 4 times per year    Active Member of [employer] or Organizations: No    Attends Banker Meetings: Never    Marital Status: Widowed    Tobacco Counseling Counseling given: Not Answered    Clinical Intake:  Pre-visit preparation completed: Yes  Pain : No/denies pain     BMI - recorded: 28.92 Nutritional Status: BMI 25 -29 Overweight Nutritional Risks: None Diabetes: No  Lab Results  Component Value Date   HGBA1C 5.5 11/09/2023   HGBA1C 5.6 01/27/2023   HGBA1C 5.4 07/22/2022     How often do you need to have someone help you when you read instructions, pamphlets, or other written materials from your doctor or pharmacy?: 1 - Never  Interpreter Needed?: No  Information entered by :: R. Lois Slagel LPN   Activities of Daily Living     11/15/2023    7:43 AM 12/22/2022    7:45 AM  In your present state of health, do you have any difficulty performing the following activities:  Hearing? 0 0  Vision? 0 0  Difficulty concentrating or making decisions? 0 0  Walking or climbing stairs? 0   Dressing or bathing? 0   Doing errands, shopping? 0   Preparing Food and eating ? N   Using the Toilet? N   In the past six months, have you accidently leaked urine? Y   Do you have problems with loss of bowel control? N   Managing your Medications? N   Managing your Finances? N   Housekeeping or managing your Housekeeping? N     Patient Care Team: Glendia Shad, MD as PCP - General (Internal Medicine) Rennie Cindy SAUNDERS, MD as Consulting Physician (Internal Medicine) Reddy, Pallavi D, MD as Consulting Physician (Sleep Medicine) Dasher, Alm  A, MD (Dermatology)  I have  updated your Care Teams any recent Medical Services you may have received from other providers in the past year.     Assessment:   This is a routine wellness examination for Jordan Benson.  Hearing/Vision screen Hearing Screening - Comments:: No issues Vision Screening - Comments:: glasses   Goals Addressed             This Visit's Progress    Patient Stated       Needs more of a regular exercise program       Depression Screen     11/15/2023    9:31 AM 01/27/2023   10:10 AM 11/10/2022   11:25 AM 10/23/2022    8:12 AM 02/19/2022    8:11 AM 10/23/2021   11:37 AM 10/21/2021    4:43 PM  PHQ 2/9 Scores  PHQ - 2 Score 0 0 1 0 0 0   PHQ- 9 Score 0 0 1 0 0       Information is confidential and restricted. Go to Review Flowsheets to unlock data.    Fall Risk     11/15/2023    7:43 AM 01/27/2023   10:10 AM 11/10/2022   11:23 AM 10/23/2022    8:12 AM 02/19/2022    8:11 AM  Fall Risk   Falls in the past year? 0 0 0 0 0  Number falls in past yr: 0 0 0 0 0  Injury with Fall? 0 0 0 0 0  Risk for fall due to : No Fall Risks No Fall Risks No Fall Risks No Fall Risks No Fall Risks  Follow up Falls evaluation completed;Falls prevention discussed Falls evaluation completed Falls prevention discussed;Falls evaluation completed Falls evaluation completed Falls evaluation completed      Data saved with a previous flowsheet row definition    MEDICARE RISK AT HOME:  Medicare Risk at Home Any stairs in or around the home?: (Patient-Rptd) Yes If so, are there any without handrails?: (Patient-Rptd) No Home free of loose throw rugs in walkways, pet beds, electrical cords, etc?: (Patient-Rptd) Yes Adequate lighting in your home to reduce risk of falls?: (Patient-Rptd) Yes Life alert?: (Patient-Rptd) No Use of a cane, walker or w/c?: (Patient-Rptd) No Grab bars in the bathroom?: (Patient-Rptd) No Shower chair or bench in shower?: (Patient-Rptd) No Elevated toilet seat or a handicapped toilet?:  (Patient-Rptd) No  TIMED UP AND GO:  Was the test performed?  No  Cognitive Function: 6CIT completed        11/15/2023    9:38 AM 11/10/2022   11:30 AM  6CIT Screen  What Year? 0 points 0 points  What month? 0 points 0 points  What time? 0 points 0 points  Count back from 20 0 points 0 points  Months in reverse 0 points 0 points  Repeat phrase 0 points 0 points  Total Score 0 points 0 points    Immunizations Immunization History  Administered Date(s) Administered    sv, Bivalent, Protein Subunit Rsvpref,pf [place]) 11/06/2021   Fluad Quad(high Dose 65+) 10/23/2021   Fluad Trivalent(High Dose 65+) 11/10/2022   Influenza,inj,Quad PF,6+ Mos 10/17/2014, 10/06/2016, 11/05/2018, 11/13/2020   Influenza-Unspecified 11/09/2013, 11/30/2017, 10/27/2019, 10/20/2023   Moderna Covid-19 Fall Seasonal Vaccine 38yrs & older 11/06/2021, 10/20/2023   Moderna Sars-Covid-2 Vaccination 03/11/2019, 04/08/2019, 12/06/2019, 05/13/2020   PFIZER Comirnaty(Gray Top)Covid-19 Tri-Sucrose Vaccine 10/25/2020   Pfizer(Comirnaty)Fall Seasonal Vaccine 12 years and older 08/18/2022, 12/27/2022   Pneumococcal Conjugate-13 08/01/2019   Rabies, IM 09/04/2021,  09/07/2021, 09/11/2021, 09/18/2021   Tdap 01/01/2016   Zoster Recombinant(Shingrix) 08/05/2020, 10/05/2020    Screening Tests Health Maintenance  Topic Date Due   Medicare Annual Wellness (AWV)  11/10/2023   Pneumococcal Vaccine: 50+ Years (2 of 2 - PCV20 or PCV21) 11/10/2024 (Originally 07/31/2020)   COVID-19 Vaccine (10 - 2025-26 season) 12/15/2023   Mammogram  04/20/2024   Colonoscopy  12/21/2025   DTaP/Tdap/Td (2 - Td or Tdap) 12/31/2025   Influenza Vaccine  Completed   DEXA SCAN  Completed   Zoster Vaccines- Shingrix  Completed   Meningococcal B Vaccine  Aged Out   Hepatitis C Screening  Discontinued    Health Maintenance Items Addressed: Mammogram ordered  Additional Screening:  Vision Screening: Recommended annual ophthalmology exams  for early detection of glaucoma and other disorders of the eye. Is the patient up to date with their annual eye exam?  Yes  Who is the provider or what is the name of the office in which the patient attends annual eye exams?  [name]  Dental Screening: Recommended annual dental exams for proper oral hygiene  Community Resource Referral / Chronic Care Management: CRR required this visit?  No   CCM required this visit?  No   Plan:    I have personally reviewed and noted the following in the patient's chart:   Medical and social history Use of alcohol, tobacco or illicit drugs  Current medications and supplements including opioid prescriptions. Patient is not currently taking opioid prescriptions. Functional ability and status Nutritional status Physical activity Advanced directives List of other physicians Hospitalizations, surgeries, and ER visits in previous 12 months Vitals Screenings to include cognitive, depression, and falls Referrals and appointments  In addition, I have reviewed and discussed with patient certain preventive protocols, quality metrics, and best practice recommendations. A written personalized care plan for preventive services as well as general preventive health recommendations were provided to patient.   Angeline Fredericks, LPN   [id number]/04/[id number]   After Visit Summary: (MyChart) Due to this being a telephonic visit, the after visit summary with patients personalized plan was offered to patient via MyChart   Notes: Nothing significant to report at this time.

## 2023-11-15 NOTE — Patient Instructions (Signed)
 Ms. Bin,  Thank you for taking the time for your Medicare Wellness Visit. I appreciate your continued commitment to your health goals. Please review the care plan we discussed, and feel free to reach out if I can assist you further.  Medicare recommends these wellness visits once per year to help you and your care team stay ahead of potential health issues. These visits are designed to focus on prevention, allowing your provider to concentrate on managing your acute and chronic conditions during your regular appointments.  Please note that Annual Wellness Visits do not include a physical exam. Some assessments may be limited, especially if the visit was conducted virtually. If needed, we may recommend a separate in-person follow-up with your provider.  Ongoing Care Seeing your primary care provider every 3 to 6 months helps us  monitor your health and provide consistent, personalized care.   You have an order for:  []   2D Mammogram  [x]   3D Mammogram  []   Bone Density     Please call for appointment:   [facility] [address]. [place], [place] [address] [phone]    Make sure to wear two-piece clothing.  No lotions, powders, or deodorants the day of the appointment. Make sure to bring picture ID and insurance card.  Bring list of medications you are currently taking including any supplements.    Referrals If a referral was made during today's visit and you haven't received any updates within two weeks, please contact the referred provider directly to check on the status.  Recommended Screenings:  Health Maintenance  Topic Date Due   Pneumococcal Vaccine for age over 1 (2 of 2 - PCV20 or PCV21) 11/10/2024*   COVID-19 Vaccine (10 - 2025-26 season) 12/15/2023   Breast Cancer Screening  04/20/2024   Medicare Annual Wellness Visit  11/14/2024   Colon Cancer Screening  12/21/2025   DTaP/Tdap/Td vaccine (2 - Td or Tdap) 12/31/2025   Flu  Shot  Completed   DEXA scan (bone density measurement)  Completed   Zoster (Shingles) Vaccine  Completed   Meningitis B Vaccine  Aged Out   Hepatitis C Screening  Discontinued  *Topic was postponed. The date shown is not the original due date.       11/15/2023    9:38 AM  Advanced Directives  Does Patient Have a Medical Advance Directive? Yes  Type of Estate agent of [place];Living will  Copy of Healthcare Power of Attorney in Chart? No - copy requested   Advance Care Planning is important because it: Ensures you receive medical care that aligns with your values, goals, and preferences. Provides guidance to your family and loved ones, reducing the emotional burden of decision-making during critical moments.  Vision: Annual vision screenings are recommended for early detection of glaucoma, cataracts, and diabetic retinopathy. These exams can also reveal signs of chronic conditions such as diabetes and high blood pressure.  Dental: Annual dental screenings help detect early signs of oral cancer, gum disease, and other conditions linked to overall health, including heart disease and diabetes.  Please see the attached documents for additional preventive care recommendations.

## 2023-11-25 ENCOUNTER — Telehealth: Payer: Self-pay

## 2023-11-25 NOTE — Telephone Encounter (Signed)
 Noted, NFN

## 2023-11-25 NOTE — Telephone Encounter (Signed)
 Copied from CRM [id number]. Topic: Clinical - Order For Equipment >> Nov 25, 2023  9:40 AM Russell PARAS wrote: Reason for CRM:   Rep with [facility], is contacting clinic regarding order sent to over for CPAP supplies. She faxed the request on 10/13 and today; however nothing shows in chart. Verified correct fax #  Requested call back with order status  # [phone]

## 2023-11-26 ENCOUNTER — Other Ambulatory Visit: Payer: Self-pay | Admitting: *Deleted

## 2023-11-26 DIAGNOSIS — N6092 Unspecified benign mammary dysplasia of left breast: Secondary | ICD-10-CM

## 2023-11-29 ENCOUNTER — Inpatient Hospital Stay

## 2023-11-29 ENCOUNTER — Encounter: Payer: Self-pay | Admitting: Oncology

## 2023-11-29 ENCOUNTER — Inpatient Hospital Stay: Attending: Oncology | Admitting: Oncology

## 2023-11-29 VITALS — BP 144/74 | HR 76 | Temp 98.6°F | Resp 19 | Wt 195.6 lb

## 2023-11-29 DIAGNOSIS — D225 Melanocytic nevi of trunk: Secondary | ICD-10-CM | POA: Diagnosis not present

## 2023-11-29 DIAGNOSIS — Z79811 Long term (current) use of aromatase inhibitors: Secondary | ICD-10-CM | POA: Insufficient documentation

## 2023-11-29 DIAGNOSIS — N6092 Unspecified benign mammary dysplasia of left breast: Secondary | ICD-10-CM

## 2023-11-29 DIAGNOSIS — D2262 Melanocytic nevi of left upper limb, including shoulder: Secondary | ICD-10-CM | POA: Diagnosis not present

## 2023-11-29 DIAGNOSIS — M8580 Other specified disorders of bone density and structure, unspecified site: Secondary | ICD-10-CM | POA: Insufficient documentation

## 2023-11-29 DIAGNOSIS — L570 Actinic keratosis: Secondary | ICD-10-CM | POA: Diagnosis not present

## 2023-11-29 DIAGNOSIS — Z85820 Personal history of malignant melanoma of skin: Secondary | ICD-10-CM | POA: Diagnosis not present

## 2023-11-29 DIAGNOSIS — Z85828 Personal history of other malignant neoplasm of skin: Secondary | ICD-10-CM | POA: Diagnosis not present

## 2023-11-29 DIAGNOSIS — D2261 Melanocytic nevi of right upper limb, including shoulder: Secondary | ICD-10-CM | POA: Diagnosis not present

## 2023-11-29 DIAGNOSIS — D2272 Melanocytic nevi of left lower limb, including hip: Secondary | ICD-10-CM | POA: Diagnosis not present

## 2023-11-29 DIAGNOSIS — M2550 Pain in unspecified joint: Secondary | ICD-10-CM | POA: Diagnosis not present

## 2023-11-29 LAB — CBC WITH DIFFERENTIAL/PLATELET
Abs Immature Granulocytes: 0.03 K/uL (ref 0.00–0.07)
Basophils Absolute: 0 K/uL (ref 0.0–0.1)
Basophils Relative: 1 %
Eosinophils Absolute: 0.2 K/uL (ref 0.0–0.5)
Eosinophils Relative: 3 %
HCT: 40.6 % (ref 36.0–46.0)
Hemoglobin: 14 g/dL (ref 12.0–15.0)
Immature Granulocytes: 1 %
Lymphocytes Relative: 27 %
Lymphs Abs: 1.7 K/uL (ref 0.7–4.0)
MCH: 30.6 pg (ref 26.0–34.0)
MCHC: 34.5 g/dL (ref 30.0–36.0)
MCV: 88.6 fL (ref 80.0–100.0)
Monocytes Absolute: 0.3 K/uL (ref 0.1–1.0)
Monocytes Relative: 5 %
Neutro Abs: 4.2 K/uL (ref 1.7–7.7)
Neutrophils Relative %: 63 %
Platelets: 289 K/uL (ref 150–400)
RBC: 4.58 MIL/uL (ref 3.87–5.11)
RDW: 11.9 % (ref 11.5–15.5)
WBC: 6.4 K/uL (ref 4.0–10.5)
nRBC: 0 % (ref 0.0–0.2)

## 2023-11-29 LAB — CMP (CANCER CENTER ONLY)
ALT: 14 U/L (ref 0–44)
AST: 27 U/L (ref 15–41)
Albumin: 3.9 g/dL (ref 3.5–5.0)
Alkaline Phosphatase: 59 U/L (ref 38–126)
Anion gap: 10 (ref 5–15)
BUN: 14 mg/dL (ref 8–23)
CO2: 23 mmol/L (ref 22–32)
Calcium: 9.1 mg/dL (ref 8.9–10.3)
Chloride: 103 mmol/L (ref 98–111)
Creatinine: 1.04 mg/dL — ABNORMAL HIGH (ref 0.44–1.00)
GFR, Estimated: 58 mL/min — ABNORMAL LOW (ref >60)
Glucose, Bld: 128 mg/dL — ABNORMAL HIGH (ref 70–99)
Potassium: 3.8 mmol/L (ref 3.5–5.1)
Sodium: 136 mmol/L (ref 135–145)
Total Bilirubin: 1.5 mg/dL — ABNORMAL HIGH (ref 0.0–1.2)
Total Protein: 7.1 g/dL (ref 6.5–8.1)

## 2023-11-29 LAB — VITAMIN D 25 HYDROXY (VIT D DEFICIENCY, FRACTURES): Vit D, 25-Hydroxy: 42.28 ng/mL (ref 30–100)

## 2023-11-29 NOTE — Assessment & Plan Note (Addendum)
 Bone density scan in March 2020 showed osteopenia T-score of -1.9. Most recent bone density from January 2024 showed T-score of -2.2. Previously they discussed bisphosphonate therapy but patient would like to hold off at this time. She will be completing antihormone therapy in the next couple of months. Reports she is taking calcium  and vitamin C and is very active.  She is not interested in starting any medication at this time

## 2023-11-29 NOTE — Progress Notes (Signed)
 [facility] OFFICE PROGRESS NOTE  Jordan Shad, MD  ASSESSMENT & PLAN:   Assessment & Plan Atypical ductal hyperplasia of left breast #Left breast atypical ductal hyperplasia [January 2021-Arimidex  ]currently on antihormone therapy with armidex 1mg /day. MARCH 2025- Mammo-WNL.   Tolerating well without any major side effects. No mammographic evidence of malignancy.  Discussed that she would need to be on AI for total of 5 years until January 2026. Repeat mammogram in 2026 March.  Orders placed.   Osteopenia, unspecified location Bone density scan in March 2020 showed osteopenia T-score of -1.9. Most recent bone density from January 2024 showed T-score of -2.2. Previously they discussed bisphosphonate therapy but patient would like to hold off at this time. She will be completing antihormone therapy in the next couple of months. Reports she is taking calcium  and vitamin C and is very active.  She is not interested in starting any medication at this time  Orders Placed This Encounter  Procedures   MM 3D SCREENING MAMMOGRAM BILATERAL BREAST    Standing Status:   Future    Expected Date:   05/29/2024    Expiration Date:   11/28/2024    Reason for Exam (SYMPTOM  OR DIAGNOSIS REQUIRED):   Breast cancer screening,    Preferred imaging location?:   [facility]   CBC with Differential (Cancer Center Only)    Standing Status:   Future    Expected Date:   06/12/2024    Expiration Date:   09/10/2024   CMP (Cancer Center only)    Standing Status:   Future    Expected Date:   06/12/2024    Expiration Date:   09/10/2024   VITAMIN D  25 Hydroxy (Vit-D Deficiency, Fractures)    Standing Status:   Future    Expected Date:   06/12/2024    Expiration Date:   09/10/2024    INTERVAL HISTORY: History of present illness:Jordan Benson 68 y.o.  female with history of atypical ductal hyperplasia on chemoprophylaxis with anastrozole  is here for follow-up.   Patient continues to be  anastrozole .  Reports she thinks she has completed 5 years of treatment already.   No major side effects.  Mild joint pains not any worse.  She is currently retired.  Patient is leaving to go to [place] for 3 weeks.  States she has 2 more weeks of anastrozole  and is going to stop.  Thinks she has been on anastrozole  for longer than 5 years.  SUMMARY OF HEMATOLOGIC HISTORY: Oncology History   No history exists.     CBC    Component Value Date/Time   WBC 6.4 11/29/2023 1121   RBC 4.58 11/29/2023 1121   HGB 14.0 11/29/2023 1121   HCT 40.6 11/29/2023 1121   PLT 289 11/29/2023 1121   MCV 88.6 11/29/2023 1121   MCH 30.6 11/29/2023 1121   MCHC 34.5 11/29/2023 1121   RDW 11.9 11/29/2023 1121   LYMPHSABS 1.7 11/29/2023 1121   MONOABS 0.3 11/29/2023 1121   EOSABS 0.2 11/29/2023 1121   BASOSABS 0.0 11/29/2023 1121       Latest Ref Rng & Units 11/29/2023   11:22 AM 11/09/2023    3:12 PM 01/27/2023   10:54 AM  CMP  Glucose 70 - 99 mg/dL 871  90  98   BUN 8 - 23 mg/dL 14  18  25    Creatinine 0.44 - 1.00 mg/dL 8.95  9.06  9.14   Sodium 135 - 145 mmol/L 136  139  139   Potassium 3.5 - 5.1 mmol/L 3.8  4.0  3.8   Chloride 98 - 111 mmol/L 103  102  104   CO2 22 - 32 mmol/L 23  27  27    Calcium  8.9 - 10.3 mg/dL 9.1  9.6  9.3   Total Protein 6.5 - 8.1 g/dL 7.1  6.8  7.1   Total Bilirubin 0.0 - 1.2 mg/dL 1.5  1.6  1.6   Alkaline Phos 38 - 126 U/L 59  58  65   AST 15 - 41 U/L 27  14  17    ALT 0 - 44 U/L 14  10  13       No results found for: FERRITIN, VITAMINB12  Vitals:   11/29/23 1135  BP: (!) 144/74  Pulse: 76  Resp: 19  Temp: 98.6 F (37 C)  SpO2: 99%    Review of System:  Review of Systems  All other systems reviewed and are negative.   Physical Exam: Physical Exam Constitutional:      Appearance: Normal appearance.  HENT:     Head: Normocephalic and atraumatic.  Eyes:     Pupils: Pupils are equal, round, and reactive to light.  Cardiovascular:     Rate  and Rhythm: Normal rate and regular rhythm.     Heart sounds: Normal heart sounds. No murmur heard. Pulmonary:     Effort: Pulmonary effort is normal.     Breath sounds: Normal breath sounds. No wheezing.  Abdominal:     General: Bowel sounds are normal. There is no distension.     Palpations: Abdomen is soft.     Tenderness: There is no abdominal tenderness.  Musculoskeletal:        General: Normal range of motion.     Cervical back: Normal range of motion.  Skin:    General: Skin is warm and dry.     Findings: No rash.  Neurological:     Mental Status: She is alert and oriented to person, place, and time.     Gait: Gait is intact.  Psychiatric:        Mood and Affect: Mood and affect normal.        Cognition and Memory: Memory normal.        Judgment: Judgment normal.      I spent 20 minutes dedicated to the care of this patient (face-to-face and non-face-to-face) on the date of the encounter to include what is described in the assessment and plan.,  Delon Hope, NP 11/29/2023 12:40 PM

## 2023-11-29 NOTE — Assessment & Plan Note (Addendum)
#  Left breast atypical ductal hyperplasia [January 2021-Arimidex  ]currently on antihormone therapy with armidex 1mg /day. MARCH 2025- Mammo-WNL.   Tolerating well without any major side effects. No mammographic evidence of malignancy.  Discussed that she would need to be on AI for total of 5 years until January 2026. Repeat mammogram in 2026 March.  Orders placed.

## 2023-12-15 ENCOUNTER — Other Ambulatory Visit: Payer: Self-pay | Admitting: Nurse Practitioner

## 2023-12-18 NOTE — Telephone Encounter (Signed)
 Rx ok'd for ditropan .

## 2023-12-21 ENCOUNTER — Encounter: Payer: Self-pay | Admitting: Sleep Medicine

## 2023-12-22 ENCOUNTER — Encounter: Payer: Self-pay | Admitting: Internal Medicine

## 2023-12-22 MED ORDER — ESTRADIOL 0.01 % VA CREA: TOPICAL_CREAM | VAGINAL | 1 refills | Status: AC

## 2023-12-22 NOTE — Telephone Encounter (Signed)
 Rx sent in for estrace cream.

## 2023-12-24 ENCOUNTER — Other Ambulatory Visit (HOSPITAL_COMMUNITY): Payer: Self-pay

## 2023-12-24 ENCOUNTER — Telehealth: Payer: Self-pay

## 2023-12-24 DIAGNOSIS — G4733 Obstructive sleep apnea (adult) (pediatric): Secondary | ICD-10-CM

## 2023-12-24 NOTE — Telephone Encounter (Signed)
*  [facility]  Pharmacy Patient Advocate Encounter   Received notification from Patient Advice Request messages that prior authorization for Zepbound  2.5mg  is required/requested.   Insurance verification completed.   The patient is insured through [place].   Per test claim: Insurance companies are becoming increasingly stricter about requiring thorough documentation of lifestyle modifications in the patient's chart at each visit. This includes detailed records of diet recommendations (caloric intake, etc), exercise plans (amount of time/wk, etc), and an emphasis on the patient's commitment to continuing these efforts while on medication.  Without this additional documentation in the chart notes, a prior authorization will most likely be denied.  CMM Key:  [id number]

## 2023-12-29 DIAGNOSIS — G4733 Obstructive sleep apnea (adult) (pediatric): Secondary | ICD-10-CM | POA: Diagnosis not present

## 2023-12-31 NOTE — Telephone Encounter (Signed)
 Sticky note reminder placed on provider's desk.

## 2024-01-31 NOTE — Addendum Note (Signed)
 Addended by: Margretta Zamorano J on: 01/31/2024 08:17 AM   Modules accepted: Orders

## 2024-03-29 ENCOUNTER — Institutional Professional Consult (permissible substitution) (INDEPENDENT_AMBULATORY_CARE_PROVIDER_SITE_OTHER): Admitting: Adult Health

## 2024-04-24 ENCOUNTER — Ambulatory Visit

## 2024-05-29 ENCOUNTER — Ambulatory Visit: Admitting: Internal Medicine

## 2024-06-12 ENCOUNTER — Inpatient Hospital Stay

## 2024-06-12 ENCOUNTER — Inpatient Hospital Stay: Admitting: Internal Medicine

## 2024-11-13 ENCOUNTER — Encounter: Admitting: Internal Medicine

## 2024-11-15 ENCOUNTER — Ambulatory Visit
